# Patient Record
Sex: Female | Born: 1935 | Race: White | Hispanic: No | Smoking: Never smoker
Health system: Southern US, Community
[De-identification: ages and names within clinical notes are randomized; demographics above are authoritative.]

## PROBLEM LIST (undated history)

## (undated) DIAGNOSIS — I10 Essential (primary) hypertension: Secondary | ICD-10-CM

## (undated) DIAGNOSIS — R0602 Shortness of breath: Secondary | ICD-10-CM

## (undated) DIAGNOSIS — I639 Cerebral infarction, unspecified: Secondary | ICD-10-CM

## (undated) DIAGNOSIS — K219 Gastro-esophageal reflux disease without esophagitis: Secondary | ICD-10-CM

## (undated) DIAGNOSIS — I2510 Atherosclerotic heart disease of native coronary artery without angina pectoris: Secondary | ICD-10-CM

## (undated) DIAGNOSIS — R079 Chest pain, unspecified: Secondary | ICD-10-CM

## (undated) HISTORY — DX: Chest pain, unspecified: R07.9

## (undated) HISTORY — PX: OTHER SURGICAL HISTORY: SHX169

## (undated) HISTORY — PX: ABDOMINAL HYSTERECTOMY: SHX81

## (undated) HISTORY — PX: ESOPHAGOGASTRODUODENOSCOPY: SHX1529

---

## 2011-09-05 DIAGNOSIS — H3530 Unspecified macular degeneration: Secondary | ICD-10-CM | POA: Diagnosis not present

## 2011-09-05 DIAGNOSIS — H353190 Nonexudative age-related macular degeneration, unspecified eye, stage unspecified: Secondary | ICD-10-CM | POA: Diagnosis not present

## 2011-09-24 DIAGNOSIS — J069 Acute upper respiratory infection, unspecified: Secondary | ICD-10-CM | POA: Diagnosis not present

## 2011-09-24 DIAGNOSIS — R197 Diarrhea, unspecified: Secondary | ICD-10-CM | POA: Diagnosis not present

## 2011-09-25 DIAGNOSIS — R197 Diarrhea, unspecified: Secondary | ICD-10-CM | POA: Diagnosis not present

## 2011-10-02 DIAGNOSIS — R197 Diarrhea, unspecified: Secondary | ICD-10-CM | POA: Diagnosis not present

## 2011-10-09 ENCOUNTER — Other Ambulatory Visit: Payer: Self-pay

## 2011-10-09 ENCOUNTER — Inpatient Hospital Stay (HOSPITAL_COMMUNITY)
Admission: EM | Admit: 2011-10-09 | Discharge: 2011-10-13 | DRG: 378 | Disposition: A | Discharge status: Skilled Nursing Facility | Payer: Medicare Other | Attending: Internal Medicine | Admitting: Internal Medicine

## 2011-10-09 ENCOUNTER — Encounter (HOSPITAL_COMMUNITY): Payer: Self-pay

## 2011-10-09 DIAGNOSIS — K922 Gastrointestinal hemorrhage, unspecified: Principal | ICD-10-CM | POA: Diagnosis present

## 2011-10-09 DIAGNOSIS — D72829 Elevated white blood cell count, unspecified: Secondary | ICD-10-CM | POA: Diagnosis present

## 2011-10-09 DIAGNOSIS — R1110 Vomiting, unspecified: Secondary | ICD-10-CM | POA: Diagnosis present

## 2011-10-09 DIAGNOSIS — R112 Nausea with vomiting, unspecified: Secondary | ICD-10-CM | POA: Diagnosis not present

## 2011-10-09 DIAGNOSIS — K222 Esophageal obstruction: Secondary | ICD-10-CM | POA: Diagnosis present

## 2011-10-09 DIAGNOSIS — R5383 Other fatigue: Secondary | ICD-10-CM | POA: Diagnosis not present

## 2011-10-09 DIAGNOSIS — R262 Difficulty in walking, not elsewhere classified: Secondary | ICD-10-CM | POA: Diagnosis not present

## 2011-10-09 DIAGNOSIS — E871 Hypo-osmolality and hyponatremia: Secondary | ICD-10-CM | POA: Diagnosis present

## 2011-10-09 DIAGNOSIS — D649 Anemia, unspecified: Secondary | ICD-10-CM | POA: Diagnosis not present

## 2011-10-09 DIAGNOSIS — R0602 Shortness of breath: Secondary | ICD-10-CM | POA: Diagnosis not present

## 2011-10-09 DIAGNOSIS — Z860100 Personal history of colon polyps, unspecified: Secondary | ICD-10-CM

## 2011-10-09 DIAGNOSIS — D473 Essential (hemorrhagic) thrombocythemia: Secondary | ICD-10-CM | POA: Diagnosis present

## 2011-10-09 DIAGNOSIS — Z9181 History of falling: Secondary | ICD-10-CM | POA: Diagnosis not present

## 2011-10-09 DIAGNOSIS — R197 Diarrhea, unspecified: Secondary | ICD-10-CM | POA: Diagnosis present

## 2011-10-09 DIAGNOSIS — R059 Cough, unspecified: Secondary | ICD-10-CM | POA: Diagnosis not present

## 2011-10-09 DIAGNOSIS — I10 Essential (primary) hypertension: Secondary | ICD-10-CM | POA: Diagnosis present

## 2011-10-09 DIAGNOSIS — I2510 Atherosclerotic heart disease of native coronary artery without angina pectoris: Secondary | ICD-10-CM | POA: Diagnosis present

## 2011-10-09 DIAGNOSIS — Z66 Do not resuscitate: Secondary | ICD-10-CM | POA: Diagnosis present

## 2011-10-09 DIAGNOSIS — Z5189 Encounter for other specified aftercare: Secondary | ICD-10-CM | POA: Diagnosis not present

## 2011-10-09 DIAGNOSIS — R079 Chest pain, unspecified: Secondary | ICD-10-CM | POA: Diagnosis not present

## 2011-10-09 DIAGNOSIS — K921 Melena: Secondary | ICD-10-CM | POA: Diagnosis not present

## 2011-10-09 DIAGNOSIS — I498 Other specified cardiac arrhythmias: Secondary | ICD-10-CM | POA: Diagnosis present

## 2011-10-09 DIAGNOSIS — M6281 Muscle weakness (generalized): Secondary | ICD-10-CM | POA: Diagnosis not present

## 2011-10-09 DIAGNOSIS — R799 Abnormal finding of blood chemistry, unspecified: Secondary | ICD-10-CM

## 2011-10-09 DIAGNOSIS — D75839 Thrombocytosis, unspecified: Secondary | ICD-10-CM | POA: Diagnosis present

## 2011-10-09 DIAGNOSIS — R5381 Other malaise: Secondary | ICD-10-CM | POA: Diagnosis not present

## 2011-10-09 DIAGNOSIS — K219 Gastro-esophageal reflux disease without esophagitis: Secondary | ICD-10-CM | POA: Diagnosis present

## 2011-10-09 DIAGNOSIS — R531 Weakness: Secondary | ICD-10-CM | POA: Diagnosis present

## 2011-10-09 DIAGNOSIS — D62 Acute posthemorrhagic anemia: Secondary | ICD-10-CM | POA: Diagnosis present

## 2011-10-09 DIAGNOSIS — E785 Hyperlipidemia, unspecified: Secondary | ICD-10-CM | POA: Diagnosis present

## 2011-10-09 DIAGNOSIS — R195 Other fecal abnormalities: Secondary | ICD-10-CM | POA: Diagnosis not present

## 2011-10-09 DIAGNOSIS — I2541 Coronary artery aneurysm: Secondary | ICD-10-CM | POA: Diagnosis not present

## 2011-10-09 HISTORY — DX: Shortness of breath: R06.02

## 2011-10-09 HISTORY — DX: Atherosclerotic heart disease of native coronary artery without angina pectoris: I25.10

## 2011-10-09 HISTORY — DX: Essential (primary) hypertension: I10

## 2011-10-09 HISTORY — DX: Gastro-esophageal reflux disease without esophagitis: K21.9

## 2011-10-09 LAB — CBC
HCT: 34 % — ABNORMAL LOW (ref 36.0–46.0)
Hemoglobin: 11.1 g/dL — ABNORMAL LOW (ref 12.0–15.0)
Hemoglobin: 8.6 g/dL — ABNORMAL LOW (ref 12.0–15.0)
MCH: 30.2 pg (ref 26.0–34.0)
MCH: 29.9 pg (ref 26.0–34.0)
MCHC: 32.6 g/dL (ref 30.0–36.0)
MCHC: 32.7 g/dL (ref 30.0–36.0)
MCV: 92.4 fL (ref 78.0–100.0)
MCV: 91.3 fL (ref 78.0–100.0)
Platelets: 542 10*3/uL — ABNORMAL HIGH (ref 150–400)
Platelets: 474 10*3/uL — ABNORMAL HIGH (ref 150–400)
RBC: 3.68 MIL/uL — ABNORMAL LOW (ref 3.87–5.11)
RBC: 2.88 MIL/uL — ABNORMAL LOW (ref 3.87–5.11)
RDW: 14.5 % (ref 11.5–15.5)
WBC: 16.5 10*3/uL — ABNORMAL HIGH (ref 4.0–10.5)

## 2011-10-09 LAB — COMPREHENSIVE METABOLIC PANEL
ALT: 9 U/L (ref 0–35)
AST: 14 U/L (ref 0–37)
Albumin: 3.1 g/dL — ABNORMAL LOW (ref 3.5–5.2)
Alkaline Phosphatase: 57 U/L (ref 39–117)
BUN: 50 mg/dL — ABNORMAL HIGH (ref 6–23)
CO2: 22 mEq/L (ref 19–32)
Calcium: 9.1 mg/dL (ref 8.4–10.5)
Chloride: 97 mEq/L (ref 96–112)
Creatinine, Ser: 0.83 mg/dL (ref 0.50–1.10)
GFR calc Af Amer: 74 mL/min — ABNORMAL LOW (ref >90)
GFR calc non Af Amer: 63 mL/min — ABNORMAL LOW (ref >90)
Glucose, Bld: 111 mg/dL — ABNORMAL HIGH (ref 70–99)
Potassium: 4.5 mEq/L (ref 3.5–5.1)
Sodium: 131 mEq/L — ABNORMAL LOW (ref 135–145)
Total Bilirubin: 0.2 mg/dL — ABNORMAL LOW (ref 0.3–1.2)
Total Protein: 6.6 g/dL (ref 6.0–8.3)

## 2011-10-09 LAB — URINALYSIS, ROUTINE W REFLEX MICROSCOPIC
Bilirubin Urine: NEGATIVE
Glucose, UA: NEGATIVE mg/dL
Ketones, ur: NEGATIVE mg/dL
Nitrite: NEGATIVE
Protein, ur: NEGATIVE mg/dL
Specific Gravity, Urine: 1.012 (ref 1.005–1.030)
Urobilinogen, UA: 0.2 mg/dL (ref 0.0–1.0)
pH: 6 (ref 5.0–8.0)

## 2011-10-09 LAB — ABO/RH: ABO/RH(D): A POS

## 2011-10-09 LAB — URINE MICROSCOPIC-ADD ON

## 2011-10-09 LAB — CARDIAC PANEL(CRET KIN+CKTOT+MB+TROPI): Troponin I: <0.3 ng/mL (ref <0.30)

## 2011-10-09 MED ORDER — BIOTENE DRY MOUTH MT LIQD
15.0000 mL | Freq: Two times a day (BID) | OROMUCOSAL | Status: DC
  Administered 2011-10-09 – 2011-10-13 (×8): 15 mL via OROMUCOSAL

## 2011-10-09 MED ORDER — ALPRAZOLAM 0.25 MG PO TABS
0.2500 mg | ORAL_TABLET | Freq: Three times a day (TID) | ORAL | Status: DC | PRN
  Administered 2011-10-11 – 2011-10-12 (×2): 0.25 mg via ORAL
  Filled 2011-10-09 (×2): qty 1

## 2011-10-09 MED ORDER — ESCITALOPRAM OXALATE 20 MG PO TABS
20.0000 mg | ORAL_TABLET | Freq: Every morning | ORAL | Status: DC
  Administered 2011-10-11 – 2011-10-13 (×2): 20 mg via ORAL
  Filled 2011-10-09 (×4): qty 1

## 2011-10-09 MED ORDER — METOPROLOL TARTRATE 1 MG/ML IV SOLN
2.5000 mg | Freq: Once | INTRAVENOUS | Status: AC
  Administered 2011-10-09: 2.5 mg via INTRAVENOUS
  Filled 2011-10-09: qty 5

## 2011-10-09 MED ORDER — SODIUM CHLORIDE 0.9 % IV SOLN
INTRAVENOUS | Status: DC
  Administered 2011-10-10 – 2011-10-12 (×3): via INTRAVENOUS

## 2011-10-09 MED ORDER — NITROGLYCERIN 0.4 MG SL SUBL
0.4000 mg | SUBLINGUAL_TABLET | SUBLINGUAL | Status: DC | PRN
  Administered 2011-10-09: 0.4 mg via SUBLINGUAL

## 2011-10-09 MED ORDER — SIMVASTATIN 20 MG PO TABS
20.0000 mg | ORAL_TABLET | Freq: Every evening | ORAL | Status: DC
  Administered 2011-10-09: 20 mg via ORAL
  Filled 2011-10-09: qty 1

## 2011-10-09 MED ORDER — METOPROLOL TARTRATE 25 MG PO TABS
75.0000 mg | ORAL_TABLET | Freq: Every day | ORAL | Status: DC
Start: 2011-10-09 — End: 2011-10-09
  Administered 2011-10-09: 75 mg via ORAL
  Filled 2011-10-09: qty 1

## 2011-10-09 MED ORDER — NITROGLYCERIN 0.4 MG SL SUBL
SUBLINGUAL_TABLET | SUBLINGUAL | Status: AC
  Filled 2011-10-09: qty 25

## 2011-10-09 MED ORDER — ISOSORBIDE MONONITRATE ER 60 MG PO TB24
60.0000 mg | ORAL_TABLET | Freq: Every day | ORAL | Status: DC
  Administered 2011-10-09: 60 mg via ORAL
  Filled 2011-10-09 (×2): qty 1

## 2011-10-09 MED ORDER — DILTIAZEM HCL ER COATED BEADS 120 MG PO CP24
120.0000 mg | ORAL_CAPSULE | Freq: Every day | ORAL | Status: DC
  Administered 2011-10-09: 120 mg via ORAL
  Filled 2011-10-09 (×2): qty 1

## 2011-10-09 MED ORDER — SODIUM CHLORIDE 0.9 % IV SOLN
8.0000 mg/h | INTRAVENOUS | Status: DC
  Administered 2011-10-09 – 2011-10-11 (×4): 8 mg/h via INTRAVENOUS
  Filled 2011-10-09 (×9): qty 80

## 2011-10-09 MED ORDER — TIMOLOL MALEATE 0.5 % OP SOLG
1.0000 [drp] | Freq: Every day | OPHTHALMIC | Status: DC
  Administered 2011-10-09 – 2011-10-13 (×4): 1 [drp] via OPHTHALMIC
  Filled 2011-10-09: qty 5

## 2011-10-09 MED ORDER — ALUM & MAG HYDROXIDE-SIMETH 200-200-20 MG/5ML PO SUSP: 30.0000 mL | Freq: Four times a day (QID) | ORAL | Status: DC | PRN

## 2011-10-09 MED ORDER — CYCLOBENZAPRINE HCL 10 MG PO TABS
10.0000 mg | ORAL_TABLET | Freq: Three times a day (TID) | ORAL | Status: DC | PRN
  Administered 2011-10-11 – 2011-10-13 (×2): 10 mg via ORAL
  Filled 2011-10-09 (×2): qty 1

## 2011-10-09 MED ORDER — METOPROLOL TARTRATE 50 MG PO TABS
50.0000 mg | ORAL_TABLET | Freq: Two times a day (BID) | ORAL | Status: DC
  Filled 2011-10-09 (×2): qty 1

## 2011-10-09 MED ORDER — PANTOPRAZOLE SODIUM 40 MG IV SOLR
40.0000 mg | Freq: Once | INTRAVENOUS | Status: AC
  Administered 2011-10-09: 40 mg via INTRAVENOUS
  Filled 2011-10-09: qty 40

## 2011-10-09 MED ORDER — MORPHINE SULFATE 2 MG/ML IJ SOLN
2.0000 mg | INTRAMUSCULAR | Status: DC | PRN
  Administered 2011-10-11: 2 mg via INTRAVENOUS
  Filled 2011-10-09 (×3): qty 1

## 2011-10-09 MED ORDER — SODIUM CHLORIDE 0.9 % IV BOLUS (SEPSIS)
1000.0000 mL | Freq: Once | INTRAVENOUS | Status: AC
  Administered 2011-10-09: 1000 mL via INTRAVENOUS

## 2011-10-09 MED ORDER — MORPHINE SULFATE 2 MG/ML IJ SOLN
INTRAMUSCULAR | Status: AC
  Administered 2011-10-09: 2 mg via INTRAVENOUS
  Filled 2011-10-09: qty 1

## 2011-10-09 MED ORDER — ACETAMINOPHEN 650 MG RE SUPP: 650.0000 mg | Freq: Four times a day (QID) | RECTAL | Status: DC | PRN

## 2011-10-09 MED ORDER — ONDANSETRON HCL 4 MG/2ML IJ SOLN: 4.0000 mg | Freq: Four times a day (QID) | INTRAMUSCULAR | Status: DC | PRN

## 2011-10-09 MED ORDER — SODIUM CHLORIDE 0.9 % IJ SOLN
3.0000 mL | Freq: Two times a day (BID) | INTRAMUSCULAR | Status: DC
  Administered 2011-10-11 (×2): 3 mL via INTRAVENOUS

## 2011-10-09 MED ORDER — ATORVASTATIN CALCIUM 10 MG PO TABS
10.0000 mg | ORAL_TABLET | Freq: Every day | ORAL | Status: DC
  Administered 2011-10-10 – 2011-10-12 (×3): 10 mg via ORAL
  Filled 2011-10-09 (×4): qty 1

## 2011-10-09 MED ORDER — ONDANSETRON HCL 4 MG PO TABS
4.0000 mg | ORAL_TABLET | Freq: Four times a day (QID) | ORAL | Status: DC | PRN
  Administered 2011-10-10: 4 mg via ORAL
  Filled 2011-10-09: qty 1

## 2011-10-09 MED ORDER — TRAVOPROST 0.004 % OP SOLN: 1.0000 [drp] | Freq: Every day | OPHTHALMIC | Status: DC

## 2011-10-09 MED ORDER — ALLOPURINOL 100 MG PO TABS
100.0000 mg | ORAL_TABLET | Freq: Every day | ORAL | Status: DC
  Administered 2011-10-09 – 2011-10-12 (×4): 100 mg via ORAL
  Filled 2011-10-09 (×5): qty 1

## 2011-10-09 MED ORDER — ACETAMINOPHEN 325 MG PO TABS
650.0000 mg | ORAL_TABLET | Freq: Four times a day (QID) | ORAL | Status: DC | PRN
  Administered 2011-10-10 – 2011-10-13 (×4): 650 mg via ORAL
  Filled 2011-10-09 (×4): qty 2

## 2011-10-09 MED ORDER — TRAVOPROST (BAK FREE) 0.004 % OP SOLN
1.0000 [drp] | Freq: Every day | OPHTHALMIC | Status: DC
  Administered 2011-10-09 – 2011-10-12 (×4): 1 [drp] via OPHTHALMIC
  Filled 2011-10-09: qty 2.5

## 2011-10-09 NOTE — ED Notes (Signed)
REPORT CALLED TO JESSE 

## 2011-10-09 NOTE — ED Notes (Signed)
Pt reports dark tarry stools x2 months, black diarrhea x2 wks, black vomitus this am, and generalized weakness x2 months, pt reports seeing her pcp today and instructed to come here for dehydration. Pt denies any chest pain or sob

## 2011-10-09 NOTE — Progress Notes (Signed)
Observation review is complete. 

## 2011-10-09 NOTE — ED Notes (Signed)
Patient placed on monitor and is resting with NAD at this time.

## 2011-10-09 NOTE — Progress Notes (Signed)
PHARMACIST - PHYSICIAN COMMUNICATION DR:   Marlin Canary CONCERNING:  Zocor (Simvastatin) 20mg  daily and Diltiazem. (on PTA)  DESCRIPTION: Patients on diltiazem and simvastatin >10 mg/day have reported cases of rhabdomyolysis.    RECOMMENDATION: Per [facility] Formulary and Pharmacy and Therapeutics Committee approved substitution policy we are substituting Atorvastatin (Lipitor) 10 mg for Zocor 20mg .  Please consider this substituition at discharge as patient was taking Zocor 20mg  and Diltiazem prior to admission.   Noah Delaine, RPh Clinical Pharmacist 10/09/2011, 21:11

## 2011-10-09 NOTE — ED Provider Notes (Signed)
History    78yF with diarrhea and vomiting. Patient states that she's had diarrhea for at least several weeks which has been black and in the words of her son "tarry" appearing. Patient also with black emesis today as well. Patient is feeling a little bit dizzy and generally weak. Denies abdominal pain. No fevers or chills. No shortness of breath. Patient has been evaluated by her PCP for these complaints. Has been taking Imodium without much change. Patient was on omeprazole that was recently started this is felt to might be contributing to her diarrhea. No history GI bleed that the patient is aware of.  CSN: [id number]  Arrival date & time 10/09/11  1305   First MD Initiated Contact with Patient 10/09/11 1428      Chief Complaint  Patient presents with  . Dehydration    Chronic diarrhea ,black . Pt reports vomiting  black colored liquid to day    (Consider location/radiation/quality/duration/timing/severity/associated sxs/prior treatment) HPI  No past medical history on file.  No past surgical history on file.  No family history on file.  History  Substance Use Topics  . Smoking status: Not on file  . Smokeless tobacco: Not on file  . Alcohol Use: Not on file    OB History    No data available      Review of Systems   Review of symptoms negative unless otherwise noted in HPI.   Allergies  Review of patient's allergies indicates no known allergies.  Home Medications   Current Outpatient Rx  Name Route Sig Dispense Refill  . ALLOPURINOL 100 MG PO TABS Oral Take 100 mg by mouth at bedtime.    . ALPRAZOLAM 0.25 MG PO TABS Oral Take 0.25 mg by mouth 3 (three) times daily as needed. As needed for anxiety.    . ASPIRIN 325 MG PO TABS Oral Take 325 mg by mouth daily at 12 noon.    Marland Kitchen CALCIUM CARBONATE-VITAMIN D 600-200 MG-UNIT PO TABS Oral Take 1 tablet by mouth 2 (two) times daily.    . CYCLOBENZAPRINE HCL 10 MG PO TABS Oral Take 10 mg by mouth 3 (three) times daily as  needed. As needed for anxiety.    Marland Kitchen DILTIAZEM HCL ER COATED BEADS 120 MG PO CP24 Oral Take 120 mg by mouth daily at 12 noon.    Marland Kitchen ESCITALOPRAM OXALATE 20 MG PO TABS Oral Take 20 mg by mouth every morning.    . ISOSORBIDE MONONITRATE ER 60 MG PO TB24 Oral Take 60 mg by mouth daily at 12 noon.    . LUTEIN 20 MG PO TABS Oral Take 1 tablet by mouth every morning.    Marland Kitchen METOPROLOL TARTRATE 50 MG PO TABS Oral Take 75 mg by mouth daily at 12 noon.    . CENTRUM SILVER PO Oral Take 1 tablet by mouth daily at 12 noon.    Marland Kitchen NITROGLYCERIN 0.4 MG SL SUBL Sublingual Place 0.4 mg under the tongue every 5 (five) minutes as needed. As needed for chest pain.    Marland Kitchen PANTOPRAZOLE SODIUM 40 MG PO TBEC Oral Take 40 mg by mouth daily.    Marland Kitchen SIMVASTATIN 20 MG PO TABS Oral Take 20 mg by mouth every evening.    Marland Kitchen TIMOLOL MALEATE 0.5 % OP SOLG Both Eyes Place 1 drop into both eyes daily.    . TRAVOPROST 0.004 % OP SOLN Both Eyes Place 1 drop into both eyes at bedtime.    . TRAZODONE HCL 50 MG PO  TABS Oral Take 150 mg by mouth at bedtime.      BP 149/73  Pulse 118  Temp(Src) 97.2 F (36.2 C) (Oral)  Resp 16  SpO2 99%  Physical Exam  Nursing note and vitals reviewed. Constitutional: She appears well-developed and well-nourished. No distress.       In bed. No acute distress.  HENT:  Head: Normocephalic and atraumatic.  Eyes: Conjunctivae are normal. Pupils are equal, round, and reactive to light. Right eye exhibits no discharge. Left eye exhibits no discharge.  Neck: Neck supple.  Cardiovascular: Regular rhythm and normal heart sounds.  Exam reveals no gallop and no friction rub.   No murmur heard.      Tachycardic with a regular rhythm  Pulmonary/Chest: Effort normal and breath sounds normal. No respiratory distress.  Abdominal: Soft. She exhibits no distension. There is no tenderness.       No tenderness. No distention. No masses palpated  Musculoskeletal: She exhibits no edema and no tenderness.  Neurological:  She is alert.  Skin: Skin is warm and dry. She is not diaphoretic.  Psychiatric: She has a normal mood and affect. Her behavior is normal. Thought content normal.    ED Course  Procedures (including critical care time)  Labs Reviewed - No data to display No results found.   1. Melena   2. Elevated BUN   3. Diarrhea       MDM  78 year old female with diarrhea and emesis. Symptoms concerning for ongoing bleeding. Patient is mildly tachycardic. BUN is markedly elevated possibly secondary to GI bleed and/or complaint dehydration. IV fluids. Type and screen. Admission for further evaluation.        Raeford Razor, MD 10/11/11 [phone]

## 2011-10-09 NOTE — ED Notes (Signed)
Diarrhea now black and has vomited Black liquid. Pt took 6  Imodium  With out relief.

## 2011-10-09 NOTE — Progress Notes (Signed)
Called by RN to inform me that patient complaining of 4/10 chest pain and HR going up to 150 possible SVT vs A.flutter with block. Spoke to family her son stated he wants no life prolonging measures but concentrating on conservative approach. Patient is to be DNR/DNI. He did not want any aggressive procedures done. Will give morphine and attempt to improve heart rate with lopressor IV. For now would avoid adenosine as per family wishes.  After lopressor 2.5 IV HR  went down to 104, will continue to monitor. Changed scheduled lopressor to 50 BID from 75 qd.  CE pending but given patient disposition and family wishes if CE are positive will trend them but avoid aggressive interventions.  Chart including problem list, medications, labs and vitals were reviewed  Tura Roller 8:22 PM

## 2011-10-09 NOTE — ED Notes (Signed)
0454-09 Ready

## 2011-10-09 NOTE — H&P (Addendum)
Patient's PCP: Elby Showers, MD, MD  Chief Complaint: weakness/diarrhea (chronic)  History of Present Illness: Ellen Marshall is a 78 y.o. white female from [facility] who follows with Dr. Clent Ridges but has been seeing Dr. Denton Lank and Dr. Earl Gala recently.  She recently moved here from out of town and son is at the bedside to add history.  For months patient has had diarrhea (occasionaly black in nature).  She has been seen by her PCP who suspected microscopic colitis but no biopsy has been done.   Had colonoscopy done 6 years ago with nothing remarkable. She also has a long history of esophageal stricture requiring stretching.  Her PCP has been working it up her diarrhea as an outpatient, protonix was stopped about a week ago as it was suspected to contribute to her diarrhea.  She denies history of ulcer or other GI bleed.    Again, she presents with diarrhea (chronic) black in nature for months, and vomiting/regurgitation of food for 1 day.    Weakness seems to be the most important problem to family and this has been worsening over the last few months.  Patient is now ambulating with walker but not very active.      Meds: Scheduled Meds:    . pantoprazole (PROTONIX) IV  40 mg Intravenous Once  . sodium chloride  1,000 mL Intravenous Once   Continuous Infusions:    . pantoprozole (PROTONIX) infusion 8 mg/hr (10/09/11 1710)   PRN Meds:. Allergies: Review of patient's allergies indicates no known allergies. Past Medical History  Diagnosis Date  . Coronary artery disease    Past Surgical History  Procedure Date  . Colonscopy   . Esophagogastroduodenoscopy   . Stretching of egd    No family history on file. History   Social History  . Marital Status: Widowed    Spouse Name: N/A    Number of Children: N/A  . Years of Education: N/A   Occupational History  . Not on file.   Social History Main Topics  . Smoking status: Never Smoker   . Smokeless tobacco: Not on file    . Alcohol Use: No  . Drug Use: No  . Sexually Active:    Other Topics Concern  . Not on file   Social History Narrative  . No narrative on file   Review of Systems: All systems reviewed with the patient and positive as per history of present illness, otherwise all other systems are negative.   Physical Exam: Blood pressure 145/101, pulse 111, temperature 97.5 F (36.4 C), temperature source Oral, resp. rate 23, SpO2 99.00%. General: Awake, Oriented x3, No acute distress, elderly HEENT: EOMI, dry mucous membranes Neck: Supple CV: S1 and S2, tachy Lungs: Clear to ascultation bilaterally, no wheezing Abdomen: Soft, Nontender, Nondistended, +bowel sounds. Ext: Good pulses. Trace edema. No clubbing or cyanosis noted. Neuro: Cranial Nerves II-XII grossly intact. generalized weakness    Lab results:  Basename 10/09/11 1443  NA 131*  K 4.5  CL 97  CO2 22  GLUCOSE 111*  BUN 50*  CREATININE 0.83  CALCIUM 9.1  MG --  PHOS --    Basename 10/09/11 1443  AST 14  ALT 9  ALKPHOS 57  BILITOT 0.2*  PROT 6.6  ALBUMIN 3.1*   No results found for this basename: LIPASE:2,AMYLASE:2 in the last 72 hours  Basename 10/09/11 1443  WBC 16.5*  NEUTROABS --  HGB 11.1*  HCT 34.0*  MCV 92.4  PLT 542*   No results found  for this basename: CKTOTAL:3,CKMB:3,CKMBINDEX:3,TROPONINI:3 in the last 72 hours No components found with this basename: POCBNP:3 No results found for this basename: DDIMER in the last 72 hours No results found for this basename: HGBA1C:2 in the last 72 hours No results found for this basename: CHOL:2,HDL:2,LDLCALC:2,TRIG:2,CHOLHDL:2,LDLDIRECT:2 in the last 72 hours No results found for this basename: TSH,T4TOTAL,FREET3,T3FREE,THYROIDAB in the last 72 hours No results found for this basename: VITAMINB12:2,FOLATE:2,FERRITIN:2,TIBC:2,IRON:2,RETICCTPCT:2 in the last 72 hours  Imaging results:  No results found.  Other results: EKG: sinus tach  Assessment &  Plan by Problem:   *Weakness- await U/A to rule out UTI, consult PT  ?GI bleed- send stool for heme, CBC q 12 hours, consult GI, has has black diarrhea on and off for years   Hyponatremia- ? Dehydration   Diarrhea- chronic issue that has been worked up as an outpatient, will consult GI   Vomiting- zofran PRN, sounds more like regurgitation/gagging   Leukocytosis- ? Etiology, wait U/A   Thrombocythemia- watch   GERD (gastroesophageal reflux disease)- protonix   CAD (coronary artery disease)- stable ASA/? plavix (not listed on home meds)  Tachycardia- IVF   Code status: no intubation but CPR ok (no feeding tubes)  Time spent on admission, talking to the patient, and coordinating care was: 59 mins.  Doniqua Saxby, DO 10/09/2011, 5:24 PM

## 2011-10-09 NOTE — Progress Notes (Signed)
Patient's EKG showed SVT HR 152 that was obtained at 1950. Dr. Adela Glimpse notified. Will continue to monitor patient. Nelda Marseille, RN

## 2011-10-09 NOTE — ED Provider Notes (Signed)
3:53 PM  Date: 10/09/2011  Rate: 114  Rhythm: sinus tachycardia  QRS Axis: left  Intervals: normal QRS:  Poor R wave progression in precordial leads suggests possible old anterior myocardial infarction.  ST/T Wave abnormalities: normal  Conduction Disutrbances:none  Narrative Interpretation: Abnormal EKG.  Old EKG Reviewed: none available    Carleene Cooper III, MD 10/09/11 [phone]

## 2011-10-09 NOTE — Progress Notes (Signed)
Patient's HR in the 150's sustained at 1950. Rapid response RN, Wes at bedside.  Wes, RN notified Dr. Adela Glimpse of situation. Patient stated that she reached down to take pants off.  Patient having chest pain.Gave one nitro subliginal. No chest pain after nitro.   Dr. Adela Glimpse stated that she would come look at patient.  Patient having chest pain again at 4 and problems breathing when Dr. Adela Glimpse arrived. Put patient on 2L of O2.  Dr. Adela Glimpse gave order for Metoprol 2.5mg  IV and Morphine 2mg  IV.  Patient having no pain now and HR is 105 on tele. Will continue to monitor patient. Nelda Marseille, RN

## 2011-10-10 ENCOUNTER — Encounter (HOSPITAL_COMMUNITY): Payer: Self-pay | Admitting: *Deleted

## 2011-10-10 ENCOUNTER — Encounter (HOSPITAL_COMMUNITY)
Admission: EM | Disposition: A | Discharge status: Skilled Nursing Facility | Payer: Self-pay | Source: Home / Self Care | Attending: Internal Medicine

## 2011-10-10 DIAGNOSIS — R197 Diarrhea, unspecified: Secondary | ICD-10-CM | POA: Diagnosis not present

## 2011-10-10 DIAGNOSIS — I2510 Atherosclerotic heart disease of native coronary artery without angina pectoris: Secondary | ICD-10-CM | POA: Diagnosis not present

## 2011-10-10 DIAGNOSIS — E871 Hypo-osmolality and hyponatremia: Secondary | ICD-10-CM | POA: Diagnosis not present

## 2011-10-10 DIAGNOSIS — K921 Melena: Secondary | ICD-10-CM | POA: Diagnosis not present

## 2011-10-10 DIAGNOSIS — D62 Acute posthemorrhagic anemia: Secondary | ICD-10-CM | POA: Diagnosis present

## 2011-10-10 DIAGNOSIS — R112 Nausea with vomiting, unspecified: Secondary | ICD-10-CM | POA: Diagnosis not present

## 2011-10-10 DIAGNOSIS — K922 Gastrointestinal hemorrhage, unspecified: Secondary | ICD-10-CM | POA: Diagnosis present

## 2011-10-10 HISTORY — PX: ESOPHAGOGASTRODUODENOSCOPY: SHX5428

## 2011-10-10 LAB — CBC
HCT: 18.7 % — ABNORMAL LOW (ref 36.0–46.0)
HCT: 28.2 % — ABNORMAL LOW (ref 36.0–46.0)
Hemoglobin: 6.2 g/dL — CL (ref 12.0–15.0)
Hemoglobin: 7.6 g/dL — ABNORMAL LOW (ref 12.0–15.0)
Hemoglobin: 9.7 g/dL — ABNORMAL LOW (ref 12.0–15.0)
MCH: 30.2 pg (ref 26.0–34.0)
MCHC: 34.4 g/dL (ref 30.0–36.0)
MCV: 88.5 fL (ref 78.0–100.0)
RBC: 2.03 MIL/uL — ABNORMAL LOW (ref 3.87–5.11)
RBC: 2.52 MIL/uL — ABNORMAL LOW (ref 3.87–5.11)
RDW: 15 % (ref 11.5–15.5)
WBC: 15.9 10*3/uL — ABNORMAL HIGH (ref 4.0–10.5)
WBC: 14.6 10*3/uL — ABNORMAL HIGH (ref 4.0–10.5)
WBC: 17.4 10*3/uL — ABNORMAL HIGH (ref 4.0–10.5)

## 2011-10-10 LAB — BASIC METABOLIC PANEL
Chloride: 109 mEq/L (ref 96–112)
GFR calc Af Amer: 75 mL/min — ABNORMAL LOW (ref >90)
GFR calc non Af Amer: 64 mL/min — ABNORMAL LOW (ref >90)
Potassium: 4.2 mEq/L (ref 3.5–5.1)
Sodium: 137 mEq/L (ref 135–145)

## 2011-10-10 LAB — PREPARE RBC (CROSSMATCH)

## 2011-10-10 LAB — OCCULT BLOOD X 1 CARD TO LAB, STOOL: Fecal Occult Bld: POSITIVE

## 2011-10-10 LAB — CARDIAC PANEL(CRET KIN+CKTOT+MB+TROPI)
Relative Index: INVALID (ref 0.0–2.5)
Relative Index: 4.1 — ABNORMAL HIGH (ref 0.0–2.5)
Troponin I: <0.3 ng/mL (ref <0.30)
Troponin I: <0.3 ng/mL (ref <0.30)

## 2011-10-10 LAB — VITAMIN B12: Vitamin B-12: 449 pg/mL (ref 211–911)

## 2011-10-10 SURGERY — EGD (ESOPHAGOGASTRODUODENOSCOPY)
Anesthesia: Moderate Sedation

## 2011-10-10 MED ORDER — METOPROLOL TARTRATE 25 MG PO TABS
25.0000 mg | ORAL_TABLET | Freq: Two times a day (BID) | ORAL | Status: DC
  Administered 2011-10-10 – 2011-10-13 (×6): 25 mg via ORAL
  Filled 2011-10-10 (×7): qty 1

## 2011-10-10 MED ORDER — BUTAMBEN-TETRACAINE-BENZOCAINE 2-2-14 % EX AERO
INHALATION_SPRAY | CUTANEOUS | Status: DC | PRN
  Administered 2011-10-10: 2 via TOPICAL

## 2011-10-10 MED ORDER — SODIUM CHLORIDE 0.9 % IV SOLN
500.0000 mL | Freq: Once | INTRAVENOUS | Status: AC
  Administered 2011-10-10: 500 mL via INTRAVENOUS

## 2011-10-10 MED ORDER — DIPHENHYDRAMINE HCL 25 MG PO CAPS
25.0000 mg | ORAL_CAPSULE | Freq: Once | ORAL | Status: AC
  Administered 2011-10-10: 25 mg via ORAL
  Filled 2011-10-10: qty 1

## 2011-10-10 MED ORDER — MIDAZOLAM HCL 10 MG/2ML IJ SOLN
INTRAMUSCULAR | Status: AC
  Filled 2011-10-10: qty 2

## 2011-10-10 MED ORDER — ACETAMINOPHEN 325 MG PO TABS
650.0000 mg | ORAL_TABLET | Freq: Once | ORAL | Status: AC
  Administered 2011-10-10: 650 mg via ORAL
  Filled 2011-10-10: qty 2

## 2011-10-10 MED ORDER — MIDAZOLAM HCL 10 MG/2ML IJ SOLN
INTRAMUSCULAR | Status: DC | PRN
  Administered 2011-10-10: 1 mg via INTRAVENOUS
  Administered 2011-10-10 (×2): 2 mg via INTRAVENOUS

## 2011-10-10 MED ORDER — FENTANYL CITRATE 0.05 MG/ML IJ SOLN
INTRAMUSCULAR | Status: AC
  Filled 2011-10-10: qty 2

## 2011-10-10 MED ORDER — FENTANYL NICU IV SYRINGE 50 MCG/ML
INJECTION | INTRAMUSCULAR | Status: DC | PRN
  Administered 2011-10-10 (×2): 25 ug via INTRAVENOUS

## 2011-10-10 NOTE — Interval H&P Note (Signed)
History and Physical Interval Note:  10/10/2011 10:29 AM  Ellen Marshall  has presented today for surgery, with the diagnosis of gi bleeding  The various methods of treatment have been discussed with the patient and family. After consideration of risks, benefits and other options for treatment, the patient has consented to  Procedure(s) (LRB): ESOPHAGOGASTRODUODENOSCOPY (EGD) (N/A) as a surgical intervention .  The patients' history has been reviewed, patient examined, no change in status, stable for surgery.  I have reviewed the patients' chart and labs.  Questions were answered to the patient's satisfaction.     Jailani Hogans JR,Macenzie Burford L

## 2011-10-10 NOTE — Evaluation (Signed)
Physical Therapy Evaluation Patient Details Name: Ellen Marshall MRN: [id number] DOB: March 07, 1926 Today's Date: 10/10/2011  Problem List:  Patient Active Problem List  Diagnoses  . Hyponatremia  . Diarrhea  . Vomiting  . Weakness  . Leukocytosis  . Thrombocythemia  . GERD (gastroesophageal reflux disease)  . CAD (coronary artery disease)    Past Medical History:  Past Medical History  Diagnosis Date  . Coronary artery disease   . Hypertension   . GERD (gastroesophageal reflux disease)   . Shortness of breath     laying down   Past Surgical History:  Past Surgical History  Procedure Date  . Colonscopy   . Esophagogastroduodenoscopy   . Stretching of egd   . Abdominal hysterectomy     PT Assessment/Plan/Recommendation PT Assessment Clinical Impression Statement: Pt adm with weakness and possible GI bleed.  Pt with expected light headedness due to low hemoglobin.  Pt agile and should do well once medical issues begin resolving.  Expect she will be able to return to [facility]. PT Recommendation/Assessment: Patient will need skilled PT in the acute care venue PT Problem List: Decreased strength;Decreased activity tolerance;Decreased balance;Decreased mobility PT Therapy Diagnosis : Generalized weakness;Difficulty walking PT Plan PT Frequency: Min 3X/week PT Treatment/Interventions: DME instruction;Gait training;Functional mobility training;Therapeutic activities;Therapeutic exercise;Balance training;Patient/family education PT Recommendation Follow Up Recommendations: Home health PT Equipment Recommended: None recommended by PT PT Goals  Acute Rehab PT Goals PT Goal Formulation: With patient Time For Goal Achievement: 7 days Pt will go Sit to Stand: with modified independence PT Goal: Sit to Stand - Progress: Goal set today Pt will go Stand to Sit: with modified independence PT Goal: Stand to Sit - Progress: Goal set today Pt will Ambulate: >150 feet;with modified  independence;with least restrictive assistive device PT Goal: Ambulate - Progress: Goal set today  PT Evaluation Precautions/Restrictions  Precautions Precautions: Fall Restrictions Weight Bearing Restrictions: No Prior Functioning  Home Living Lives With: Alone Type of Home: Independent living facility [facility]) Home Layout: One level Home Access: Level entry Bathroom Shower/Tub: Health visitor: Handicapped height Bathroom Accessibility: Yes How Accessible: Accessible via walker Home Adaptive Equipment: Built-in shower seat;Walker - rolling Prior Function Level of Independence: Independent with basic ADLs;Independent with transfers;Independent with homemaking with ambulation;Independent with gait;Requires assistive device for independence Vocation: Retired Financial risk analyst Arousal/Alertness: Awake/alert Overall Cognitive Status: Appears within functional limits for tasks assessed Orientation Level: Oriented X4 Sensation/Coordination   Extremity Assessment RLE Strength RLE Overall Strength Comments: grossly 4/5 LLE Strength LLE Overall Strength Comments: grossly 4/5 Mobility (including Balance) Bed Mobility Bed Mobility: Yes Supine to Sit: 7: Independent;HOB flat Sitting - Scoot to Edge of Bed: 7: Independent Sit to Supine: 7: Independent;HOB flat Transfers Sit to Stand: 4: Min assist;With upper extremity assist;From bed Sit to Stand Details (indicate cue type and reason): Assist for balance Stand to Sit: 4: Min assist;With upper extremity assist (to stretcher) Ambulation/Gait Ambulation/Gait: Yes Ambulation/Gait Assistance: 4: Min assist Ambulation/Gait Assistance Details (indicate cue type and reason): Assist for balance Ambulation Distance (Feet):  (pt slightly dizzy due to low hemoglobin) Assistive device: 1 person hand held assist Gait Pattern: Decreased step length - right;Decreased step length - left (narrow base of support)  Static  Standing Balance Static Standing - Balance Support: Left upper extremity supported (with hand-held) Static Standing - Level of Assistance: 4: Min assist Exercise    End of Session PT - End of Session Activity Tolerance: Other (comment) (limited by light headedness due to low hemoglobin)  Patient left:  (on stretcher to endo) Nurse Communication: Mobility status for ambulation;Mobility status for transfers General Behavior During Session: [facility] for tasks performed Cognition: [facility] for tasks performed  [facility] 10/10/2011, 9:17 AM  [facility] PT [phone]

## 2011-10-10 NOTE — Op Note (Signed)
[facility] [address] [place], [place]  [address]  ENDOSCOPY PROCEDURE REPORT  PATIENT:  Ellen Marshall, Ellen Marshall  MR#:  [id number] BIRTHDATE:  1925/09/16, 85 yrs. old  GENDER:  female  ENDOSCOPIST:  Carman Ching Referred by:  Elby Showers, M.D.  PROCEDURE DATE:  10/10/2011 PROCEDURE:  EGD, diagnostic 43235 ASA CLASS:  Class III INDICATIONS:  melenic bleeding  MEDICATIONS:   Fentanyl 50 mcg IV, Versed 5 mg IV TOPICAL ANESTHETIC:  Cetacaine Spray  DESCRIPTION OF PROCEDURE:   After the risks and benefits of the procedure were explained, informed consent was obtained.  The Pentax Gastroscope [id number] endoscope was introduced through the mouth and advanced to the second portion of the duodenum.  The instrument was slowly withdrawn as the mucosa was fully examined. <<PROCEDUREIMAGES>>  Blood was found in the body and the antrum of the stomach. there was old blood and clots along the greater curve that could not be adequately washed. There was no active bleeding in the duodenum or in the stomach. No clear bleeding site was determined.  A stricture was found in the distal esophagus. very mild stricture. The scope easily passed. No varices, esophagitis or esophageal bleeding source identified.    Retroflexed views revealed no abnormalities.    The scope was then withdrawn from the patient and the procedure completed.  COMPLICATIONS:  A complication of none occurred on 10/10/2011 at.  ENDOSCOPIC IMPRESSION: 1) Blood in the body and the antrum of the stomach 2) Stricture in the distal esophagus the patient could be bleeding from a gastric AVM had has been obscured by the old blood in the stomach. RECOMMENDATIONS: 1) Sips clears today otherwise NPO; will readdress tomorrow. follow clinically and if bleeding continues may need to repeat EGD after the stomach has been adequately clear.  ______________________________ Carman Ching  CC:  Elby Showers,  MD  n. Rosalie DoctorCarman Ching at 10/10/2011 11:00 AM  Markus Daft, [phone]

## 2011-10-10 NOTE — Progress Notes (Signed)
Called by RN to inform me that patients Hg dropped to 6.2 she has history of upper Gi bleed and here with tarry stools. Hemoccult positive.  Patient currently on protonix gtt. Will write to transfuse two units. Would recommend having GI consult in am.   Chart including problem list, medications, labs and vitals were reviewed  Ellen Marshall 4:48 AM

## 2011-10-10 NOTE — Progress Notes (Signed)
  Echocardiogram 2D Echocardiogram has been performed.  Ellen Marshall A 10/10/2011, 2:54 PM

## 2011-10-10 NOTE — Progress Notes (Signed)
   CARE MANAGEMENT NOTE 10/10/2011  Patient:  Ellen Marshall, Ellen Marshall   Account Number:  [id number]  Date Initiated:  10/10/2011  Documentation initiated by:  Donn Pierini  Subjective/Objective Assessment:   Pt admitted with weakness- GIB - GI consulted - pt for EGD     Action/Plan:   PTA pt lived at [facility] [place]-  independent with ADLs- PT eval ordered   Anticipated DC Date:  10/11/2011   Anticipated DC Plan:  HOME W HOME HEALTH SERVICES      DC Planning Services  CM consult      Choice offered to / List presented to:             Status of service:  In process, will continue to follow Medicare Important Message given?   (If response is "NO", the following Medicare IM given date fields will be blank) Date Medicare IM given:   Date Additional Medicare IM given:    Discharge Disposition:    Per UR Regulation:    If discussed at Long Length of Stay Meetings, dates discussed:    Comments:  PCP- C. Walsh  10/10/11- 1225- Donn Pierini RN, BSN [phone] Pt from [place] at [facility]- per PT note recommend HH-PT- pt down having EGD- will f/u when pt returns- expect pt to return to [facility] CM to follow for d/c needs/planning

## 2011-10-10 NOTE — Progress Notes (Signed)
Subjective: Had several melanotic stools last night    Physical Exam: Blood pressure 115/61, pulse 74, temperature 98.1 F (36.7 C), temperature source Oral, resp. rate 18, height 5\' 3"  (1.6 m), weight 60.782 kg (134 lb), SpO2 100.00%. Alert and oriented x3 Pale CVS: RRR RS: CTAB Abdomen : soft, NT   Investigations:  Recent Results (from the past 240 hour(s))  CLOSTRIDIUM DIFFICILE BY PCR     Status: Normal   Collection Time   10/10/11  2:20 AM      Component Value Range Status Comment   C difficile by pcr NEGATIVE  NEGATIVE  Final      Basic Metabolic Panel:  Basename 10/10/11 0344 10/09/11 1443  NA 137 131*  K 4.2 4.5  CL 109 97  CO2 20 22  GLUCOSE 103* 111*  BUN 57* 50*  CREATININE 0.82 0.83  CALCIUM 7.5* 9.1  MG -- --  PHOS -- --   Liver Function Tests:  Basename 10/09/11 1443  AST 14  ALT 9  ALKPHOS 57  BILITOT 0.2*  PROT 6.6  ALBUMIN 3.1*     CBC:  Basename 10/10/11 1252 10/10/11 0344  WBC 14.6* 15.9*  NEUTROABS -- --  HGB 7.6* 6.2*  HCT 22.3* 18.7*  MCV 88.5 92.1  PLT 316 360    No results found.    Medications:  Scheduled:    . sodium chloride  500 mL Intravenous Once  . acetaminophen  650 mg Oral Once  . allopurinol  100 mg Oral QHS  . antiseptic oral rinse  15 mL Mouth Rinse BID  . atorvastatin  10 mg Oral q1800  . diphenhydrAMINE  25 mg Oral Once  . escitalopram  20 mg Oral q morning - 10a  . metoprolol  2.5 mg Intravenous Once  . metoprolol  50 mg Oral BID  . morphine      . nitroGLYCERIN      . pantoprazole (PROTONIX) IV  40 mg Intravenous Once  . sodium chloride  1,000 mL Intravenous Once  . sodium chloride  3 mL Intravenous Q12H  . timolol  1 drop Both Eyes Daily  . Travoprost (BAK Free)  1 drop Both Eyes QHS  . DISCONTD: diltiazem  120 mg Oral Q1200  . DISCONTD: isosorbide mononitrate  60 mg Oral Q1200  . DISCONTD: metoprolol  75 mg Oral Q1200  . DISCONTD: simvastatin  20 mg Oral QPM  . DISCONTD: travoprost  (benzalkonium)  1 drop Both Eyes QHS   Continuous:    . sodium chloride 100 mL/hr at 10/10/11 0626  . pantoprozole (PROTONIX) infusion 8 mg/hr (10/10/11 0626)   GNF:AOZHYQMVHQION, acetaminophen, ALPRAZolam, alum & mag hydroxide-simeth, cyclobenzaprine, morphine injection, nitroGLYCERIN, ondansetron (ZOFRAN) IV, ondansetron, DISCONTD: butamben-tetracaine-benzocaine, DISCONTD: fentaNYL, DISCONTD: midazolam   EGD 10/10/11 1. Blood in the body and the antrum of the stomach  2) Stricture in the distal esophagus  the patient could be bleeding from a gastric AVM had has been  obscured by the old blood in the stomach.  RECOMMENDATIONS:  1) Sips clears today otherwise NPO; will readdress tomorrow.  follow clinically and if bleeding continues may need to repeat  EGD after the stomach has been adequately clear.  ______________________________  Carman Ching    Impression:  Principal Problem:  *GI bleed Active Problems:  Hyponatremia  Diarrhea  Vomiting  Weakness  Leukocytosis  Thrombocythemia  GERD (gastroesophageal reflux disease)  CAD (coronary artery disease)  Anemia due to blood loss, acute     Plan: Transfuse  1 more unit prbcs Continue to monitor closely on telemetry  Continue statin and BB for CAD Clear diet per GI     LOS: 1 day   Chevelle Coulson, MD Pager: [phone] 10/10/2011, 2:09 PM

## 2011-10-10 NOTE — Progress Notes (Signed)
CRITICAL VALUE ALERT  Critical value received: Hgb 6.2  Date of notification: 10/10/11  Time of notification:  0420  Critical value read back:yes  Nurse who received alert:  Nelda Marseille, RN  MD notified (1st page):  Dr. Adela Glimpse  Time of first page:  0425  MD notified (2nd page):Dr. Adela Glimpse  Time of second WUJW:[id number]  Responding MD:  Dr. Adela Glimpse  Time MD responded:  [phone] Dr. Adela Glimpse stated that she was going to call patient's son Michele Mcalpine concerning blood transfusion. No new orders given. Will continue to monitor. Nelda Marseille, RN

## 2011-10-10 NOTE — Consult Note (Signed)
[facility] CONSULT Reason for consult GI bleeding Referring Physician: [facility]. PCP Elby Showers M.D.  Ellen Marshall is an 78 y.o. female.  HPI: Patient is a resident of [facility]. She has recently moved to this area from out of town. She has had diarrhea that has been worked up in the past by Dr. Laural Benes. Records from [facility] indicate that she had colonoscopy 2011 with small adenomatous polyp removed in 3 year repeat colonoscopy recommended. This the patient had had a history of previous colon polyps. She has been seen recently for evaluation of chronic diarrhea. Dr. Laural Benes felt that this could be a result of of Protonix and this was stopped. The cause of the diarrhea has not been adequately determined. She presented to the emergency room with melenic stools vomiting and regurgitation of food. She has had drop in hemoglobin from 8.6-6.2 with passage of melenic stool. EGD was discussed with her son who is her power of attorney and consent for EGD obtain.  Past Medical History  Diagnosis Date  . Coronary artery disease   . Hypertension   . GERD (gastroesophageal reflux disease)   . Shortness of breath     laying down    Past Surgical History  Procedure Date  . Colonscopy   . Esophagogastroduodenoscopy   . Stretching of egd   . Abdominal hysterectomy     History reviewed. No pertinent family history.  Social History:  reports that she has never smoked. She does not have any smokeless tobacco history on file. She reports that she does not drink alcohol or use illicit drugs.  Allergies: No Known Allergies  Medications;    . sodium chloride  500 mL Intravenous Once  . acetaminophen  650 mg Oral Once  . allopurinol  100 mg Oral QHS  . antiseptic oral rinse  15 mL Mouth Rinse BID  . atorvastatin  10 mg Oral q1800  . diphenhydrAMINE  25 mg Oral Once  . escitalopram  20 mg Oral q morning - 10a  . metoprolol  2.5 mg Intravenous Once  . metoprolol  50 mg  Oral BID  . morphine      . nitroGLYCERIN      . pantoprazole (PROTONIX) IV  40 mg Intravenous Once  . sodium chloride  1,000 mL Intravenous Once  . sodium chloride  3 mL Intravenous Q12H  . timolol  1 drop Both Eyes Daily  . Travoprost (BAK Free)  1 drop Both Eyes QHS  . DISCONTD: diltiazem  120 mg Oral Q1200  . DISCONTD: isosorbide mononitrate  60 mg Oral Q1200  . DISCONTD: metoprolol  75 mg Oral Q1200  . DISCONTD: simvastatin  20 mg Oral QPM  . DISCONTD: travoprost (benzalkonium)  1 drop Both Eyes QHS   PRN Meds acetaminophen, acetaminophen, ALPRAZolam, alum & mag hydroxide-simeth, cyclobenzaprine, morphine injection, nitroGLYCERIN, ondansetron (ZOFRAN) IV, ondansetron, DISCONTD: butamben-tetracaine-benzocaine, DISCONTD: fentaNYL, DISCONTD: midazolam Results for orders placed during the hospital encounter of 10/09/11 (from the past 48 hour(s))  CBC     Status: Abnormal   Collection Time   10/09/11  2:43 PM      Component Value Range Comment   WBC 16.5 (*) 4.0 - 10.5 (K/uL)    RBC 3.68 (*) 3.87 - 5.11 (MIL/uL)    Hemoglobin 11.1 (*) 12.0 - 15.0 (g/dL)    HCT 16.1 (*) 09.6 - 46.0 (%)    MCV 92.4  78.0 - 100.0 (fL)    MCH 30.2  26.0 - 34.0 (pg)  MCHC 32.6  30.0 - 36.0 (g/dL)    RDW 40.9  81.1 - 91.4 (%)    Platelets 542 (*) 150 - 400 (K/uL)   COMPREHENSIVE METABOLIC PANEL     Status: Abnormal   Collection Time   10/09/11  2:43 PM      Component Value Range Comment   Sodium 131 (*) 135 - 145 (mEq/L)    Potassium 4.5  3.5 - 5.1 (mEq/L)    Chloride 97  96 - 112 (mEq/L)    CO2 22  19 - 32 (mEq/L)    Glucose, Bld 111 (*) 70 - 99 (mg/dL)    BUN 50 (*) 6 - 23 (mg/dL)    Creatinine, Ser 7.82  0.50 - 1.10 (mg/dL)    Calcium 9.1  8.4 - 10.5 (mg/dL)    Total Protein 6.6  6.0 - 8.3 (g/dL)    Albumin 3.1 (*) 3.5 - 5.2 (g/dL)    AST 14  0 - 37 (U/L)    ALT 9  0 - 35 (U/L)    Alkaline Phosphatase 57  39 - 117 (U/L)    Total Bilirubin 0.2 (*) 0.3 - 1.2 (mg/dL)    GFR calc non Af Amer  63 (*) >90 (mL/min)    GFR calc Af Amer 74 (*) >90 (mL/min)   TYPE AND SCREEN     Status: Normal (Preliminary result)   Collection Time   10/09/11  2:49 PM      Component Value Range Comment   ABO/RH(D) A POS      Antibody Screen NEG      Sample Expiration 10/12/2011      Unit Number [id number]      Blood Component Type RED CELLS,LR      Unit division 00      Status of Unit ALLOCATED      Transfusion Status OK TO TRANSFUSE      Crossmatch Result Compatible      Unit Number [id number]      Blood Component Type RED CELLS,LR      Unit division 00      Status of Unit ALLOCATED      Transfusion Status OK TO TRANSFUSE      Crossmatch Result Compatible      Unit Number [id number]      Blood Component Type RED CELLS,LR      Unit division 00      Status of Unit ISSUED      Transfusion Status OK TO TRANSFUSE      Crossmatch Result Compatible     ABO/RH     Status: Normal   Collection Time   10/09/11  2:49 PM      Component Value Range Comment   ABO/RH(D) A POS     URINALYSIS, ROUTINE W REFLEX MICROSCOPIC     Status: Abnormal   Collection Time   10/09/11  5:06 PM      Component Value Range Comment   Color, Urine YELLOW  YELLOW     APPearance CLEAR  CLEAR     Specific Gravity, Urine 1.012  1.005 - 1.030     pH 6.0  5.0 - 8.0     Glucose, UA NEGATIVE  NEGATIVE (mg/dL)    Hgb urine dipstick MODERATE (*) NEGATIVE     Bilirubin Urine NEGATIVE  NEGATIVE     Ketones, ur NEGATIVE  NEGATIVE (mg/dL)    Protein, ur NEGATIVE  NEGATIVE (mg/dL)    Urobilinogen, UA 0.2  0.0 - 1.0 (  mg/dL)    Nitrite NEGATIVE  NEGATIVE     Leukocytes, UA SMALL (*) NEGATIVE    URINE MICROSCOPIC-ADD ON     Status: Abnormal   Collection Time   10/09/11  5:06 PM      Component Value Range Comment   Squamous Epithelial / LPF FEW (*) RARE     WBC, UA 3-6  <3 (WBC/hpf)    RBC / HPF 0-2  <3 (RBC/hpf)    Bacteria, UA RARE  RARE    CBC     Status: Abnormal   Collection Time   10/09/11  8:05 PM      Component Value Range  Comment   WBC 16.5 (*) 4.0 - 10.5 (K/uL)    RBC 2.88 (*) 3.87 - 5.11 (MIL/uL)    Hemoglobin 8.6 (*) 12.0 - 15.0 (g/dL)    HCT 16.1 (*) 09.6 - 46.0 (%)    MCV 91.3  78.0 - 100.0 (fL)    MCH 29.9  26.0 - 34.0 (pg)    MCHC 32.7  30.0 - 36.0 (g/dL)    RDW 04.5  40.9 - 81.1 (%)    Platelets 474 (*) 150 - 400 (K/uL)   TSH     Status: Normal   Collection Time   10/09/11  8:05 PM      Component Value Range Comment   TSH 0.663  0.350 - 4.500 (uIU/mL)   CARDIAC PANEL(CRET KIN+CKTOT+MB+TROPI)     Status: Normal   Collection Time   10/09/11  8:05 PM      Component Value Range Comment   Total CK 14  7 - 177 (U/L)    CK, MB 1.5  0.3 - 4.0 (ng/mL)    Troponin I <0.30  <0.30 (ng/mL)    Relative Index RELATIVE INDEX IS INVALID  0.0 - 2.5    CLOSTRIDIUM DIFFICILE BY PCR     Status: Normal   Collection Time   10/10/11  2:20 AM      Component Value Range Comment   C difficile by pcr NEGATIVE  NEGATIVE    OCCULT BLOOD X 1 CARD TO LAB, STOOL     Status: Normal   Collection Time   10/10/11  2:20 AM      Component Value Range Comment   Fecal Occult Bld POSITIVE     BASIC METABOLIC PANEL     Status: Abnormal   Collection Time   10/10/11  3:44 AM      Component Value Range Comment   Sodium 137  135 - 145 (mEq/L)    Potassium 4.2  3.5 - 5.1 (mEq/L)    Chloride 109  96 - 112 (mEq/L)    CO2 20  19 - 32 (mEq/L)    Glucose, Bld 103 (*) 70 - 99 (mg/dL)    BUN 57 (*) 6 - 23 (mg/dL)    Creatinine, Ser 9.14  0.50 - 1.10 (mg/dL)    Calcium 7.5 (*) 8.4 - 10.5 (mg/dL)    GFR calc non Af Amer 64 (*) >90 (mL/min)    GFR calc Af Amer 75 (*) >90 (mL/min)   CBC     Status: Abnormal   Collection Time   10/10/11  3:44 AM      Component Value Range Comment   WBC 15.9 (*) 4.0 - 10.5 (K/uL)    RBC 2.03 (*) 3.87 - 5.11 (MIL/uL)    Hemoglobin 6.2 (*) 12.0 - 15.0 (g/dL)    HCT 78.2 (*) 95.6 - 46.0 (%)  MCV 92.1  78.0 - 100.0 (fL)    MCH 30.5  26.0 - 34.0 (pg)    MCHC 33.2  30.0 - 36.0 (g/dL)    RDW 32.4  40.1 -  02.7 (%)    Platelets 360  150 - 400 (K/uL) DELTA CHECK NOTED  CARDIAC PANEL(CRET KIN+CKTOT+MB+TROPI)     Status: Normal   Collection Time   10/10/11  3:50 AM      Component Value Range Comment   Total CK 17  7 - 177 (U/L)    CK, MB 1.6  0.3 - 4.0 (ng/mL)    Troponin I <0.30  <0.30 (ng/mL)    Relative Index RELATIVE INDEX IS INVALID  0.0 - 2.5    PREPARE RBC (CROSSMATCH)     Status: Normal   Collection Time   10/10/11  4:51 AM      Component Value Range Comment   Order Confirmation ORDER PROCESSED BY BLOOD BANK       No results found. ROS: GI: History of previous esophageal stricture dilated with no recent symptoms of dysphagia. History of colon polyps and chronic diarrhea as noted above General: Generalized weakness HEENT: Negative Pulmonary: No history of chronic lung disease Cardiovascular: No history of significant cardiac disease. Patient denies prior heart attack. Other:           Blood pressure 152/76, pulse 80, temperature 98.1 F (36.7 C), temperature source Oral, resp. rate 16, height 5\' 3"  (1.6 m), weight 60.782 kg (134 lb), SpO2 100.00%.  Physical exam:  General-this pleasant alert white female who is oriented and answers questions appropriately Eyes-sclera nonicteric Lungs-clear Heart-regular rate and rhythm without murmurs or gallops Abdomen-nontender, nondistended, good bowel sounds.  Assessment: 1. Melena with drop in hemoglobin. This could be ulcer disease Mallory-Weiss tear etc. She clearly needs an upper endoscopy. Have discussed this in detail with her son. 2. Chronic Diarrhea. C. difficile toxin negative here in the hospital. This could be microscopic colitis. She will likely need sigmoidoscopy and biopsy at some point in the future. 3. History of Colon Polyps colonoscopy up-to-date  Plan: 1. Will proceed with EGD and control of bleeding at this time have discussed this in detail with the patient's son.   Kamauri Kathol JR,Arvie Villarruel L 10/10/2011, 11:05 AM

## 2011-10-11 ENCOUNTER — Inpatient Hospital Stay (HOSPITAL_COMMUNITY): Payer: Medicare Other

## 2011-10-11 ENCOUNTER — Other Ambulatory Visit: Payer: Self-pay

## 2011-10-11 ENCOUNTER — Encounter (HOSPITAL_COMMUNITY): Payer: Self-pay | Admitting: Gastroenterology

## 2011-10-11 DIAGNOSIS — R197 Diarrhea, unspecified: Secondary | ICD-10-CM | POA: Diagnosis not present

## 2011-10-11 DIAGNOSIS — K921 Melena: Secondary | ICD-10-CM | POA: Diagnosis not present

## 2011-10-11 DIAGNOSIS — R112 Nausea with vomiting, unspecified: Secondary | ICD-10-CM | POA: Diagnosis not present

## 2011-10-11 DIAGNOSIS — E871 Hypo-osmolality and hyponatremia: Secondary | ICD-10-CM | POA: Diagnosis not present

## 2011-10-11 LAB — CBC
HCT: 25.5 % — ABNORMAL LOW (ref 36.0–46.0)
Hemoglobin: 8.8 g/dL — ABNORMAL LOW (ref 12.0–15.0)
MCV: 90.2 fL (ref 78.0–100.0)
Platelets: 294 10*3/uL (ref 150–400)
RBC: 2.95 MIL/uL — ABNORMAL LOW (ref 3.87–5.11)
RBC: 2.86 MIL/uL — ABNORMAL LOW (ref 3.87–5.11)
WBC: 12.6 10*3/uL — ABNORMAL HIGH (ref 4.0–10.5)
WBC: 15.2 10*3/uL — ABNORMAL HIGH (ref 4.0–10.5)

## 2011-10-11 LAB — CARDIAC PANEL(CRET KIN+CKTOT+MB+TROPI)
CK, MB: 2.8 ng/mL (ref 0.3–4.0)
Relative Index: INVALID (ref 0.0–2.5)
Troponin I: <0.3 ng/mL (ref <0.30)

## 2011-10-11 LAB — BASIC METABOLIC PANEL
CO2: 20 mEq/L (ref 19–32)
Calcium: 8 mg/dL — ABNORMAL LOW (ref 8.4–10.5)
GFR calc Af Amer: 89 mL/min — ABNORMAL LOW (ref >90)
GFR calc non Af Amer: 77 mL/min — ABNORMAL LOW (ref >90)
Sodium: 142 mEq/L (ref 135–145)

## 2011-10-11 MED ORDER — WHITE PETROLATUM GEL
Status: AC
  Filled 2011-10-11: qty 5

## 2011-10-11 MED ORDER — MUPIROCIN 2 % EX OINT
TOPICAL_OINTMENT | Freq: Two times a day (BID) | CUTANEOUS | Status: DC
  Filled 2011-10-11: qty 22

## 2011-10-11 MED ORDER — PANTOPRAZOLE SODIUM 40 MG PO TBEC
40.0000 mg | DELAYED_RELEASE_TABLET | Freq: Two times a day (BID) | ORAL | Status: DC
  Administered 2011-10-12 – 2011-10-13 (×2): 40 mg via ORAL
  Filled 2011-10-11: qty 1
  Filled 2011-10-11: qty 2

## 2011-10-11 MED ORDER — POTASSIUM CHLORIDE CRYS ER 20 MEQ PO TBCR
40.0000 meq | EXTENDED_RELEASE_TABLET | Freq: Once | ORAL | Status: AC
  Administered 2011-10-11: 40 meq via ORAL
  Filled 2011-10-11: qty 2

## 2011-10-11 NOTE — Progress Notes (Signed)
Physical Therapy Treatment Patient Details Name: Ellen Marshall MRN: [id number] DOB: 07-29-1925 Today's Date: 10/11/2011  PT Assessment/Plan  PT - Assessment/Plan Comments on Treatment Session: Pt weaker today than on eval yesterday.  Will monitor progress closely because pt will need to be independent to return to [facility]. PT Plan: Other (comment) (Will monitor for possible need for alternative plan to [facility]) Follow Up Recommendations: Other (comment) (Need to follow next few days to see progress) PT Goals  Acute Rehab PT Goals PT Goal: Sit to Stand - Progress: Not progressing PT Goal: Stand to Sit - Progress: Not progressing PT Goal: Ambulate - Progress: Not progressing  PT Treatment Precautions/Restrictions  Precautions Precautions: Fall Restrictions Weight Bearing Restrictions: No Mobility (including Balance) Bed Mobility Supine to Sit: 4: Min assist;HOB elevated (Comment degrees) (HOB 30 degrees) Supine to Sit Details (indicate cue type and reason): Assist to bring trunk up Sitting - Scoot to Edge of Bed: 4: Min assist Transfers Sit to Stand: 4: Min assist;With upper extremity assist;From bed Sit to Stand Details (indicate cue type and reason): Assist to bring hips up Stand to Sit: 4: Min assist;With upper extremity assist;With armrests;To chair/3-in-1 Stand to Sit Details: cues for hand placement.  Assist to control descent Stand Pivot Transfers: 4: Min Insurance claims handler Standing - Balance Support: Left upper extremity supported Static Standing - Level of Assistance: 4: Min assist Exercise    End of Session PT - End of Session Equipment Utilized During Treatment: Gait belt Activity Tolerance: Patient limited by fatigue Patient left: in chair;with call bell in reach Nurse Communication: Mobility status for transfers General Behavior During Session: [facility] for tasks performed Cognition: [facility] for tasks performed  [facility] 10/11/2011,  2:17 PM  [employer] PT [phone]

## 2011-10-11 NOTE — Progress Notes (Signed)
[facility] PROGRESS NOTE Subjective Patient remains on clear liquids and had dark bowel movement overnight but none today. She is urinating well. Hemoglobin 9.0 this morning was 9.7 yesterday. She remains on Protonix drip.  Objective: Vital signs in last 24 hours: Temp:  [97.5 F (36.4 C)-98.6 F (37 C)] 97.5 F (36.4 C) (03/27 1356) Pulse Rate:  [72-94] 72  (03/27 1557) Resp:  [19-20] 20  (03/27 1356) BP: (134-155)/(68-82) 149/71 mmHg (03/27 1557) SpO2:  [99 %-100 %] 99 % (03/27 1356) Last BM Date: 10/11/11  Intake/Output from previous day: 03/26 0701 - 03/27 0700 In: 2097.2 [I.V.:1611.8; Blood:485.4] Out: 900 [Urine:900] Intake/Output this shift: Total I/O In: 120 [P.O.:120] Out: 550 [Urine:550]  PE: Gen.-alert and oriented no distress Abdomen-soft nontender nondistended with good bowel sounds   Lab Results:  Basename 10/11/11 0600 10/10/11 1955 10/10/11 1252 10/10/11 0344 10/09/11 2005  WBC 12.6* 17.4* 14.6* 15.9* 16.5*  HGB 9.0* 9.7* 7.6* 6.2* 8.6*  HCT 26.6* 28.2* 22.3* 18.7* 26.3*  PLT 294 294 316 360 474*   BMET  Basename 10/11/11 0524 10/10/11 0344 10/09/11 1443  NA 142 137 131*  K 3.4* 4.2 4.5  CL 115* 109 97  CO2 20 20 22   CREATININE 0.74 0.82 0.83   LFT  Basename 10/09/11 1443  PROT 6.6  AST 14  ALT 9  ALKPHOS 57  BILITOT 0.2*  BILIDIR --  IBILI --   PT/INR No results found for this basename: LABPROT:3,INR:3 in the last 72 hours PANCREAS No results found for this basename: LIPASE:3 in the last 72 hours       Studies/Results: No results found.  Medications: I have reviewed the patient's current medications.  Assessment/Plan: 1. Upper GI bleed. Patient had a coffee ground material in her stomach without a bleeding site found. There was no ulceration, gastritis, or esophagitis. I suspect that she has AVM that was covered up with clots and coffee ground material. I think we can go ahead and switch her over to oral Protonix.  If she drops her hemoglobin further, we should repeat EGD while her stomach is clean.   Zamariya Neal JR,Julias Mould L 10/11/2011, 5:41 PM

## 2011-10-11 NOTE — Progress Notes (Signed)
RN called NP 2/2 pt c/o chest pain with HA.  MSO4 given with quick relief. NP to floor. EKG without acute changes compared with previous. CP was fleeting. No change in her VS. No desaturations. Last 3 CEs were neg, but will run one more set. Since, not still c/o CP and BP is stable, will hold off on NTG. Will follow. If trop up, will reassess. CXR pending. See orders for other plans. Believe this to be atypical CP. Son informed at bedside. Maren Reamer, NP [facility]

## 2011-10-11 NOTE — Progress Notes (Signed)
PATIENT DETAILS Name: Viviane Semidey Age: 78 y.o. Sex: female Date of Birth: Oct 15, 1925 Admit Date: 10/09/2011 WJX:BJYNW,GNFAOZHYQ, MD, MD  Subjective: 2 dark colored BM's overnight-doesn't have major complaints  Objective: Vital signs in last 24 hours: Filed Vitals:   10/10/11 2140 10/11/11 0527 10/11/11 1013 10/11/11 1227  BP: 155/82 144/74 134/72 146/71  Pulse: 94 81 85 75  Temp: 98.6 F (37 C) 98.4 F (36.9 C)    TempSrc: Oral Oral    Resp: 19 20    Height:      Weight:      SpO2: 100% 100%  100%    Weight change: -0.018 kg (-0.6 oz)  Body mass index is 23.74 kg/(m^2).  Intake/Output from previous day:  Intake/Output Summary (Last 24 hours) at 10/11/11 1352 Last data filed at 10/11/11 1027  Gross per 24 hour  Intake   1968 ml  Output   1300 ml  Net    668 ml    PHYSICAL EXAM: Gen Exam: Awake and alert with clear speech.   Neck: Supple, No JVD.   Chest: B/L Clear.   CVS: S1 S2 Regular, no murmurs.  Abdomen: soft, BS +, non tender, non distended.  Extremities: no edema, lower extremities warm to touch. Neurologic: Non Focal.   Skin: No Rash.   Wounds: N/A.    CONSULTS:  GI  LAB RESULTS: CBC  Lab 10/11/11 0600 10/10/11 1955 10/10/11 1252 10/10/11 0344 10/09/11 2005  WBC 12.6* 17.4* 14.6* 15.9* 16.5*  HGB 9.0* 9.7* 7.6* 6.2* 8.6*  HCT 26.6* 28.2* 22.3* 18.7* 26.3*  PLT 294 294 316 360 474*  MCV 90.2 88.7 88.5 92.1 91.3  MCH 30.5 30.5 30.2 30.5 29.9  MCHC 33.8 34.4 34.1 33.2 32.7  RDW 15.4 15.0 15.3 14.6 14.6  LYMPHSABS -- -- -- -- --  MONOABS -- -- -- -- --  EOSABS -- -- -- -- --  BASOSABS -- -- -- -- --  BANDABS -- -- -- -- --    Chemistries   Lab 10/11/11 0524 10/10/11 0344 10/09/11 1443  NA 142 137 131*  K 3.4* 4.2 4.5  CL 115* 109 97  CO2 20 20 22   GLUCOSE 94 103* 111*  BUN 28* 57* 50*  CREATININE 0.74 0.82 0.83  CALCIUM 8.0* 7.5* 9.1  MG -- -- --    GFR Estimated Creatinine Clearance: 44.1 ml/min (by C-G formula based on Cr of  0.74).  Coagulation profile No results found for this basename: INR:5,PROTIME:5 in the last 168 hours  Cardiac Enzymes  Lab 10/10/11 1253 10/10/11 0350 10/09/11 2005  CKMB 6.1* 1.6 1.5  TROPONINI <0.30 <0.30 <0.30  MYOGLOBIN -- -- --    No components found with this basename: POCBNP:3 No results found for this basename: DDIMER:2 in the last 72 hours No results found for this basename: HGBA1C:2 in the last 72 hours No results found for this basename: CHOL:2,HDL:2,LDLCALC:2,TRIG:2,CHOLHDL:2,LDLDIRECT:2 in the last 72 hours  Basename 10/09/11 2005  TSH 0.663  T4TOTAL --  T3FREE --  THYROIDAB --    Basename 10/10/11 0344  VITAMINB12 449  FOLATE --  FERRITIN --  TIBC --  IRON --  RETICCTPCT --   No results found for this basename: LIPASE:2,AMYLASE:2 in the last 72 hours  Urine Studies No results found for this basename: UACOL:2,UAPR:2,USPG:2,UPH:2,UTP:2,UGL:2,UKET:2,UBIL:2,UHGB:2,UNIT:2,UROB:2,ULEU:2,UEPI:2,UWBC:2,URBC:2,UBAC:2,CAST:2,CRYS:2,UCOM:2,BILUA:2 in the last 72 hours  MICROBIOLOGY: Recent Results (from the past 240 hour(s))  CLOSTRIDIUM DIFFICILE BY PCR     Status: Normal   Collection Time   10/10/11  2:20  AM      Component Value Range Status Comment   C difficile by pcr NEGATIVE  NEGATIVE  Final     RADIOLOGY STUDIES/RESULTS: No results found.  MEDICATIONS: Scheduled Meds:   . allopurinol  100 mg Oral QHS  . antiseptic oral rinse  15 mL Mouth Rinse BID  . atorvastatin  10 mg Oral q1800  . escitalopram  20 mg Oral q morning - 10a  . metoprolol  25 mg Oral BID  . sodium chloride  3 mL Intravenous Q12H  . timolol  1 drop Both Eyes Daily  . Travoprost (BAK Free)  1 drop Both Eyes QHS  . DISCONTD: metoprolol  50 mg Oral BID  . DISCONTD: mupirocin ointment   Nasal BID   Continuous Infusions:   . sodium chloride 20 mL/hr at 10/11/11 0539  . pantoprozole (PROTONIX) infusion 8 mg/hr (10/11/11 0539)   PRN Meds:.acetaminophen, acetaminophen, ALPRAZolam,  alum & mag hydroxide-simeth, cyclobenzaprine, morphine injection, nitroGLYCERIN, ondansetron (ZOFRAN) IV, ondansetron  Antibiotics: Anti-infectives    None      Assessment/Plan: Patient Active Hospital Problem List:  GI bleed  -upper GI Bleed -EGD done yesterday-non diagnostic-old blood present in stomach prevented better visualization -H/H remains Stable -continue with PPI -will defer to GI-regarding timing of repeat EGD  Anemia due to blood loss, acute  -due to above -s/p 2 units of PRBC transfusion -monitor H/H closely and will transfuse as needed  Hyponatremia  -likely secondary to GI loss -resolved with hydration  Chronic Diarrhea -Going on over several months -stool C Diff PCR negative -will defer further work up to the outpatient setting  HTN -relatively good control -continue with lopressor-will add other agents as tolerated and as clinical improvement continues  Dyslipidemia -continue with Statins  Leukocytosis -resolving -no foci of infective apparent, continue to watch of antibiotics  Thrombocythemia -? Secondary to iron deficiency -monitor  CAD (coronary artery disease) -avoid antiplatelet agents given ongoing GI bleed -per son-has known CAD-advised medical management by primary cardiologist  Disposition: -remain inpatient  DVT Prophylaxis: SCD's  Code Status: DNR-confirmed with son at bedside  Maretta Bees MD. 10/11/2011, 1:52 PM

## 2011-10-12 ENCOUNTER — Encounter (HOSPITAL_COMMUNITY): Payer: Self-pay | Admitting: *Deleted

## 2011-10-12 ENCOUNTER — Encounter (HOSPITAL_COMMUNITY)
Admission: EM | Disposition: A | Discharge status: Skilled Nursing Facility | Payer: Self-pay | Source: Home / Self Care | Attending: Internal Medicine

## 2011-10-12 DIAGNOSIS — K922 Gastrointestinal hemorrhage, unspecified: Secondary | ICD-10-CM | POA: Diagnosis not present

## 2011-10-12 DIAGNOSIS — R112 Nausea with vomiting, unspecified: Secondary | ICD-10-CM | POA: Diagnosis not present

## 2011-10-12 DIAGNOSIS — R197 Diarrhea, unspecified: Secondary | ICD-10-CM | POA: Diagnosis not present

## 2011-10-12 DIAGNOSIS — E871 Hypo-osmolality and hyponatremia: Secondary | ICD-10-CM | POA: Diagnosis not present

## 2011-10-12 DIAGNOSIS — K921 Melena: Secondary | ICD-10-CM | POA: Diagnosis not present

## 2011-10-12 HISTORY — PX: FLEXIBLE SIGMOIDOSCOPY: SHX5431

## 2011-10-12 HISTORY — PX: ESOPHAGOGASTRODUODENOSCOPY: SHX5428

## 2011-10-12 LAB — PRO B NATRIURETIC PEPTIDE: Pro B Natriuretic peptide (BNP): 1974 pg/mL — ABNORMAL HIGH (ref 0–450)

## 2011-10-12 LAB — BASIC METABOLIC PANEL
CO2: 24 mEq/L (ref 19–32)
Chloride: 110 mEq/L (ref 96–112)
Glucose, Bld: 97 mg/dL (ref 70–99)
Potassium: 4.2 mEq/L (ref 3.5–5.1)
Sodium: 140 mEq/L (ref 135–145)

## 2011-10-12 LAB — CBC
Hemoglobin: 8.9 g/dL — ABNORMAL LOW (ref 12.0–15.0)
RBC: 2.9 MIL/uL — ABNORMAL LOW (ref 3.87–5.11)
WBC: 11 10*3/uL — ABNORMAL HIGH (ref 4.0–10.5)

## 2011-10-12 SURGERY — EGD (ESOPHAGOGASTRODUODENOSCOPY)
Anesthesia: Moderate Sedation

## 2011-10-12 MED ORDER — DIPHENHYDRAMINE HCL 50 MG/ML IJ SOLN
INTRAMUSCULAR | Status: DC | PRN
  Administered 2011-10-12 (×2): 12.5 mg via INTRAVENOUS

## 2011-10-12 MED ORDER — MENTHOL 3 MG MT LOZG
1.0000 | LOZENGE | OROMUCOSAL | Status: DC | PRN
  Filled 2011-10-12: qty 9

## 2011-10-12 MED ORDER — BUTAMBEN-TETRACAINE-BENZOCAINE 2-2-14 % EX AERO
INHALATION_SPRAY | CUTANEOUS | Status: DC | PRN
  Administered 2011-10-12: 2 via TOPICAL

## 2011-10-12 MED ORDER — FENTANYL CITRATE 0.05 MG/ML IJ SOLN
INTRAMUSCULAR | Status: AC
  Filled 2011-10-12: qty 2

## 2011-10-12 MED ORDER — DIPHENHYDRAMINE HCL 50 MG/ML IJ SOLN
INTRAMUSCULAR | Status: AC
  Filled 2011-10-12: qty 1

## 2011-10-12 MED ORDER — FUROSEMIDE 10 MG/ML IJ SOLN
20.0000 mg | Freq: Once | INTRAMUSCULAR | Status: AC
  Administered 2011-10-12: 20 mg via INTRAVENOUS
  Filled 2011-10-12: qty 2

## 2011-10-12 MED ORDER — FENTANYL CITRATE 0.05 MG/ML IJ SOLN
INTRAMUSCULAR | Status: DC | PRN
  Administered 2011-10-12 (×3): 25 ug via INTRAVENOUS
  Administered 2011-10-12: 12.5 ug via INTRAVENOUS

## 2011-10-12 NOTE — H&P (View-Only) (Signed)
[facility] PROGRESS NOTE Subjective Patient remains on clear liquids and had dark bowel movement overnight but none today. She is urinating well. Hemoglobin 9.0 this morning was 9.7 yesterday. She remains on Protonix drip.  Objective: Vital signs in last 24 hours: Temp:  [97.5 F (36.4 C)-98.6 F (37 C)] 97.5 F (36.4 C) (03/27 1356) Pulse Rate:  [72-94] 72  (03/27 1557) Resp:  [19-20] 20  (03/27 1356) BP: (134-155)/(68-82) 149/71 mmHg (03/27 1557) SpO2:  [99 %-100 %] 99 % (03/27 1356) Last BM Date: 10/11/11  Intake/Output from previous day: 03/26 0701 - 03/27 0700 In: 2097.2 [I.V.:1611.8; Blood:485.4] Out: 900 [Urine:900] Intake/Output this shift: Total I/O In: 120 [P.O.:120] Out: 550 [Urine:550]  PE: Gen.-alert and oriented no distress Abdomen-soft nontender nondistended with good bowel sounds   Lab Results:  Basename 10/11/11 0600 10/10/11 1955 10/10/11 1252 10/10/11 0344 10/09/11 2005  WBC 12.6* 17.4* 14.6* 15.9* 16.5*  HGB 9.0* 9.7* 7.6* 6.2* 8.6*  HCT 26.6* 28.2* 22.3* 18.7* 26.3*  PLT 294 294 316 360 474*   BMET  Basename 10/11/11 0524 10/10/11 0344 10/09/11 1443  NA 142 137 131*  K 3.4* 4.2 4.5  CL 115* 109 97  CO2 20 20 22  CREATININE 0.74 0.82 0.83   LFT  Basename 10/09/11 1443  PROT 6.6  AST 14  ALT 9  ALKPHOS 57  BILITOT 0.2*  BILIDIR --  IBILI --   PT/INR No results found for this basename: LABPROT:3,INR:3 in the last 72 hours PANCREAS No results found for this basename: LIPASE:3 in the last 72 hours       Studies/Results: No results found.  Medications: I have reviewed the patient's current medications.  Assessment/Plan: 1. Upper GI bleed. Patient had a coffee ground material in her stomach without a bleeding site found. There was no ulceration, gastritis, or esophagitis. I suspect that she has AVM that was covered up with clots and coffee ground material. I think we can go ahead and switch her over to oral Protonix.  If she drops her hemoglobin further, we should repeat EGD while her stomach is clean.   Marisol Glazer JR,Barbra Miner L 10/11/2011, 5:41 PM   

## 2011-10-12 NOTE — Interval H&P Note (Signed)
History and Physical Interval Note:  10/12/2011 1:14 PM  Ellen Marshall  has presented today for surgery, with the diagnosis of GI bleed, diarrhea  The various methods of treatment have been discussed with the patient and family. After consideration of risks, benefits and other options for treatment, the patient has consented to  Procedure(s) (LRB): ESOPHAGOGASTRODUODENOSCOPY (EGD) (N/A) FLEXIBLE SIGMOIDOSCOPY (N/A) as a surgical intervention .  The patients' history has been reviewed, patient examined, no change in status, stable for surgery.  I have reviewed the patients' chart and labs.  Questions were answered to the patient's satisfaction.     [name],Kenley Rettinger L

## 2011-10-12 NOTE — Op Note (Signed)
Ellen Marshall [facility] [address] [place], [place]  [address]  FLEXIBLE SIGMOIDOSCOPY PROCEDURE REPORT  PATIENT:  Ellen Marshall, Ellen Marshall  MR#:  [id number] BIRTHDATE:  Sep 29, 1925, 85 yrs. old  GENDER:  female  ENDOSCOPIST:  Carman Ching Referred by:  Elby Showers, M.D.  PROCEDURE DATE:  10/12/2011 PROCEDURE:  Flexible Sigmoidoscopy with biopsy ASA CLASS:  Class II INDICATIONS:  unexplained diarrhea  MEDICATIONS:   Benadryl 25 mg IV, Fentanyl 87.5 mcg IV  DESCRIPTION OF PROCEDURE:   After the risks benefits and alternatives of the procedure were thoroughly explained, informed consent was obtained.  Digital rectal exam was performed and revealed no abnormalities.   The EG-[id number] ([id number]) endoscope was introduced through the anus and advanced to the sigmoid colon, without limitations.  The quality of the prep was fair.  The instrument was then slowly withdrawn as the mucosa was fully examined. <<PROCEDUREIMAGES>>  endoscopically normal mucosa without edema, ulceration in the sigmoid colon. scope advanced 45 cm. No polyps diverticula or other lesions seen. Mucosa was completely normal and was biopsied for microscopic colitis. With standard forceps, biopsy was obtained and sent to pathology.   Retroflexion was not performed. The scope was then withdrawn from the patient and the procedure terminated.  COMPLICATIONS:  A complication of none occurred on 10/12/2011 at.  ENDOSCOPIC IMPRESSION: 1) Normal mucosa in the sigmoid colon RECOMMENDATIONS: 1) continue current meds 2) await biopsy results  REPEAT EXAM:  No  ______________________________ Carman Ching  CC:  Elby Showers, MD  n. Rosalie DoctorCarman Ching at 10/12/2011 02:09 PM  Markus Daft, [phone]

## 2011-10-12 NOTE — Op Note (Signed)
[facility] [address] [place], [place]  [address]  ENDOSCOPY PROCEDURE REPORT  PATIENT:  Ellen Marshall, Ellen Marshall  MR#:  [id number] BIRTHDATE:  March 28, 1926, 85 yrs. old  GENDER:  female  ENDOSCOPIST:  Carman Ching Referred by:  Elby Showers, M.D.  PROCEDURE DATE:  10/12/2011 PROCEDURE:  EGD with biopsy, [address] ASA CLASS:  Class II INDICATIONS:  upper G.I. bleeding patient had EGD 2 days ago without any active bleeding and a small amount of coffee-ground material in the stomach. This is done to reevaluate for a source of bleeding. The procedure have been explained to her son who is the POA and consent obtained  MEDICATIONS:   Benadryl 25, Fentanyl 87.5 mcg IV TOPICAL ANESTHETIC:  Cetacaine Spray  DESCRIPTION OF PROCEDURE:   After the risks and benefits of the procedure were explained, informed consent was obtained.  The Pentax Gastroscope [id number] endoscope was introduced through the mouth and advanced to the second portion of the duodenum.  The instrument was slowly withdrawn as the mucosa was fully examined. <<PROCEDUREIMAGES>>  the duodenal bulb and second duodenum were without abnormalities. With standard forceps, a biopsy was obtained and sent to pathology. biopsies for celiac disease were obtained  nl esophagus.  nl stomach. there was absolutely no signs of bleeding careful examination did not reveal any AV or Dulafoy's lesions Retroflexed views revealed no abnormalities.    The scope was then withdrawn from the patient and the procedure completed.  COMPLICATIONS:  A complication of none occurred on 10/12/2011 at.  ENDOSCOPIC IMPRESSION: 1) Nl duodenum 2) Nl esophagus 3) Nl stomach no signs of active bleeding. Biopsies obtained for celiac disease. RECOMMENDATIONS: 1) sigmoidoscopy  ______________________________ Carman Ching  CC:  Elby Showers, MD  n. Rosalie DoctorCarman Ching at 10/12/2011 02:04 PM  Markus Daft, [phone]

## 2011-10-12 NOTE — Progress Notes (Signed)
Clinical Social Work Department BRIEF PSYCHOSOCIAL ASSESSMENT 10/12/2011  Patient:  Ellen Marshall, Ellen Marshall     Account Number:  [id number]     Admit date:  10/09/2011  Clinical Social Worker:  Jacelyn Grip  Date/Time:  10/12/2011 05:00 PM  Referred by:  Physician  Date Referred:  10/12/2011  Other Referral:   Interview type:  Patient Other interview type:   Patient son and patient daughter-in-law    PSYCHOSOCIAL DATA Living Status:  ALONE Admitted from facility:  [facility] Level of care:  Independent Living Primary support name:  Phil Sevin/[phone]/son Primary support relationship to patient:  CHILD, ADULT Degree of support available:   strong, pt daughter-in-law at bedside and pt son reached by telephone    CURRENT CONCERNS Current Concerns  Post-Acute Placement   Other Concerns:    SOCIAL WORK ASSESSMENT / PLAN CSW received notification from MD that pt appears to be becoming weaker and will need short term rehab at SNF before returning to [facility]. CSW MSW intern, Arnette Norris met with pt at bedside and pt initially hesitant about SNF search and wanted to discuss with pt son and per MSW intern report pt appeared tired as she had been in a procedure the majority of the day. CSW notifed MD that pt not yet agreeable to initiation of SNF search. Per MD, MD spoke with pt and pt daughter-in-law at bedside and pt son by phone and pt agreeable to SNF search in [facility]. CSW spoke with pt son by telephone and discussed SNF process. CSW completed FL2 and initiated SNF search in [place]. CSW notified pt son that pt likely ready for d/c tomorrow and encouraged pt son to review list of SNF left at bedside. Pt son expressed understaind. CSW to follow up with pt and pt family in regard to bed offers and get decision for SNF. CSW to facilitate pt discharge needs when bed available.   Assessment/plan status:  Other - See comment Other assessment/  plan:   discharge planning   Information/referral to community resources:   [facility] list    PATIENT'S/FAMILY'S RESPONSE TO PLAN OF CARE: Pt alert and oriented, but quiet at bedside and appeared tired from procedures. Pt son and daughter-in-law supportive and actively involved in pt care. Pt initially hesitant about short term rehab at SNF, but pt and pt family agreeable and plan is for pt to discharge to short term rehab.     Jacklynn Lewis, MSW, LCSWA  Clinical Social Work [phone]

## 2011-10-12 NOTE — Progress Notes (Signed)
PATIENT DETAILS Name: Ellen Marshall Age: 78 y.o. Sex: female Date of Birth: 1925-10-15 Admit Date: 10/09/2011 ZOX:[id number], MD, MD  Subjective: Transient chest pain last night Developed mild hoarseness of voice  Objective: Vital signs in last 24 hours: Filed Vitals:   10/12/11 1340 10/12/11 1345 10/12/11 1350 10/12/11 1401  BP: 179/74 185/73 175/65 165/80  Pulse:      Temp:      TempSrc:      Resp: 20 27 36 20  Height:      Weight:      SpO2: 100% 100% 100% 100%    Weight change:   Body mass index is 23.74 kg/(m^2).  Intake/Output from previous day:  Intake/Output Summary (Last 24 hours) at 10/12/11 1415 Last data filed at 10/12/11 0325  Gross per 24 hour  Intake 1008.75 ml  Output    876 ml  Net 132.75 ml    PHYSICAL EXAM: Gen Exam: Awake and alert with clear speech.   Neck: Supple, No JVD.   Chest: B/L Clear.   CVS: S1 S2 Regular, no murmurs.  Abdomen: soft, BS +, non tender, non distended.  Extremities: no edema, lower extremities warm to touch. Neurologic: Non Focal.   Skin: No Rash.   Wounds: N/A.    CONSULTS:  GI  LAB RESULTS: CBC  Lab 10/12/11 0907 10/11/11 2050 10/11/11 0600 10/10/11 1955 10/10/11 1252  WBC 11.0* 15.2* 12.6* 17.4* 14.6*  HGB 8.9* 8.8* 9.0* 9.7* 7.6*  HCT 26.7* 25.5* 26.6* 28.2* 22.3*  PLT 306 298 294 294 316  MCV 92.1 89.2 90.2 88.7 88.5  MCH 30.7 30.8 30.5 30.5 30.2  MCHC 33.3 34.5 33.8 34.4 34.1  RDW 15.2 15.0 15.4 15.0 15.3  LYMPHSABS -- -- -- -- --  MONOABS -- -- -- -- --  EOSABS -- -- -- -- --  BASOSABS -- -- -- -- --  BANDABS -- -- -- -- --    Chemistries   Lab 10/12/11 0907 10/11/11 0524 10/10/11 0344 10/09/11 1443  NA 140 142 137 131*  K 4.2 3.4* 4.2 4.5  CL 110 115* 109 97  CO2 24 20 20 22   GLUCOSE 97 94 103* 111*  BUN 13 28* 57* 50*  CREATININE 0.76 0.74 0.82 0.83  CALCIUM 8.6 8.0* 7.5* 9.1  MG -- -- -- --    GFR Estimated Creatinine Clearance: 44.1 ml/min (by C-G formula based on Cr of  0.76).  Coagulation profile No results found for this basename: INR:5,PROTIME:5 in the last 168 hours  Cardiac Enzymes  Lab 10/11/11 2126 10/10/11 1253 10/10/11 0350  CKMB 2.8 6.1* 1.6  TROPONINI <0.30 <0.30 <0.30  MYOGLOBIN -- -- --    No components found with this basename: POCBNP:3 No results found for this basename: DDIMER:2 in the last 72 hours No results found for this basename: HGBA1C:2 in the last 72 hours No results found for this basename: CHOL:2,HDL:2,LDLCALC:2,TRIG:2,CHOLHDL:2,LDLDIRECT:2 in the last 72 hours  Basename 10/09/11 2005  TSH 0.663  T4TOTAL --  T3FREE --  THYROIDAB --    Basename 10/10/11 0344  VITAMINB12 449  FOLATE --  FERRITIN --  TIBC --  IRON --  RETICCTPCT --   No results found for this basename: LIPASE:2,AMYLASE:2 in the last 72 hours  Urine Studies No results found for this basename: UACOL:2,UAPR:2,USPG:2,UPH:2,UTP:2,UGL:2,UKET:2,UBIL:2,UHGB:2,UNIT:2,UROB:2,ULEU:2,UEPI:2,UWBC:2,URBC:2,UBAC:2,CAST:2,CRYS:2,UCOM:2,BILUA:2 in the last 72 hours  MICROBIOLOGY: Recent Results (from the past 240 hour(s))  CLOSTRIDIUM DIFFICILE BY PCR     Status: Normal   Collection Time   10/10/11  2:20  AM      Component Value Range Status Comment   C difficile by pcr NEGATIVE  NEGATIVE  Final     RADIOLOGY STUDIES/RESULTS: No results found.  MEDICATIONS: Scheduled Meds:    . allopurinol  100 mg Oral QHS  . antiseptic oral rinse  15 mL Mouth Rinse BID  . atorvastatin  10 mg Oral q1800  . escitalopram  20 mg Oral q morning - 10a  . metoprolol  25 mg Oral BID  . pantoprazole  40 mg Oral BID AC  . potassium chloride  40 mEq Oral Once  . sodium chloride  3 mL Intravenous Q12H  . timolol  1 drop Both Eyes Daily  . Travoprost (BAK Free)  1 drop Both Eyes QHS  . white petrolatum       Continuous Infusions:    . sodium chloride 20 mL/hr at 10/12/11 1251  . DISCONTD: pantoprozole (PROTONIX) infusion 8 mg/hr (10/11/11 0539)   PRN  Meds:.acetaminophen, acetaminophen, ALPRAZolam, alum & mag hydroxide-simeth, cyclobenzaprine, menthol-cetylpyridinium, morphine injection, nitroGLYCERIN, ondansetron (ZOFRAN) IV, ondansetron, DISCONTD: butamben-tetracaine-benzocaine, DISCONTD: diphenhydrAMINE, DISCONTD: fentaNYL  Antibiotics: Anti-infectives    None      Assessment/Plan: Patient Active Hospital Problem List:  GI bleed  -upper GI Bleed-seems to have resolved -EGD done yesterday-non diagnostic-old blood present in stomach prevented better visualization -H/H remains Stable -continue with PPI-now changed to oral -repeat EGD today-no evidence of any bleeding source  Anemia due to blood loss, acute  -due to above -s/p 2 units of PRBC transfusion -monitor H/H closely and will transfuse as needed  Hyponatremia  -likely secondary to GI loss -resolved with hydration  Chronic Diarrhea -Going on over several months -stool C Diff PCR negative -will defer further work up to the outpatient setting  HTN -relatively good control -continue with lopressor-will add other agents as tolerated and as clinical improvement continues  Dyslipidemia -continue with Statins  Leukocytosis -resolving -no foci of infective apparent, continue to watch of antibiotics  Thrombocythemia -? Secondary to iron deficiency -monitor  CAD (coronary artery disease) -avoid antiplatelet agents given ongoing GI bleed -per son-has known CAD-advised medical management by primary cardiologist  Disposition: -remain inpatient-possible D/C back to SNF in 24 hours  DVT Prophylaxis: SCD's  Code Status: DNR-confirmed with son at bedside  Maretta Bees MD. 10/12/2011, 2:15 PM

## 2011-10-12 NOTE — Progress Notes (Signed)
Clinical Social Work Department CLINICAL SOCIAL WORK PLACEMENT NOTE 10/12/2011  Patient:  Ellen Marshall, Ellen Marshall  Account Number:  [id number] Admit date:  10/09/2011  Clinical Social Worker:  Jacelyn Grip  Date/time:  10/12/2011 05:35 PM  Clinical Social Work is seeking post-discharge placement for this patient at the following level of care:      (*CSW will update this form in Epic as items are completed)   10/12/2011  Patient/family provided with [facility] Department of Clinical Social Work's list of facilities offering this level of care within the geographic area requested by the patient (or if unable, by the patient's family).  10/12/2011  Patient/family informed of their freedom to choose among providers that offer the needed level of care, that participate in Medicare, Medicaid or managed care program needed by the patient, have an available bed and are willing to accept the patient.  10/12/2011  Patient/family informed of [facility]' ownership interest in [facility], as well as of the fact that they are under no obligation to receive care at this facility.  PASARR submitted to EDS on 10/12/2011 PASARR number received from EDS on   FL2 transmitted to all facilities in geographic area requested by pt/family on  10/12/2011 FL2 transmitted to all facilities within larger geographic area on   Patient informed that his/her managed care company has contracts with or will negotiate with  certain facilities, including the following:     Patient/family informed of bed offers received:   Patient chooses bed at  Physician recommends and patient chooses bed at    Patient to be transferred to  on   Patient to be transferred to facility by   The following physician request were entered in Epic:   Additional Comments:    Jacklynn Lewis, MSW, LCSWA  Clinical Social Work [phone]

## 2011-10-13 DIAGNOSIS — R262 Difficulty in walking, not elsewhere classified: Secondary | ICD-10-CM | POA: Diagnosis not present

## 2011-10-13 DIAGNOSIS — R112 Nausea with vomiting, unspecified: Secondary | ICD-10-CM | POA: Diagnosis not present

## 2011-10-13 DIAGNOSIS — D649 Anemia, unspecified: Secondary | ICD-10-CM | POA: Diagnosis not present

## 2011-10-13 DIAGNOSIS — E871 Hypo-osmolality and hyponatremia: Secondary | ICD-10-CM | POA: Diagnosis not present

## 2011-10-13 DIAGNOSIS — I2510 Atherosclerotic heart disease of native coronary artery without angina pectoris: Secondary | ICD-10-CM | POA: Diagnosis not present

## 2011-10-13 DIAGNOSIS — M6281 Muscle weakness (generalized): Secondary | ICD-10-CM | POA: Diagnosis not present

## 2011-10-13 DIAGNOSIS — Z9181 History of falling: Secondary | ICD-10-CM | POA: Diagnosis not present

## 2011-10-13 DIAGNOSIS — Z5189 Encounter for other specified aftercare: Secondary | ICD-10-CM | POA: Diagnosis not present

## 2011-10-13 DIAGNOSIS — D62 Acute posthemorrhagic anemia: Secondary | ICD-10-CM | POA: Diagnosis not present

## 2011-10-13 DIAGNOSIS — K921 Melena: Secondary | ICD-10-CM | POA: Diagnosis not present

## 2011-10-13 DIAGNOSIS — M109 Gout, unspecified: Secondary | ICD-10-CM | POA: Diagnosis not present

## 2011-10-13 DIAGNOSIS — I10 Essential (primary) hypertension: Secondary | ICD-10-CM | POA: Diagnosis not present

## 2011-10-13 DIAGNOSIS — R197 Diarrhea, unspecified: Secondary | ICD-10-CM | POA: Diagnosis not present

## 2011-10-13 DIAGNOSIS — D473 Essential (hemorrhagic) thrombocythemia: Secondary | ICD-10-CM | POA: Diagnosis not present

## 2011-10-13 DIAGNOSIS — E785 Hyperlipidemia, unspecified: Secondary | ICD-10-CM | POA: Diagnosis not present

## 2011-10-13 DIAGNOSIS — K922 Gastrointestinal hemorrhage, unspecified: Secondary | ICD-10-CM | POA: Diagnosis not present

## 2011-10-13 LAB — TYPE AND SCREEN
ABO/RH(D): A POS
Antibody Screen: NEGATIVE
Unit division: 0
Unit division: 0
Unit division: 0

## 2011-10-13 LAB — HEMOGLOBIN AND HEMATOCRIT, BLOOD
HCT: 26.1 % — ABNORMAL LOW (ref 36.0–46.0)
Hemoglobin: 8.9 g/dL — ABNORMAL LOW (ref 12.0–15.0)

## 2011-10-13 MED ORDER — MENTHOL 3 MG MT LOZG: 1.0000 | LOZENGE | OROMUCOSAL | Status: AC | PRN

## 2011-10-13 MED ORDER — PANTOPRAZOLE SODIUM 40 MG PO TBEC: 40.0000 mg | DELAYED_RELEASE_TABLET | Freq: Two times a day (BID) | ORAL | Status: DC

## 2011-10-13 MED ORDER — METOPROLOL TARTRATE 50 MG PO TABS: 50.0000 mg | ORAL_TABLET | Freq: Every day | ORAL | Status: DC

## 2011-10-13 NOTE — Progress Notes (Signed)
Physical Therapy Treatment Patient Details Name: Ellen Marshall MRN: [id number] DOB: 1925/12/30 Today's Date: 10/13/2011  PT Assessment/Plan  PT - Assessment/Plan Comments on Treatment Session: Pt progressing well with mobility. Pt is very motivated to ambulate and get back to prior level of function PT Plan: Discharge plan remains appropriate PT Frequency: Min 3X/week Follow Up Recommendations: Other (comment) Equipment Recommended: None recommended by PT PT Goals  Acute Rehab PT Goals PT Goal: Sit to Stand - Progress: Met PT Goal: Stand to Sit - Progress: Progressing toward goal PT Goal: Ambulate - Progress: Progressing toward goal  PT Treatment Precautions/Restrictions  Precautions Precautions: Fall Restrictions Weight Bearing Restrictions: No Mobility (including Balance) Bed Mobility Supine to Sit: 6: Modified independent (Device/Increase time) Sitting - Scoot to Edge of Bed: 6: Modified independent (Device/Increase time) Transfers Sit to Stand: 4: Min assist;From bed;From chair/3-in-1;With upper extremity assist (MinGuard A) Sit to Stand Details (indicate cue type and reason): Cues for safe hand placement. Practiced 3 times.  Stand to Sit: 5: Supervision;With upper extremity assist;With armrests;To chair/3-in-1 Ambulation/Gait Ambulation/Gait Assistance: 4: Min assist (MinGuard A) Ambulation/Gait Assistance Details (indicate cue type and reason): Pt required 3 sitting breaks with ambulation. Cues for upright posture Ambulation Distance (Feet): 160 Feet Assistive device: Rolling walker Gait Pattern: Shuffle;Trunk flexed Gait velocity: decreased    Exercise    End of Session PT - End of Session Equipment Utilized During Treatment: Gait belt Activity Tolerance: Patient tolerated treatment well Patient left: in chair;with call bell in reach;with family/visitor present Nurse Communication: Mobility status for transfers;Mobility status for ambulation General Behavior  During Session: [facility] for tasks performed Cognition: [facility] for tasks performed  Fredrich Birks 10/13/2011, 11:48 AM 10/13/2011 Fredrich Birks PTA [phone] pager [phone] office

## 2011-10-13 NOTE — Progress Notes (Signed)
CSW faxed dc summary to SNF. SNF agreeable to admission. CSW informed patient, son and RN of dc and all were agreeable. CSW prepared dc packet. CSW coordinated transportation via [facility] and informed them patient will be ready at 1345. CSW is signing off. [place], [place] [phone] Coverage for Jacklynn Lewis

## 2011-10-13 NOTE — Discharge Summary (Signed)
PATIENT DETAILS Name: Ellen Marshall Age: 78 y.o. Sex: female Date of Birth: [birth year] MRN: [id number]. Admit Date: 10/09/2011 Admitting Physician: Joseph Art, DO [id number],[id number], MD, MD  PRIMARY DISCHARGE DIAGNOSIS:  Principal Problem:  *GI bleed Active Problems:  Hyponatremia  Diarrhea  Vomiting  Weakness  Leukocytosis  Thrombocythemia  GERD (gastroesophageal reflux disease)  CAD (coronary artery disease)  Anemia due to blood loss, acute      PAST MEDICAL HISTORY: Past Medical History  Diagnosis Date  . Coronary artery disease   . Hypertension   . GERD (gastroesophageal reflux disease)   . Shortness of breath     laying down    DISCHARGE MEDICATIONS: Medication List  As of 10/13/2011 10:21 AM   STOP taking these medications         aspirin 325 MG tablet      DILT-CD 120 MG 24 hr capsule      isosorbide mononitrate 60 MG 24 hr tablet         TAKE these medications         allopurinol 100 MG tablet   Commonly known as: ZYLOPRIM   Take 100 mg by mouth at bedtime.      ALPRAZolam 0.25 MG tablet   Commonly known as: XANAX   Take 0.25 mg by mouth 3 (three) times daily as needed. As needed for anxiety.      Calcium + D 600-200 MG-UNIT Tabs   Generic drug: Calcium Carbonate-Vitamin D   Take 1 tablet by mouth 2 (two) times daily.      CENTRUM SILVER PO   Take 1 tablet by mouth daily at 12 noon.      cyclobenzaprine 10 MG tablet   Commonly known as: FLEXERIL   Take 10 mg by mouth 3 (three) times daily as needed. As needed for anxiety.      escitalopram 20 MG tablet   Commonly known as: LEXAPRO   Take 20 mg by mouth every morning.      Lutein 20 MG Tabs   Take 1 tablet by mouth every morning.      menthol-cetylpyridinium 3 MG lozenge   Commonly known as: CEPACOL   Take 1 lozenge (3 mg total) by mouth as needed.      metoprolol 50 MG tablet   Commonly known as: LOPRESSOR   Take 1 tablet (50 mg total) by mouth daily at 12 noon.     nitroGLYCERIN 0.4 MG SL tablet   Commonly known as: NITROSTAT   Place 0.4 mg under the tongue every 5 (five) minutes as needed. As needed for chest pain.      pantoprazole 40 MG tablet   Commonly known as: PROTONIX   Take 1 tablet (40 mg total) by mouth 2 (two) times daily before a meal.      simvastatin 20 MG tablet   Commonly known as: ZOCOR   Take 20 mg by mouth every evening.      timolol 0.5 % ophthalmic gel-forming   Commonly known as: TIMOPTIC-XR   Place 1 drop into both eyes daily.      travoprost (benzalkonium) 0.004 % ophthalmic solution   Commonly known as: TRAVATAN   Place 1 drop into both eyes at bedtime.      traZODone 50 MG tablet   Commonly known as: DESYREL   Take 150 mg by mouth at bedtime.             BRIEF HPI:  See H&P, Labs, Consult and Test  reports for all details in brief, patient was admitted for melanotic stools. Apparently has a history of diarrhea for the past several months as well. For further details please see the history and physical that was done on admission.  CONSULTATIONS:   GI  PERTINENT RADIOLOGIC STUDIES: Dg Chest Port 1 View  10/11/2011  *RADIOLOGY REPORT*  Clinical Data: Cough, shortness of breath and chest pain.  PORTABLE CHEST - 1 VIEW  Comparison: None.  Findings: The lungs are well-aerated and clear.  There is no evidence of focal opacification, pleural effusion or pneumothorax.  The cardiomediastinal silhouette is normal in size; calcification is noted within the aortic arch.  No acute osseous abnormalities are seen.  Osteophytes are noted at both humeral heads.  IMPRESSION: No acute cardiopulmonary process seen.  Original Report Authenticated By: Tonia Ghent, M.D.     PERTINENT LAB RESULTS: CBC:  Basename 10/12/11 2333 10/12/11 0907 10/11/11 2050  WBC -- 11.0* 15.2*  HGB 8.9* 8.9* --  HCT 26.1* 26.7* --  PLT -- 306 298   CMET CMP     Component Value Date/Time   NA 140 10/12/2011 0907   K 4.2 10/12/2011 0907   CL  110 10/12/2011 0907   CO2 24 10/12/2011 0907   GLUCOSE 97 10/12/2011 0907   BUN 13 10/12/2011 0907   CREATININE 0.76 10/12/2011 0907   CALCIUM 8.6 10/12/2011 0907   PROT 6.6 10/09/2011 1443   ALBUMIN 3.1* 10/09/2011 1443   AST 14 10/09/2011 1443   ALT 9 10/09/2011 1443   ALKPHOS 57 10/09/2011 1443   BILITOT 0.2* 10/09/2011 1443   GFRNONAA 76* 10/12/2011 0907   GFRAA 88* 10/12/2011 0907    GFR Estimated Creatinine Clearance: 44.1 ml/min (by C-G formula based on Cr of 0.76). No results found for this basename: LIPASE:2,AMYLASE:2 in the last 72 hours  Basename 10/11/11 2126 10/10/11 1253  CKTOTAL 71 148  CKMB 2.8 6.1*  CKMBINDEX -- --  TROPONINI <0.30 <0.30   No components found with this basename: POCBNP:3 No results found for this basename: DDIMER:2 in the last 72 hours No results found for this basename: HGBA1C:2 in the last 72 hours No results found for this basename: CHOL:2,HDL:2,LDLCALC:2,TRIG:2,CHOLHDL:2,LDLDIRECT:2 in the last 72 hours No results found for this basename: TSH,T4TOTAL,FREET3,T3FREE,THYROIDAB in the last 72 hours No results found for this basename: VITAMINB12:2,FOLATE:2,FERRITIN:2,TIBC:2,IRON:2,RETICCTPCT:2 in the last 72 hours Coags: No results found for this basename: PT:2,INR:2 in the last 72 hours Microbiology: Recent Results (from the past 240 hour(s))  CLOSTRIDIUM DIFFICILE BY PCR     Status: Normal   Collection Time   10/10/11  2:20 AM      Component Value Range Status Comment   C difficile by pcr NEGATIVE  NEGATIVE  Final      BRIEF HOSPITAL COURSE:   Principal Problem:  GI bleed  -upper GI Bleed-seems to have resolved, EGD done on 10/10/2011 showed blood in the body and antrum of the stomach which prevented better visualization, the patient was placed on a Protonix infusion, with clinical improvement bleeding slowly resolved. A repeat second look endoscopy was done on 10/12/2011 showed no signs of active bleeding. Biopsies were obtained from the small bowel  for celiac disease currently pending. -She is now being transitioned to oral Protonix, she is tolerating a regular diet and is thought stable to be discharged to skilled nursing facility for rehabilitation. -She should stay off all antiplatelet agents for at least 2-3 weeks, and should be resumed after discussing with GI.  Anemia  due to blood loss, acute  -due to above  -s/p 2 units of PRBC transfusion -Hemoglobin and hematocrit have been stable for the past 2-3 days.  Chronic Diarrhea  -Going on over several months  -stool C Diff PCR negative  -will defer further work up to the outpatient setting-flexible sigmoidoscopy was done on 10/12/2011, biopsies were obtained and these are currently pending.  HTN  -relatively good control  -continue with lopressor-will need to add other agents as tolerated and as clinical improvement continues  HTN  -relatively good control  -continue with lopressor-will add other agents as tolerated and as clinical improvement continues   Dyslipidemia  -continue with Statins  Leukocytosis  -resolved -no foci of infection was apparent, no antibiotics were started  CAD (coronary artery disease)  -avoid antiplatelet agents given ongoing GI bleed-would need ASA to be stopped for atleast 2-3 weeks   TODAY-DAY OF DISCHARGE:  Subjective:   Ellen Marshall today has no headache,no chest abdominal pain,no new weakness tingling or numbness, feels much better wants to go home today. , Stools have resolved and she is tolerating a regular diet.  Objective:   Blood pressure 136/67, pulse 71, temperature 97.9 F (36.6 C), temperature source Oral, resp. rate 22, height 5\' 3"  (1.6 m), weight 60.782 kg (134 lb), SpO2 100.00%.  Intake/Output Summary (Last 24 hours) at 10/13/11 1021 Last data filed at 10/13/11 0900  Gross per 24 hour  Intake 1083.33 ml  Output      0 ml  Net 1083.33 ml    Exam Awake Alert, Oriented *3, No new F.N deficits, Normal  affect [place].AT,PERRAL Supple Neck,No JVD, No cervical lymphadenopathy appriciated.  Symmetrical Chest wall movement, Good air movement bilaterally, CTAB RRR,No Gallops,Rubs or new Murmurs, No Parasternal Heave +ve B.Sounds, Abd Soft, Non tender, No organomegaly appriciated, No rebound -guarding or rigidity. No Cyanosis, Clubbing or edema, No new Rash or bruise  DISPOSITION: SNF for rehab   DISCHARGE INSTRUCTIONS:    Follow-up Information    Schedule an appointment as soon as possible for a visit with WALSH,CATHERINE, MD. (1-2 weeks from discharge from SNF)       Follow up with EDWARDS JR,JAMES L, MD. Schedule an appointment as soon as possible for a visit in 1 week. (Needs appointment to follow up EGD/Sigmoidoscopy biopsy results)    Contact information:   [address], [address] [employer], [place]. [place] [address] [phone]         Total Time spent on discharge equals 45 minutes.  SignedJeoffrey Massed 10/13/2011 10:21 AM

## 2011-10-13 NOTE — Progress Notes (Signed)
[facility] PROGRESS NOTE Subjective Patient doing well. In the room with her son. Patient comes to the records. Stools are loose but not frank diarrhea. Path results are still pending from yesterday.  Objective: Vital signs in last 24 hours: Temp:  [97.3 F (36.3 C)-98.1 F (36.7 C)] 97.9 F (36.6 C) (03/29 0710) Pulse Rate:  [68-79] 71  (03/29 0710) Resp:  [14-99] 22  (03/29 0710) BP: (134-185)/(55-87) 136/67 mmHg (03/29 0710) SpO2:  [99 %-100 %] 100 % (03/29 0710) Last BM Date: 10/11/11  Intake/Output from previous day: 03/28 0701 - 03/29 0700 In: 723.3 [P.O.:240; I.V.:483.3] Out: -  Intake/Output this shift: Total I/O In: 360 [P.O.:360] Out: -     Lab Results:  Basename 10/12/11 2333 10/12/11 0907 10/11/11 2050 10/11/11 0600 10/10/11 1955 10/10/11 1252  WBC -- 11.0* 15.2* 12.6* 17.4* 14.6*  HGB 8.9* 8.9* 8.8* 9.0* 9.7* --  HCT 26.1* 26.7* 25.5* 26.6* 28.2* --  PLT -- 306 298 294 294 316   BMET  Basename 10/12/11 0907 10/11/11 0524  NA 140 142  K 4.2 3.4*  CL 110 115*  CO2 24 20  CREATININE 0.76 0.74   LFT No results found for this basename: PROT:3ALBUMIN:3,AST:3,ALT:3,ALKPHOS:3,BILITOT:3,BILIDIR:3,IBILI:3 in the last 72 hours PT/INR No results found for this basename: LABPROT:3,INR:3 in the last 72 hours PANCREAS No results found for this basename: LIPASE:3 in the last 72 hours       Studies/Results: Dg Chest Port 1 View  10/11/2011  *RADIOLOGY REPORT*  Clinical Data: Cough, shortness of breath and chest pain.  PORTABLE CHEST - 1 VIEW  Comparison: None.  Findings: The lungs are well-aerated and clear.  There is no evidence of focal opacification, pleural effusion or pneumothorax.  The cardiomediastinal silhouette is normal in size; calcification is noted within the aortic arch.  No acute osseous abnormalities are seen.  Osteophytes are noted at both humeral heads.  IMPRESSION: No acute cardiopulmonary process seen.  Original Report  Authenticated By: Tonia Ghent, M.D.    Medications: I have reviewed the patient's current medications.  Assessment/Plan: 1, GI Bleed. No gross bleeding hemoglobin has been stable the past several days. EGD yesterday negative. Suspect she probably has gastric AVM. Unfortunately, we could not see this at the time of EGD. 2. Chronic Diarrhea. Biopsies of the small bowel and colon are pending.  Plan: Patient appears to be stable and should be able to be transferred to nursing home when bed available. We will follow up in her path results. Please call us back for any problems.   Jamelle Noy JR,Daxton Nydam L 10/13/2011, 10:24 AM

## 2011-10-16 ENCOUNTER — Encounter (HOSPITAL_COMMUNITY): Payer: Self-pay | Admitting: Gastroenterology

## 2011-10-16 DIAGNOSIS — K922 Gastrointestinal hemorrhage, unspecified: Secondary | ICD-10-CM | POA: Diagnosis not present

## 2011-10-16 DIAGNOSIS — I10 Essential (primary) hypertension: Secondary | ICD-10-CM | POA: Diagnosis not present

## 2011-10-16 DIAGNOSIS — R197 Diarrhea, unspecified: Secondary | ICD-10-CM | POA: Diagnosis not present

## 2011-10-16 DIAGNOSIS — D62 Acute posthemorrhagic anemia: Secondary | ICD-10-CM | POA: Diagnosis not present

## 2011-10-16 DIAGNOSIS — M6281 Muscle weakness (generalized): Secondary | ICD-10-CM | POA: Diagnosis not present

## 2011-10-16 DIAGNOSIS — I2510 Atherosclerotic heart disease of native coronary artery without angina pectoris: Secondary | ICD-10-CM | POA: Diagnosis not present

## 2011-11-03 DIAGNOSIS — E785 Hyperlipidemia, unspecified: Secondary | ICD-10-CM | POA: Diagnosis not present

## 2011-11-03 DIAGNOSIS — K922 Gastrointestinal hemorrhage, unspecified: Secondary | ICD-10-CM | POA: Diagnosis not present

## 2011-11-03 DIAGNOSIS — I10 Essential (primary) hypertension: Secondary | ICD-10-CM | POA: Diagnosis not present

## 2011-11-03 DIAGNOSIS — M109 Gout, unspecified: Secondary | ICD-10-CM | POA: Diagnosis not present

## 2011-11-08 DIAGNOSIS — I10 Essential (primary) hypertension: Secondary | ICD-10-CM | POA: Diagnosis not present

## 2011-11-08 DIAGNOSIS — R197 Diarrhea, unspecified: Secondary | ICD-10-CM | POA: Diagnosis not present

## 2011-11-08 DIAGNOSIS — K922 Gastrointestinal hemorrhage, unspecified: Secondary | ICD-10-CM | POA: Diagnosis not present

## 2011-11-08 DIAGNOSIS — I2510 Atherosclerotic heart disease of native coronary artery without angina pectoris: Secondary | ICD-10-CM | POA: Diagnosis not present

## 2011-11-09 DIAGNOSIS — Z5189 Encounter for other specified aftercare: Secondary | ICD-10-CM | POA: Diagnosis not present

## 2011-11-09 DIAGNOSIS — D62 Acute posthemorrhagic anemia: Secondary | ICD-10-CM | POA: Diagnosis not present

## 2011-11-09 DIAGNOSIS — M6281 Muscle weakness (generalized): Secondary | ICD-10-CM | POA: Diagnosis not present

## 2011-11-09 DIAGNOSIS — R262 Difficulty in walking, not elsewhere classified: Secondary | ICD-10-CM | POA: Diagnosis not present

## 2011-11-09 DIAGNOSIS — I2510 Atherosclerotic heart disease of native coronary artery without angina pectoris: Secondary | ICD-10-CM | POA: Diagnosis not present

## 2011-11-13 DIAGNOSIS — Z5189 Encounter for other specified aftercare: Secondary | ICD-10-CM | POA: Diagnosis not present

## 2011-11-13 DIAGNOSIS — I2510 Atherosclerotic heart disease of native coronary artery without angina pectoris: Secondary | ICD-10-CM | POA: Diagnosis not present

## 2011-11-13 DIAGNOSIS — D62 Acute posthemorrhagic anemia: Secondary | ICD-10-CM | POA: Diagnosis not present

## 2011-11-13 DIAGNOSIS — M6281 Muscle weakness (generalized): Secondary | ICD-10-CM | POA: Diagnosis not present

## 2011-11-13 DIAGNOSIS — R262 Difficulty in walking, not elsewhere classified: Secondary | ICD-10-CM | POA: Diagnosis not present

## 2011-11-15 DIAGNOSIS — D62 Acute posthemorrhagic anemia: Secondary | ICD-10-CM | POA: Diagnosis not present

## 2011-11-15 DIAGNOSIS — M6281 Muscle weakness (generalized): Secondary | ICD-10-CM | POA: Diagnosis not present

## 2011-11-15 DIAGNOSIS — I2510 Atherosclerotic heart disease of native coronary artery without angina pectoris: Secondary | ICD-10-CM | POA: Diagnosis not present

## 2011-11-15 DIAGNOSIS — R262 Difficulty in walking, not elsewhere classified: Secondary | ICD-10-CM | POA: Diagnosis not present

## 2011-11-15 DIAGNOSIS — Z5189 Encounter for other specified aftercare: Secondary | ICD-10-CM | POA: Diagnosis not present

## 2011-11-22 DIAGNOSIS — I10 Essential (primary) hypertension: Secondary | ICD-10-CM | POA: Diagnosis not present

## 2011-11-22 DIAGNOSIS — R195 Other fecal abnormalities: Secondary | ICD-10-CM | POA: Diagnosis not present

## 2011-11-22 DIAGNOSIS — Z5189 Encounter for other specified aftercare: Secondary | ICD-10-CM | POA: Diagnosis not present

## 2011-11-22 DIAGNOSIS — K922 Gastrointestinal hemorrhage, unspecified: Secondary | ICD-10-CM | POA: Diagnosis not present

## 2011-11-22 DIAGNOSIS — D62 Acute posthemorrhagic anemia: Secondary | ICD-10-CM | POA: Diagnosis not present

## 2011-11-22 DIAGNOSIS — M6281 Muscle weakness (generalized): Secondary | ICD-10-CM | POA: Diagnosis not present

## 2011-11-22 DIAGNOSIS — R197 Diarrhea, unspecified: Secondary | ICD-10-CM | POA: Diagnosis not present

## 2011-11-22 DIAGNOSIS — R262 Difficulty in walking, not elsewhere classified: Secondary | ICD-10-CM | POA: Diagnosis not present

## 2011-11-22 DIAGNOSIS — I2510 Atherosclerotic heart disease of native coronary artery without angina pectoris: Secondary | ICD-10-CM | POA: Diagnosis not present

## 2011-11-23 DIAGNOSIS — R262 Difficulty in walking, not elsewhere classified: Secondary | ICD-10-CM | POA: Diagnosis not present

## 2011-11-23 DIAGNOSIS — D62 Acute posthemorrhagic anemia: Secondary | ICD-10-CM | POA: Diagnosis not present

## 2011-11-23 DIAGNOSIS — I2510 Atherosclerotic heart disease of native coronary artery without angina pectoris: Secondary | ICD-10-CM | POA: Diagnosis not present

## 2011-11-23 DIAGNOSIS — Z5189 Encounter for other specified aftercare: Secondary | ICD-10-CM | POA: Diagnosis not present

## 2011-11-23 DIAGNOSIS — M6281 Muscle weakness (generalized): Secondary | ICD-10-CM | POA: Diagnosis not present

## 2011-11-28 DIAGNOSIS — D62 Acute posthemorrhagic anemia: Secondary | ICD-10-CM | POA: Diagnosis not present

## 2011-11-28 DIAGNOSIS — M6281 Muscle weakness (generalized): Secondary | ICD-10-CM | POA: Diagnosis not present

## 2011-11-28 DIAGNOSIS — R262 Difficulty in walking, not elsewhere classified: Secondary | ICD-10-CM | POA: Diagnosis not present

## 2011-11-28 DIAGNOSIS — Z5189 Encounter for other specified aftercare: Secondary | ICD-10-CM | POA: Diagnosis not present

## 2011-11-28 DIAGNOSIS — I2510 Atherosclerotic heart disease of native coronary artery without angina pectoris: Secondary | ICD-10-CM | POA: Diagnosis not present

## 2011-11-30 DIAGNOSIS — M6281 Muscle weakness (generalized): Secondary | ICD-10-CM | POA: Diagnosis not present

## 2011-11-30 DIAGNOSIS — Z5189 Encounter for other specified aftercare: Secondary | ICD-10-CM | POA: Diagnosis not present

## 2011-11-30 DIAGNOSIS — I2510 Atherosclerotic heart disease of native coronary artery without angina pectoris: Secondary | ICD-10-CM | POA: Diagnosis not present

## 2011-11-30 DIAGNOSIS — R262 Difficulty in walking, not elsewhere classified: Secondary | ICD-10-CM | POA: Diagnosis not present

## 2011-11-30 DIAGNOSIS — D62 Acute posthemorrhagic anemia: Secondary | ICD-10-CM | POA: Diagnosis not present

## 2011-12-05 DIAGNOSIS — D62 Acute posthemorrhagic anemia: Secondary | ICD-10-CM | POA: Diagnosis not present

## 2011-12-05 DIAGNOSIS — M6281 Muscle weakness (generalized): Secondary | ICD-10-CM | POA: Diagnosis not present

## 2011-12-05 DIAGNOSIS — I2510 Atherosclerotic heart disease of native coronary artery without angina pectoris: Secondary | ICD-10-CM | POA: Diagnosis not present

## 2011-12-05 DIAGNOSIS — R262 Difficulty in walking, not elsewhere classified: Secondary | ICD-10-CM | POA: Diagnosis not present

## 2011-12-05 DIAGNOSIS — Z5189 Encounter for other specified aftercare: Secondary | ICD-10-CM | POA: Diagnosis not present

## 2011-12-06 DIAGNOSIS — R197 Diarrhea, unspecified: Secondary | ICD-10-CM | POA: Diagnosis not present

## 2011-12-06 DIAGNOSIS — D500 Iron deficiency anemia secondary to blood loss (chronic): Secondary | ICD-10-CM | POA: Diagnosis not present

## 2011-12-06 DIAGNOSIS — I10 Essential (primary) hypertension: Secondary | ICD-10-CM | POA: Diagnosis not present

## 2011-12-08 DIAGNOSIS — Z5189 Encounter for other specified aftercare: Secondary | ICD-10-CM | POA: Diagnosis not present

## 2011-12-08 DIAGNOSIS — I2510 Atherosclerotic heart disease of native coronary artery without angina pectoris: Secondary | ICD-10-CM | POA: Diagnosis not present

## 2011-12-08 DIAGNOSIS — M6281 Muscle weakness (generalized): Secondary | ICD-10-CM | POA: Diagnosis not present

## 2011-12-08 DIAGNOSIS — R262 Difficulty in walking, not elsewhere classified: Secondary | ICD-10-CM | POA: Diagnosis not present

## 2011-12-08 DIAGNOSIS — D62 Acute posthemorrhagic anemia: Secondary | ICD-10-CM | POA: Diagnosis not present

## 2011-12-13 DIAGNOSIS — M6281 Muscle weakness (generalized): Secondary | ICD-10-CM | POA: Diagnosis not present

## 2011-12-13 DIAGNOSIS — Z5189 Encounter for other specified aftercare: Secondary | ICD-10-CM | POA: Diagnosis not present

## 2011-12-13 DIAGNOSIS — D62 Acute posthemorrhagic anemia: Secondary | ICD-10-CM | POA: Diagnosis not present

## 2011-12-13 DIAGNOSIS — R262 Difficulty in walking, not elsewhere classified: Secondary | ICD-10-CM | POA: Diagnosis not present

## 2011-12-13 DIAGNOSIS — I2510 Atherosclerotic heart disease of native coronary artery without angina pectoris: Secondary | ICD-10-CM | POA: Diagnosis not present

## 2011-12-15 DIAGNOSIS — R262 Difficulty in walking, not elsewhere classified: Secondary | ICD-10-CM | POA: Diagnosis not present

## 2011-12-15 DIAGNOSIS — D62 Acute posthemorrhagic anemia: Secondary | ICD-10-CM | POA: Diagnosis not present

## 2011-12-15 DIAGNOSIS — Z5189 Encounter for other specified aftercare: Secondary | ICD-10-CM | POA: Diagnosis not present

## 2011-12-15 DIAGNOSIS — M6281 Muscle weakness (generalized): Secondary | ICD-10-CM | POA: Diagnosis not present

## 2011-12-15 DIAGNOSIS — I2510 Atherosclerotic heart disease of native coronary artery without angina pectoris: Secondary | ICD-10-CM | POA: Diagnosis not present

## 2011-12-19 DIAGNOSIS — M6281 Muscle weakness (generalized): Secondary | ICD-10-CM | POA: Diagnosis not present

## 2011-12-19 DIAGNOSIS — D62 Acute posthemorrhagic anemia: Secondary | ICD-10-CM | POA: Diagnosis not present

## 2011-12-19 DIAGNOSIS — Z5189 Encounter for other specified aftercare: Secondary | ICD-10-CM | POA: Diagnosis not present

## 2011-12-19 DIAGNOSIS — I2510 Atherosclerotic heart disease of native coronary artery without angina pectoris: Secondary | ICD-10-CM | POA: Diagnosis not present

## 2011-12-19 DIAGNOSIS — R262 Difficulty in walking, not elsewhere classified: Secondary | ICD-10-CM | POA: Diagnosis not present

## 2011-12-27 DIAGNOSIS — D62 Acute posthemorrhagic anemia: Secondary | ICD-10-CM | POA: Diagnosis not present

## 2011-12-27 DIAGNOSIS — I2510 Atherosclerotic heart disease of native coronary artery without angina pectoris: Secondary | ICD-10-CM | POA: Diagnosis not present

## 2011-12-27 DIAGNOSIS — R262 Difficulty in walking, not elsewhere classified: Secondary | ICD-10-CM | POA: Diagnosis not present

## 2011-12-27 DIAGNOSIS — M6281 Muscle weakness (generalized): Secondary | ICD-10-CM | POA: Diagnosis not present

## 2011-12-27 DIAGNOSIS — Z5189 Encounter for other specified aftercare: Secondary | ICD-10-CM | POA: Diagnosis not present

## 2011-12-29 DIAGNOSIS — R197 Diarrhea, unspecified: Secondary | ICD-10-CM | POA: Diagnosis not present

## 2012-01-04 DIAGNOSIS — R079 Chest pain, unspecified: Secondary | ICD-10-CM | POA: Diagnosis not present

## 2012-01-04 DIAGNOSIS — R0602 Shortness of breath: Secondary | ICD-10-CM | POA: Diagnosis not present

## 2012-01-11 DIAGNOSIS — R079 Chest pain, unspecified: Secondary | ICD-10-CM | POA: Diagnosis not present

## 2012-02-14 DIAGNOSIS — R197 Diarrhea, unspecified: Secondary | ICD-10-CM | POA: Diagnosis not present

## 2012-02-14 DIAGNOSIS — J329 Chronic sinusitis, unspecified: Secondary | ICD-10-CM | POA: Diagnosis not present

## 2012-02-14 DIAGNOSIS — D649 Anemia, unspecified: Secondary | ICD-10-CM | POA: Diagnosis not present

## 2012-03-05 DIAGNOSIS — H353190 Nonexudative age-related macular degeneration, unspecified eye, stage unspecified: Secondary | ICD-10-CM | POA: Diagnosis not present

## 2012-05-03 DIAGNOSIS — R0602 Shortness of breath: Secondary | ICD-10-CM | POA: Diagnosis not present

## 2012-05-06 DIAGNOSIS — R0609 Other forms of dyspnea: Secondary | ICD-10-CM | POA: Diagnosis not present

## 2012-05-06 DIAGNOSIS — R079 Chest pain, unspecified: Secondary | ICD-10-CM | POA: Diagnosis not present

## 2012-05-06 DIAGNOSIS — R0989 Other specified symptoms and signs involving the circulatory and respiratory systems: Secondary | ICD-10-CM | POA: Diagnosis not present

## 2012-05-06 DIAGNOSIS — I10 Essential (primary) hypertension: Secondary | ICD-10-CM | POA: Diagnosis not present

## 2012-05-06 DIAGNOSIS — I679 Cerebrovascular disease, unspecified: Secondary | ICD-10-CM | POA: Diagnosis not present

## 2012-05-07 ENCOUNTER — Ambulatory Visit (HOSPITAL_COMMUNITY)
Admission: RE | Admit: 2012-05-07 | Discharge: 2012-05-07 | Disposition: A | Discharge status: Home / Self Care | Payer: Medicare Other | Source: Ambulatory Visit | Attending: Interventional Cardiology | Admitting: Interventional Cardiology

## 2012-05-07 DIAGNOSIS — I079 Rheumatic tricuspid valve disease, unspecified: Secondary | ICD-10-CM | POA: Insufficient documentation

## 2012-05-07 DIAGNOSIS — I379 Nonrheumatic pulmonary valve disorder, unspecified: Secondary | ICD-10-CM | POA: Diagnosis not present

## 2012-05-07 DIAGNOSIS — R1110 Vomiting, unspecified: Secondary | ICD-10-CM | POA: Diagnosis not present

## 2012-05-07 DIAGNOSIS — R197 Diarrhea, unspecified: Secondary | ICD-10-CM | POA: Diagnosis not present

## 2012-05-07 DIAGNOSIS — D649 Anemia, unspecified: Secondary | ICD-10-CM | POA: Diagnosis not present

## 2012-05-07 DIAGNOSIS — I059 Rheumatic mitral valve disease, unspecified: Secondary | ICD-10-CM | POA: Insufficient documentation

## 2012-05-07 DIAGNOSIS — R0602 Shortness of breath: Secondary | ICD-10-CM | POA: Diagnosis not present

## 2012-05-07 NOTE — Progress Notes (Signed)
*  PRELIMINARY RESULTS* Echocardiogram 2D Echocardiogram has been performed.  Jeryl Columbia 05/07/2012, 9:28 AM

## 2012-05-15 DIAGNOSIS — R0609 Other forms of dyspnea: Secondary | ICD-10-CM | POA: Diagnosis not present

## 2012-05-15 DIAGNOSIS — Z79899 Other long term (current) drug therapy: Secondary | ICD-10-CM | POA: Diagnosis not present

## 2012-05-15 DIAGNOSIS — R0989 Other specified symptoms and signs involving the circulatory and respiratory systems: Secondary | ICD-10-CM | POA: Diagnosis not present

## 2012-05-15 DIAGNOSIS — Z23 Encounter for immunization: Secondary | ICD-10-CM | POA: Diagnosis not present

## 2012-05-15 DIAGNOSIS — I10 Essential (primary) hypertension: Secondary | ICD-10-CM | POA: Diagnosis not present

## 2012-05-15 DIAGNOSIS — M949 Disorder of cartilage, unspecified: Secondary | ICD-10-CM | POA: Diagnosis not present

## 2012-05-15 DIAGNOSIS — M899 Disorder of bone, unspecified: Secondary | ICD-10-CM | POA: Diagnosis not present

## 2012-05-15 DIAGNOSIS — R197 Diarrhea, unspecified: Secondary | ICD-10-CM | POA: Diagnosis not present

## 2012-05-21 DIAGNOSIS — R29890 Loss of height: Secondary | ICD-10-CM | POA: Diagnosis not present

## 2012-05-21 DIAGNOSIS — M899 Disorder of bone, unspecified: Secondary | ICD-10-CM | POA: Diagnosis not present

## 2012-06-04 DIAGNOSIS — Z961 Presence of intraocular lens: Secondary | ICD-10-CM | POA: Diagnosis not present

## 2012-06-04 DIAGNOSIS — H5210 Myopia, unspecified eye: Secondary | ICD-10-CM | POA: Diagnosis not present

## 2012-06-04 DIAGNOSIS — H52209 Unspecified astigmatism, unspecified eye: Secondary | ICD-10-CM | POA: Diagnosis not present

## 2012-06-04 DIAGNOSIS — H524 Presbyopia: Secondary | ICD-10-CM | POA: Diagnosis not present

## 2012-06-04 DIAGNOSIS — H3530 Unspecified macular degeneration: Secondary | ICD-10-CM | POA: Diagnosis not present

## 2012-06-19 DIAGNOSIS — H3530 Unspecified macular degeneration: Secondary | ICD-10-CM | POA: Diagnosis not present

## 2012-06-19 DIAGNOSIS — IMO0001 Reserved for inherently not codable concepts without codable children: Secondary | ICD-10-CM | POA: Diagnosis not present

## 2012-06-19 DIAGNOSIS — H543 Unqualified visual loss, both eyes: Secondary | ICD-10-CM | POA: Diagnosis not present

## 2012-06-20 DIAGNOSIS — IMO0001 Reserved for inherently not codable concepts without codable children: Secondary | ICD-10-CM | POA: Diagnosis not present

## 2012-06-20 DIAGNOSIS — H3530 Unspecified macular degeneration: Secondary | ICD-10-CM | POA: Diagnosis not present

## 2012-06-20 DIAGNOSIS — H543 Unqualified visual loss, both eyes: Secondary | ICD-10-CM | POA: Diagnosis not present

## 2012-06-25 DIAGNOSIS — R0989 Other specified symptoms and signs involving the circulatory and respiratory systems: Secondary | ICD-10-CM | POA: Diagnosis not present

## 2012-06-25 DIAGNOSIS — E785 Hyperlipidemia, unspecified: Secondary | ICD-10-CM | POA: Diagnosis not present

## 2012-06-25 DIAGNOSIS — I10 Essential (primary) hypertension: Secondary | ICD-10-CM | POA: Diagnosis not present

## 2012-06-25 DIAGNOSIS — R0609 Other forms of dyspnea: Secondary | ICD-10-CM | POA: Diagnosis not present

## 2012-06-25 DIAGNOSIS — I679 Cerebrovascular disease, unspecified: Secondary | ICD-10-CM | POA: Diagnosis not present

## 2012-06-25 DIAGNOSIS — R079 Chest pain, unspecified: Secondary | ICD-10-CM | POA: Diagnosis not present

## 2012-07-15 DIAGNOSIS — R109 Unspecified abdominal pain: Secondary | ICD-10-CM | POA: Diagnosis not present

## 2012-07-15 DIAGNOSIS — I10 Essential (primary) hypertension: Secondary | ICD-10-CM | POA: Diagnosis not present

## 2012-07-15 DIAGNOSIS — IMO0001 Reserved for inherently not codable concepts without codable children: Secondary | ICD-10-CM | POA: Diagnosis not present

## 2012-07-15 DIAGNOSIS — R5381 Other malaise: Secondary | ICD-10-CM | POA: Diagnosis not present

## 2012-07-25 DIAGNOSIS — R0609 Other forms of dyspnea: Secondary | ICD-10-CM | POA: Diagnosis not present

## 2012-07-25 DIAGNOSIS — I10 Essential (primary) hypertension: Secondary | ICD-10-CM | POA: Diagnosis not present

## 2012-07-25 DIAGNOSIS — R0989 Other specified symptoms and signs involving the circulatory and respiratory systems: Secondary | ICD-10-CM | POA: Diagnosis not present

## 2012-07-25 DIAGNOSIS — I5032 Chronic diastolic (congestive) heart failure: Secondary | ICD-10-CM | POA: Diagnosis not present

## 2012-07-29 DIAGNOSIS — J45991 Cough variant asthma: Secondary | ICD-10-CM | POA: Diagnosis not present

## 2012-07-29 DIAGNOSIS — J209 Acute bronchitis, unspecified: Secondary | ICD-10-CM | POA: Diagnosis not present

## 2012-08-21 DIAGNOSIS — I10 Essential (primary) hypertension: Secondary | ICD-10-CM | POA: Diagnosis not present

## 2012-09-17 DIAGNOSIS — J45991 Cough variant asthma: Secondary | ICD-10-CM | POA: Diagnosis not present

## 2012-10-22 DIAGNOSIS — Z79899 Other long term (current) drug therapy: Secondary | ICD-10-CM | POA: Diagnosis not present

## 2012-10-22 DIAGNOSIS — R079 Chest pain, unspecified: Secondary | ICD-10-CM | POA: Diagnosis not present

## 2012-10-22 DIAGNOSIS — H52209 Unspecified astigmatism, unspecified eye: Secondary | ICD-10-CM | POA: Diagnosis not present

## 2012-10-22 DIAGNOSIS — H3530 Unspecified macular degeneration: Secondary | ICD-10-CM | POA: Diagnosis not present

## 2012-10-22 DIAGNOSIS — I10 Essential (primary) hypertension: Secondary | ICD-10-CM | POA: Diagnosis not present

## 2012-10-22 DIAGNOSIS — H524 Presbyopia: Secondary | ICD-10-CM | POA: Diagnosis not present

## 2013-01-16 DIAGNOSIS — I10 Essential (primary) hypertension: Secondary | ICD-10-CM | POA: Diagnosis not present

## 2013-01-16 DIAGNOSIS — J45991 Cough variant asthma: Secondary | ICD-10-CM | POA: Diagnosis not present

## 2013-01-16 DIAGNOSIS — Z1331 Encounter for screening for depression: Secondary | ICD-10-CM | POA: Diagnosis not present

## 2013-01-16 DIAGNOSIS — R5382 Chronic fatigue, unspecified: Secondary | ICD-10-CM | POA: Diagnosis not present

## 2013-01-16 DIAGNOSIS — R079 Chest pain, unspecified: Secondary | ICD-10-CM | POA: Diagnosis not present

## 2013-01-16 DIAGNOSIS — Z0000 Encounter for general adult medical examination without abnormal findings: Secondary | ICD-10-CM | POA: Diagnosis not present

## 2013-01-16 DIAGNOSIS — R1031 Right lower quadrant pain: Secondary | ICD-10-CM | POA: Diagnosis not present

## 2013-01-16 DIAGNOSIS — Z23 Encounter for immunization: Secondary | ICD-10-CM | POA: Diagnosis not present

## 2013-01-22 DIAGNOSIS — R0989 Other specified symptoms and signs involving the circulatory and respiratory systems: Secondary | ICD-10-CM | POA: Diagnosis not present

## 2013-01-22 DIAGNOSIS — I10 Essential (primary) hypertension: Secondary | ICD-10-CM | POA: Diagnosis not present

## 2013-01-22 DIAGNOSIS — I5032 Chronic diastolic (congestive) heart failure: Secondary | ICD-10-CM | POA: Diagnosis not present

## 2013-01-22 DIAGNOSIS — R0609 Other forms of dyspnea: Secondary | ICD-10-CM | POA: Diagnosis not present

## 2013-01-22 DIAGNOSIS — R079 Chest pain, unspecified: Secondary | ICD-10-CM | POA: Diagnosis not present

## 2013-01-29 DIAGNOSIS — R1031 Right lower quadrant pain: Secondary | ICD-10-CM | POA: Diagnosis not present

## 2013-01-29 DIAGNOSIS — R197 Diarrhea, unspecified: Secondary | ICD-10-CM | POA: Diagnosis not present

## 2013-04-13 DIAGNOSIS — Z23 Encounter for immunization: Secondary | ICD-10-CM | POA: Diagnosis not present

## 2013-05-05 DIAGNOSIS — H353190 Nonexudative age-related macular degeneration, unspecified eye, stage unspecified: Secondary | ICD-10-CM | POA: Diagnosis not present

## 2013-05-05 DIAGNOSIS — H409 Unspecified glaucoma: Secondary | ICD-10-CM | POA: Diagnosis not present

## 2013-05-05 DIAGNOSIS — H4011X0 Primary open-angle glaucoma, stage unspecified: Secondary | ICD-10-CM | POA: Diagnosis not present

## 2013-05-27 DIAGNOSIS — R197 Diarrhea, unspecified: Secondary | ICD-10-CM | POA: Diagnosis not present

## 2013-06-10 DIAGNOSIS — R197 Diarrhea, unspecified: Secondary | ICD-10-CM | POA: Diagnosis not present

## 2013-08-06 ENCOUNTER — Other Ambulatory Visit: Payer: Self-pay | Admitting: Internal Medicine

## 2013-08-06 ENCOUNTER — Ambulatory Visit
Admission: RE | Admit: 2013-08-06 | Discharge: 2013-08-06 | Disposition: A | Discharge status: Home / Self Care | Payer: Medicare Other | Source: Ambulatory Visit | Attending: Internal Medicine | Admitting: Internal Medicine

## 2013-08-06 DIAGNOSIS — R0600 Dyspnea, unspecified: Secondary | ICD-10-CM

## 2013-08-06 DIAGNOSIS — J984 Other disorders of lung: Secondary | ICD-10-CM | POA: Diagnosis not present

## 2013-08-06 DIAGNOSIS — R0609 Other forms of dyspnea: Secondary | ICD-10-CM | POA: Diagnosis not present

## 2013-08-06 DIAGNOSIS — J189 Pneumonia, unspecified organism: Secondary | ICD-10-CM | POA: Diagnosis not present

## 2013-08-06 DIAGNOSIS — R079 Chest pain, unspecified: Secondary | ICD-10-CM | POA: Diagnosis not present

## 2013-08-12 ENCOUNTER — Emergency Department (HOSPITAL_COMMUNITY): Payer: Medicare Other

## 2013-08-12 ENCOUNTER — Encounter (HOSPITAL_COMMUNITY): Payer: Self-pay | Admitting: Emergency Medicine

## 2013-08-12 ENCOUNTER — Observation Stay (HOSPITAL_COMMUNITY)
Admission: EM | Admit: 2013-08-12 | Discharge: 2013-08-14 | Disposition: A | Discharge status: Home / Self Care | Payer: Medicare Other | Attending: Internal Medicine | Admitting: Internal Medicine

## 2013-08-12 DIAGNOSIS — E869 Volume depletion, unspecified: Secondary | ICD-10-CM | POA: Diagnosis not present

## 2013-08-12 DIAGNOSIS — I10 Essential (primary) hypertension: Secondary | ICD-10-CM | POA: Diagnosis not present

## 2013-08-12 DIAGNOSIS — R55 Syncope and collapse: Secondary | ICD-10-CM | POA: Diagnosis not present

## 2013-08-12 DIAGNOSIS — R42 Dizziness and giddiness: Secondary | ICD-10-CM | POA: Diagnosis not present

## 2013-08-12 DIAGNOSIS — S0003XA Contusion of scalp, initial encounter: Secondary | ICD-10-CM

## 2013-08-12 DIAGNOSIS — E785 Hyperlipidemia, unspecified: Secondary | ICD-10-CM | POA: Diagnosis not present

## 2013-08-12 DIAGNOSIS — R197 Diarrhea, unspecified: Secondary | ICD-10-CM | POA: Insufficient documentation

## 2013-08-12 DIAGNOSIS — S298XXA Other specified injuries of thorax, initial encounter: Secondary | ICD-10-CM | POA: Diagnosis not present

## 2013-08-12 DIAGNOSIS — R51 Headache: Secondary | ICD-10-CM | POA: Diagnosis not present

## 2013-08-12 DIAGNOSIS — K219 Gastro-esophageal reflux disease without esophagitis: Secondary | ICD-10-CM | POA: Insufficient documentation

## 2013-08-12 DIAGNOSIS — Y9210 Unspecified residential institution as the place of occurrence of the external cause: Secondary | ICD-10-CM | POA: Insufficient documentation

## 2013-08-12 DIAGNOSIS — I498 Other specified cardiac arrhythmias: Secondary | ICD-10-CM | POA: Diagnosis not present

## 2013-08-12 DIAGNOSIS — Z8673 Personal history of transient ischemic attack (TIA), and cerebral infarction without residual deficits: Secondary | ICD-10-CM | POA: Insufficient documentation

## 2013-08-12 DIAGNOSIS — S1093XA Contusion of unspecified part of neck, initial encounter: Secondary | ICD-10-CM

## 2013-08-12 DIAGNOSIS — S0990XA Unspecified injury of head, initial encounter: Secondary | ICD-10-CM | POA: Diagnosis not present

## 2013-08-12 DIAGNOSIS — M109 Gout, unspecified: Secondary | ICD-10-CM | POA: Diagnosis not present

## 2013-08-12 DIAGNOSIS — N179 Acute kidney failure, unspecified: Secondary | ICD-10-CM | POA: Diagnosis not present

## 2013-08-12 DIAGNOSIS — S0083XA Contusion of other part of head, initial encounter: Secondary | ICD-10-CM

## 2013-08-12 DIAGNOSIS — Z79899 Other long term (current) drug therapy: Secondary | ICD-10-CM | POA: Diagnosis not present

## 2013-08-12 DIAGNOSIS — T1490XA Injury, unspecified, initial encounter: Secondary | ICD-10-CM | POA: Diagnosis not present

## 2013-08-12 DIAGNOSIS — J189 Pneumonia, unspecified organism: Secondary | ICD-10-CM | POA: Diagnosis not present

## 2013-08-12 DIAGNOSIS — I2510 Atherosclerotic heart disease of native coronary artery without angina pectoris: Secondary | ICD-10-CM | POA: Insufficient documentation

## 2013-08-12 DIAGNOSIS — M542 Cervicalgia: Secondary | ICD-10-CM | POA: Diagnosis not present

## 2013-08-12 DIAGNOSIS — S0993XA Unspecified injury of face, initial encounter: Secondary | ICD-10-CM | POA: Diagnosis not present

## 2013-08-12 DIAGNOSIS — R001 Bradycardia, unspecified: Secondary | ICD-10-CM

## 2013-08-12 DIAGNOSIS — W19XXXA Unspecified fall, initial encounter: Secondary | ICD-10-CM | POA: Insufficient documentation

## 2013-08-12 HISTORY — DX: Cerebral infarction, unspecified: I63.9

## 2013-08-12 LAB — POCT I-STAT TROPONIN I: Troponin i, poc: 0 ng/mL (ref 0.00–0.08)

## 2013-08-12 LAB — URINALYSIS, ROUTINE W REFLEX MICROSCOPIC
Bilirubin Urine: NEGATIVE
Glucose, UA: NEGATIVE mg/dL
Hgb urine dipstick: NEGATIVE
KETONES UR: NEGATIVE mg/dL
NITRITE: NEGATIVE
PROTEIN: NEGATIVE mg/dL
Specific Gravity, Urine: 1.01 (ref 1.005–1.030)
Urobilinogen, UA: 0.2 mg/dL (ref 0.0–1.0)
pH: 5.5 (ref 5.0–8.0)

## 2013-08-12 LAB — BASIC METABOLIC PANEL
BUN: 21 mg/dL (ref 6–23)
CALCIUM: 8.4 mg/dL (ref 8.4–10.5)
CO2: 22 mEq/L (ref 19–32)
CREATININE: 1.15 mg/dL — AB (ref 0.50–1.10)
Chloride: 101 mEq/L (ref 96–112)
GFR, EST AFRICAN AMERICAN: 49 mL/min — AB (ref >90)
GFR, EST NON AFRICAN AMERICAN: 42 mL/min — AB (ref >90)
Glucose, Bld: 116 mg/dL — ABNORMAL HIGH (ref 70–99)
Potassium: 4.5 mEq/L (ref 3.7–5.3)
Sodium: 135 mEq/L — ABNORMAL LOW (ref 137–147)

## 2013-08-12 LAB — CBC
HCT: 38 % (ref 36.0–46.0)
Hemoglobin: 12.6 g/dL (ref 12.0–15.0)
MCH: 32.2 pg (ref 26.0–34.0)
MCHC: 33.2 g/dL (ref 30.0–36.0)
MCV: 97.2 fL (ref 78.0–100.0)
PLATELETS: 387 10*3/uL (ref 150–400)
RBC: 3.91 MIL/uL (ref 3.87–5.11)
RDW: 15 % (ref 11.5–15.5)
WBC: 12.5 10*3/uL — ABNORMAL HIGH (ref 4.0–10.5)

## 2013-08-12 LAB — URINE MICROSCOPIC-ADD ON

## 2013-08-12 MED ORDER — PANTOPRAZOLE SODIUM 40 MG PO TBEC
40.0000 mg | DELAYED_RELEASE_TABLET | Freq: Two times a day (BID) | ORAL | Status: DC
  Administered 2013-08-13 – 2013-08-14 (×3): 40 mg via ORAL
  Filled 2013-08-12 (×6): qty 1

## 2013-08-12 MED ORDER — AZITHROMYCIN 500 MG IV SOLR
500.0000 mg | Freq: Every day | INTRAVENOUS | Status: DC
  Administered 2013-08-12: 500 mg via INTRAVENOUS
  Filled 2013-08-12: qty 500

## 2013-08-12 MED ORDER — ACETAMINOPHEN 325 MG PO TABS
650.0000 mg | ORAL_TABLET | Freq: Four times a day (QID) | ORAL | Status: DC | PRN
  Administered 2013-08-14: 650 mg via ORAL
  Filled 2013-08-12: qty 2

## 2013-08-12 MED ORDER — SODIUM CHLORIDE 0.9 % IJ SOLN
3.0000 mL | Freq: Two times a day (BID) | INTRAMUSCULAR | Status: DC
  Administered 2013-08-13 – 2013-08-14 (×2): 3 mL via INTRAVENOUS

## 2013-08-12 MED ORDER — TIMOLOL MALEATE 0.5 % OP SOLG
1.0000 [drp] | Freq: Every day | OPHTHALMIC | Status: DC
  Administered 2013-08-13 – 2013-08-14 (×2): 1 [drp] via OPHTHALMIC
  Filled 2013-08-12: qty 5

## 2013-08-12 MED ORDER — ALLOPURINOL 100 MG PO TABS
100.0000 mg | ORAL_TABLET | Freq: Every day | ORAL | Status: DC
  Administered 2013-08-12 – 2013-08-13 (×2): 100 mg via ORAL
  Filled 2013-08-12 (×3): qty 1

## 2013-08-12 MED ORDER — TRAZODONE HCL 150 MG PO TABS
150.0000 mg | ORAL_TABLET | Freq: Every day | ORAL | Status: DC
  Administered 2013-08-12 – 2013-08-13 (×2): 150 mg via ORAL
  Filled 2013-08-12 (×3): qty 1

## 2013-08-12 MED ORDER — TRAVOPROST 0.004 % OP SOLN: 1.0000 [drp] | Freq: Every day | OPHTHALMIC | Status: DC

## 2013-08-12 MED ORDER — LORATADINE 10 MG PO TABS
10.0000 mg | ORAL_TABLET | Freq: Every day | ORAL | Status: DC
  Administered 2013-08-13 – 2013-08-14 (×2): 10 mg via ORAL
  Filled 2013-08-12 (×2): qty 1

## 2013-08-12 MED ORDER — ONDANSETRON HCL 4 MG/2ML IJ SOLN: 4.0000 mg | Freq: Four times a day (QID) | INTRAMUSCULAR | Status: DC | PRN

## 2013-08-12 MED ORDER — FERROUS SULFATE 325 (65 FE) MG PO TABS
325.0000 mg | ORAL_TABLET | Freq: Two times a day (BID) | ORAL | Status: DC
  Administered 2013-08-13 – 2013-08-14 (×3): 325 mg via ORAL
  Filled 2013-08-12 (×5): qty 1

## 2013-08-12 MED ORDER — SODIUM CHLORIDE 0.9 % IV BOLUS (SEPSIS)
500.0000 mL | Freq: Once | INTRAVENOUS | Status: AC
  Administered 2013-08-12: 500 mL via INTRAVENOUS

## 2013-08-12 MED ORDER — ISOSORBIDE MONONITRATE ER 60 MG PO TB24
60.0000 mg | ORAL_TABLET | Freq: Every day | ORAL | Status: DC
  Administered 2013-08-13 – 2013-08-14 (×2): 60 mg via ORAL
  Filled 2013-08-12 (×2): qty 1

## 2013-08-12 MED ORDER — DIPHENOXYLATE-ATROPINE 2.5-0.025 MG PO TABS: 2.0000 | ORAL_TABLET | Freq: Four times a day (QID) | ORAL | Status: DC | PRN

## 2013-08-12 MED ORDER — ALPRAZOLAM 0.25 MG PO TABS
0.2500 mg | ORAL_TABLET | Freq: Two times a day (BID) | ORAL | Status: DC
  Administered 2013-08-12 – 2013-08-14 (×4): 0.25 mg via ORAL
  Filled 2013-08-12 (×4): qty 1

## 2013-08-12 MED ORDER — ONDANSETRON HCL 4 MG PO TABS: 4.0000 mg | ORAL_TABLET | Freq: Four times a day (QID) | ORAL | Status: DC | PRN

## 2013-08-12 MED ORDER — ACETAMINOPHEN 650 MG RE SUPP: 650.0000 mg | Freq: Four times a day (QID) | RECTAL | Status: DC | PRN

## 2013-08-12 MED ORDER — SODIUM CHLORIDE 0.9 % IV SOLN
INTRAVENOUS | Status: AC
  Administered 2013-08-12: 23:00:00 via INTRAVENOUS

## 2013-08-12 MED ORDER — TRAVOPROST 0.004 % OP SOLN
1.0000 [drp] | Freq: Every day | OPHTHALMIC | Status: DC
  Filled 2013-08-12: qty 2.5

## 2013-08-12 MED ORDER — CLOPIDOGREL BISULFATE 75 MG PO TABS
75.0000 mg | ORAL_TABLET | Freq: Every day | ORAL | Status: DC
  Administered 2013-08-13 – 2013-08-14 (×2): 75 mg via ORAL
  Filled 2013-08-12 (×3): qty 1

## 2013-08-12 MED ORDER — SACCHAROMYCES BOULARDII 250 MG PO CAPS
250.0000 mg | ORAL_CAPSULE | Freq: Two times a day (BID) | ORAL | Status: DC
  Administered 2013-08-12 – 2013-08-14 (×4): 250 mg via ORAL
  Filled 2013-08-12 (×5): qty 1

## 2013-08-12 MED ORDER — ESCITALOPRAM OXALATE 20 MG PO TABS
20.0000 mg | ORAL_TABLET | Freq: Every morning | ORAL | Status: DC
  Administered 2013-08-13 – 2013-08-14 (×2): 20 mg via ORAL
  Filled 2013-08-12 (×2): qty 1

## 2013-08-12 MED ORDER — DEXTROSE 5 % IV SOLN
1.0000 g | Freq: Every day | INTRAVENOUS | Status: DC
  Administered 2013-08-13: 1 g via INTRAVENOUS
  Filled 2013-08-12: qty 10

## 2013-08-12 MED ORDER — LUTEIN 20 MG PO TABS: 1.0000 | ORAL_TABLET | Freq: Every morning | ORAL | Status: DC

## 2013-08-12 MED ORDER — NITROGLYCERIN 0.4 MG SL SUBL: 0.4000 mg | SUBLINGUAL_TABLET | SUBLINGUAL | Status: DC | PRN

## 2013-08-12 MED ORDER — CALCIUM CARBONATE-VITAMIN D 500-200 MG-UNIT PO TABS
1.0000 | ORAL_TABLET | Freq: Two times a day (BID) | ORAL | Status: DC
  Administered 2013-08-12 – 2013-08-14 (×4): 1 via ORAL
  Filled 2013-08-12 (×5): qty 1

## 2013-08-12 MED ORDER — SODIUM CHLORIDE 0.9 % IV SOLN: INTRAVENOUS | Status: DC

## 2013-08-12 MED ORDER — PANTOPRAZOLE SODIUM 40 MG PO TBEC: 40.0000 mg | DELAYED_RELEASE_TABLET | Freq: Every day | ORAL | Status: DC

## 2013-08-12 NOTE — ED Notes (Signed)
Bed: WA06 Expected date:  Expected time:  Means of arrival:  Comments: 

## 2013-08-12 NOTE — ED Provider Notes (Signed)
CSN: [id number]     Arrival date & time 08/12/13  1728 History   First MD Initiated Contact with Patient 08/12/13 1833     Chief Complaint  Patient presents with  . Fall  . Head Injury   (Consider location/radiation/quality/duration/timing/severity/associated sxs/prior Treatment) HPI Comments: 80 year old female presents after a syncope episode. She does at a facility, but recently was diagnosed with pneumonia and has been on antibiotics. Over the past few days she's been living with her son due to her weakness taken off the caregiver. Patient had diffuse weakness no localizing weakness. The patient is not any headaches. Today she went back to her facility washes in the bathroom standing and brushing her hair she acutely felt lightheaded and passed out. She hit her head on the bathtub. She has some swelling to her right lateral head but no lacerations. Patient denies any weakness or numbness since this. She always has neck pain feels it may be low but worse since injury. Patient has had some chest pain but is unsure if it's related to the cough. She's not any shortness but this time it has been having cough. She's not any urinary symptoms. She's not any vomiting. She has chronic diarrhea but has been worse over the past few days. EMS reported that when they picked the patient up she was orthostatic.   Past Medical History  Diagnosis Date  . Coronary artery disease   . Hypertension   . GERD (gastroesophageal reflux disease)   . Shortness of breath     laying down   Past Surgical History  Procedure Laterality Date  . Colonscopy    . Esophagogastroduodenoscopy    . Stretching of egd    . Abdominal hysterectomy    . Esophagogastroduodenoscopy  10/10/2011    Procedure: ESOPHAGOGASTRODUODENOSCOPY (EGD);  Surgeon: Winfield Cunas., MD;  Location[facility] END[facility]COPY;  Service: Endoscopy;  Laterality: N/A;  with control of bleeding  . Esophagogastroduodenoscopy  10/12/2011    Procedure:  ESOPHAGOGASTRODUODENOSCOPY (EGD);  Surgeon: Winfield Cunas., MD;  Location: [facility] ENDOSCOPY;  Service: Endoscopy;  Laterality: N/A;  . Flexible sigmoidoscopy  10/12/2011    Procedure: FLEXIBLE SIGMOIDOSCOPY;  Surgeon: Winfield Cunas., MD;  Location: [facility] ENDOSCOPY;  Service: Endoscopy;  Laterality: N/A;   History reviewed. No pertinent family history. History  Substance Use Topics  . Smoking status: Never Smoker   . Smokeless tobacco: Not on file  . Alcohol Use: No   OB History   Grav Para Term Preterm Abortions TAB SAB Ect Mult Living                 Review of Systems  Constitutional: Negative for fever and chills.  Respiratory: Positive for cough and shortness of breath.   Cardiovascular: Positive for chest pain. Negative for leg swelling.  Gastrointestinal: Positive for diarrhea. Negative for vomiting and abdominal pain.  Genitourinary: Negative for dysuria.  Neurological: Positive for weakness and headaches.  All other systems reviewed and are negative.    Allergies  Sulfa antibiotics  Home Medications   Current Outpatient Rx  Name  Route  Sig  Dispense  Refill  . Acetaminophen 650 MG TABS   Oral   Take 1,300 mg by mouth 3 (three) times daily.         Marland Kitchen allopurinol (ZYLOPRIM) 100 MG tablet   Oral   Take 100 mg by mouth at bedtime.         . ALPRAZolam (XANAX) 0.25 MG tablet  Oral   Take 0.25 mg by mouth 2 (two) times daily. As needed for anxiety.         . Calcium Carbonate-Vitamin D (CALCIUM + D) 600-200 MG-UNIT TABS   Oral   Take 1 tablet by mouth 2 (two) times daily.         . clopidogrel (PLAVIX) 75 MG tablet   Oral   Take 75 mg by mouth daily with breakfast.         . diltiazem (DILACOR XR) 120 MG 24 hr capsule   Oral   Take 120 mg by mouth daily.         . diphenoxylate-atropine (LOMOTIL) 2.5-0.025 MG per tablet   Oral   Take 2 tablets by mouth 4 (four) times daily as needed for diarrhea or loose stools.          Marland Kitchen escitalopram  (LEXAPRO) 20 MG tablet   Oral   Take 20 mg by mouth every morning.         . ferrous sulfate 325 (65 FE) MG tablet   Oral   Take 325 mg by mouth 2 (two) times daily with a meal.         . fexofenadine (ALLEGRA) 180 MG tablet   Oral   Take 180 mg by mouth daily.         . isosorbide mononitrate (IMDUR) 60 MG 24 hr tablet   Oral   Take 60 mg by mouth daily.         Marland Kitchen levofloxacin (LEVAQUIN) 500 MG tablet   Oral   Take 500 mg by mouth daily. For 10 days.         . Lutein 20 MG TABS   Oral   Take 1 tablet by mouth every morning.         . metoprolol tartrate (LOPRESSOR) 25 MG tablet   Oral   Take 12.5 mg by mouth 2 (two) times daily.         . Multiple Vitamins-Minerals (CENTRUM SILVER PO)   Oral   Take 1 tablet by mouth daily at 12 noon.         . nitroGLYCERIN (NITROSTAT) 0.4 MG SL tablet   Sublingual   Place 0.4 mg under the tongue every 5 (five) minutes as needed. As needed for chest pain.         . pantoprazole (PROTONIX) 40 MG tablet   Oral   Take 40 mg by mouth daily.         Marland Kitchen saccharomyces boulardii (FLORASTOR) 250 MG capsule   Oral   Take 250 mg by mouth 2 (two) times daily.         Marland Kitchen spironolactone (ALDACTONE) 25 MG tablet   Oral   Take 25 mg by mouth. On Monday, Wednesday and Friday.         . timolol (TIMOPTIC-XR) 0.5 % ophthalmic gel-forming   Both Eyes   Place 1 drop into both eyes daily.         . travoprost, benzalkonium, (TRAVATAN) 0.004 % ophthalmic solution   Both Eyes   Place 1 drop into both eyes at bedtime.         . traZODone (DESYREL) 50 MG tablet   Oral   Take 150 mg by mouth at bedtime.         Marland Kitchen EXPIRED: pantoprazole (PROTONIX) 40 MG tablet   Oral   Take 1 tablet (40 mg total) by mouth 2 (two) times daily before  a meal.          BP 118/46  Pulse 51  Temp(Src) 97.5 F (36.4 C) (Oral)  Resp 18  SpO2 96% Physical Exam  Nursing note and vitals reviewed. Constitutional: She is oriented to  person, place, and time. She appears well-developed and well-nourished.  HENT:  Head: Normocephalic.  Right Ear: External ear normal.  Left Ear: External ear normal.  Nose: Nose normal.  Tenderness and hematoma over right parietal scalp  Eyes: EOM are normal. Pupils are equal, round, and reactive to light. Right eye exhibits no discharge. Left eye exhibits no discharge.  Cardiovascular: Regular rhythm and normal heart sounds.  Bradycardia present.   Pulmonary/Chest: Effort normal and breath sounds normal.  Abdominal: Soft. There is no tenderness.  Neurological: She is alert and oriented to person, place, and time. GCS eye subscore is 4. GCS verbal subscore is 5. GCS motor subscore is 6.  5/5 strength in all 4 extremities. CN 2-12 grossly intact  Skin: Skin is warm and dry.    ED Course  Procedures (including critical care time) Labs Review Labs Reviewed  CBC - Abnormal; Notable for the following:    WBC 12.5 (*)    All other components within normal limits  BASIC METABOLIC PANEL - Abnormal; Notable for the following:    Sodium 135 (*)    Glucose, Bld 116 (*)    Creatinine, Ser 1.15 (*)    GFR calc non Af Amer 42 (*)    GFR calc Af Amer 49 (*)    All other components within normal limits  URINALYSIS, ROUTINE W REFLEX MICROSCOPIC - Abnormal; Notable for the following:    APPearance CLOUDY (*)    Leukocytes, UA TRACE (*)    All other components within normal limits  URINE MICROSCOPIC-ADD ON  POCT I-STAT TROPONIN I   Imaging Review Dg Chest 2 View  08/12/2013   CLINICAL DATA:  Fall.  Syncope.  Hypertension.  EXAM: CHEST  2 VIEW  COMPARISON:  DG CHEST 2 VIEW dated 08/06/2013; CT HEAD W/O CM dated 08/12/2013; DG CHEST 2V dated 07/29/2012  FINDINGS: Cardiopericardial silhouette is within normal limits for projection. Aortic arch atherosclerosis. Lung volumes are low with bilateral basilar atelectasis. A abdominal aortic atherosclerosis is incidentally noted. Monitoring leads project over  the chest. No displaced rib fractures or pneumothorax.  IMPRESSION: Low volume chest with basilar atelectasis.  No acute abnormality.   Electronically Signed   By: Dereck Ligas M.D.   On: 08/12/2013 19:50   Ct Head Wo Contrast  08/12/2013   CLINICAL DATA:  History of fall with injury to the right side of the head.  EXAM: CT HEAD WITHOUT CONTRAST  TECHNIQUE: Contiguous axial images were obtained from the base of the skull through the vertex without intravenous contrast.  COMPARISON:  No priors.  FINDINGS: Well-defined focus of low attenuation in the right thalamus compatible with an old lacunar infarction. No acute displaced skull fractures are identified. No acute intracranial abnormality. Specifically, no evidence of acute post-traumatic intracranial hemorrhage, no definite regions of acute/subacute cerebral ischemia, no focal mass, mass effect, hydrocephalus or abnormal intra or extra-axial fluid collections. The visualized paranasal sinuses and mastoids are well pneumatized.  IMPRESSION: 1. No acute displaced skull fractures or acute intracranial abnormalities. 2. Old lacunar infarct in the right thalamus incidentally noted.   Electronically Signed   By: Vinnie Langton M.D.   On: 08/12/2013 18:36   Ct Cervical Spine Wo Contrast  08/12/2013   CLINICAL  DATA:  Fall.  Posterior neck pain.  EXAM: CT CERVICAL SPINE WITHOUT CONTRAST  TECHNIQUE: Multidetector CT imaging of the cervical spine was performed without intravenous contrast. Multiplanar CT image reconstructions were also generated.  COMPARISON:  None.  FINDINGS: Straightening of the normal cervical lordosis. There is calcified pannus at the transverse ligament of the atlas and severe degenerative changes affecting the dens. Cystic changes are present, greater on the left than right, with posterior erosions. There is some cranial settling with remodeling of the clivus which now articulates with the degenerated odontoid and anterior arch of C1.  Intracranial atherosclerosis is noted. Accessory ossicles are present bilaterally dorsal to the occipital condyles. Severe atlanto occipital degenerative disease. There is no cervical spine fracture or dislocation. Multilevel degenerative disc and facet disease. Degenerative disc disease is most severe from C4-C5 through C6-C7. Disc osteophyte complexes and mild anterolisthesis is present. The anterolisthesis appears facet mediated and degenerative. Carotid atherosclerosis is incidentally noted. Thyroid goiter is present. Dilated proximal thoracic esophagus is present with fluid/fluid level.  IMPRESSION: 1. No acute osseous abnormality in the cervical spine. 2. Moderate to severe degenerative disease involving discs and facets. 3. Severe degenerative changes at the craniocervical junction and atlantoaxial junction. Calcification of the transverse ligament of the atlas. These findings can be associated with inflammatory arthropathy, commonly gout, CPPD arthropathy and rheumatoid arthritis. Favor CPPD arthropathy. 4. Dilated thoracic esophagus, partially visualized.   Electronically Signed   By: Dereck Ligas M.D.   On: 08/12/2013 19:49    EKG Interpretation    Date/Time:  Tuesday August 12 2013 18:42:57 EST Ventricular Rate:  55 PR Interval:  204 QRS Duration: 84 QT Interval:  452 QTC Calculation: 432 R Axis:   -19 Text Interpretation:  Sinus bradycardia with 1st degree AV block Borderline left axis deviation Reconfirmed by Deborah Dondero  MD, Mikah Poss ([id number]) on 08/12/2013 9:17:50 PM            MDM   1. Syncope   2. Bradycardia   3. Hematoma of right parietal scalp    Patient is well appearing, but does have reproducible lightheadedness with orthostasis. Her story was concerning with acute syncope while standing. She does have a new type 1 AV block with sinus bradycardia. Old records indicate no prior bradycardia. Is currently on two rate controlling meds for HTN (diltiazem and metoprolol). Only  new med is levaquin, but there is no signs of prolonged QTC (450 here). However, with her needing more fluid and being newly bradycardic with syncope, I feel she needs to be admitted for tele and IV fluids.    Ephraim Hamburger, MD 08/13/13 0001

## 2013-08-12 NOTE — ED Notes (Addendum)
Per ems: The patient states that she felt dizzy and woozy. The patient fell backwards and hit her head on the tub. Ems states that the patient is positive orthostatis. Patient denies any LOC

## 2013-08-12 NOTE — H&P (Addendum)
[facility] History and Physical  Ellen Marshall DJM:[id number] DOB: 1925/12/09 DOA: 08/12/2013  Referring physician: ER physician. PCP: Myrtis Ser, MD Dr. Inda Merlin.  Chief Complaint: Loss of consciousness.  HPI: Ellen Marshall is a 80 y.o. female with history of hypertension, hyperlipidemia, chronic diarrhea, CAD(but has not had any stents or CABG) had a brief episode of loss of consciousness at her living facility last evening. Patient was in the bathroom when she, on a standing position, suddenly felt dizzy and fell and hit her head. She thinks he may have lost consciousness. When she had regained her consciousness she called for help. EMS was called. As per the report from the ER physician EMS found her to be orthostatic. In the ER patient was feeling dizzy on standing but was not orthostatic. She was given normal saline bolus of 500 cc. Labs revealed mildly elevated creatinine. CT head and neck was unremarkable for anything acute. EKG shows sinus bradycardia. Patient 5 days ago has been having fever chills and cough and chest x-ray was showing pneumonia and was placed on Levaquin and patient had taken 5 days course already of antibiotics. Patient otherwise denies any chest pain palpitations focal deficit visual symptoms. Patient did not have any incontinence of urine or any tongue bite.   Review of Systems: As presented in the history of presenting illness, rest negative.  Past Medical History  Diagnosis Date  . Coronary artery disease   . Hypertension   . GERD (gastroesophageal reflux disease)   . Shortness of breath     laying down  . Stroke    Past Surgical History  Procedure Laterality Date  . Colonscopy    . Esophagogastroduodenoscopy    . Stretching of egd    . Abdominal hysterectomy    . Esophagogastroduodenoscopy  10/10/2011    Procedure: ESOPHAGOGASTRODUODENOSCOPY (EGD);  Surgeon: Winfield Cunas., MD;  Location: [facility] ENDOSCOPY;  Service: Endoscopy;  Laterality: N/A;   with control of bleeding  . Esophagogastroduodenoscopy  10/12/2011    Procedure: ESOPHAGOGASTRODUODENOSCOPY (EGD);  Surgeon: Winfield Cunas., MD;  Location: [facility] ENDOSCOPY;  Service: Endoscopy;  Laterality: N/A;  . Flexible sigmoidoscopy  10/12/2011    Procedure: FLEXIBLE SIGMOIDOSCOPY;  Surgeon: Winfield Cunas., MD;  Location: [facility] ENDOSCOPY;  Service: Endoscopy;  Laterality: N/A;   Social History:  reports that she has never smoked. She has never used smokeless tobacco. She reports that she does not drink alcohol or use illicit drugs. Where does patient live independent living facility. Can patient participate in ADLs? Yes.  Allergies  Allergen Reactions  . Amoxicillin Diarrhea  . Sulfa Antibiotics Itching    Family History: History reviewed. No pertinent family history.    Prior to Admission medications   Medication Sig Start Date End Date Taking? Authorizing Provider  Acetaminophen 650 MG TABS Take 1,300 mg by mouth 3 (three) times daily.   Yes Historical Provider, MD  allopurinol (ZYLOPRIM) 100 MG tablet Take 100 mg by mouth at bedtime.   Yes Historical Provider, MD  ALPRAZolam (XANAX) 0.25 MG tablet Take 0.25 mg by mouth 2 (two) times daily. As needed for anxiety.   Yes Historical Provider, MD  Calcium Carbonate-Vitamin D (CALCIUM + D) 600-200 MG-UNIT TABS Take 1 tablet by mouth 2 (two) times daily.   Yes Historical Provider, MD  clopidogrel (PLAVIX) 75 MG tablet Take 75 mg by mouth daily with breakfast.   Yes Historical Provider, MD  diltiazem (DILACOR XR) 120 MG 24 hr capsule Take 120 mg by  mouth daily.   Yes Historical Provider, MD  diphenoxylate-atropine (LOMOTIL) 2.5-0.025 MG per tablet Take 2 tablets by mouth 4 (four) times daily as needed for diarrhea or loose stools.    Yes Historical Provider, MD  escitalopram (LEXAPRO) 20 MG tablet Take 20 mg by mouth every morning.   Yes Historical Provider, MD  ferrous sulfate 325 (65 FE) MG tablet Take 325 mg by mouth 2 (two) times  daily with a meal.   Yes Historical Provider, MD  fexofenadine (ALLEGRA) 180 MG tablet Take 180 mg by mouth daily.   Yes Historical Provider, MD  isosorbide mononitrate (IMDUR) 60 MG 24 hr tablet Take 60 mg by mouth daily.   Yes Historical Provider, MD  levofloxacin (LEVAQUIN) 500 MG tablet Take 500 mg by mouth daily. For 10 days. 08/06/13  Yes Historical Provider, MD  Lutein 20 MG TABS Take 1 tablet by mouth every morning.   Yes Historical Provider, MD  metoprolol tartrate (LOPRESSOR) 25 MG tablet Take 12.5 mg by mouth 2 (two) times daily.   Yes Historical Provider, MD  Multiple Vitamins-Minerals (CENTRUM SILVER PO) Take 1 tablet by mouth daily at 12 noon.   Yes Historical Provider, MD  nitroGLYCERIN (NITROSTAT) 0.4 MG SL tablet Place 0.4 mg under the tongue every 5 (five) minutes as needed. As needed for chest pain.   Yes Historical Provider, MD  pantoprazole (PROTONIX) 40 MG tablet Take 40 mg by mouth daily.   Yes Historical Provider, MD  saccharomyces boulardii (FLORASTOR) 250 MG capsule Take 250 mg by mouth 2 (two) times daily.   Yes Historical Provider, MD  spironolactone (ALDACTONE) 25 MG tablet Take 25 mg by mouth. On Monday, Wednesday and Friday.   Yes Historical Provider, MD  timolol (TIMOPTIC-XR) 0.5 % ophthalmic gel-forming Place 1 drop into both eyes daily.   Yes Historical Provider, MD  travoprost, benzalkonium, (TRAVATAN) 0.004 % ophthalmic solution Place 1 drop into both eyes at bedtime.   Yes Historical Provider, MD  traZODone (DESYREL) 50 MG tablet Take 150 mg by mouth at bedtime.   Yes Historical Provider, MD  pantoprazole (PROTONIX) 40 MG tablet Take 1 tablet (40 mg total) by mouth 2 (two) times daily before a meal. 10/13/11 10/12/12  Jonetta Osgood, MD    Physical Exam: Filed Vitals:   08/12/13 1845 08/12/13 1846 08/12/13 2018 08/12/13 2212  BP: 133/58 128/60 135/43 153/62  Pulse: 54 55 54 63  Temp:   97.4 F (36.3 C) 97.5 F (36.4 C)  TempSrc:   Oral Oral  Resp:   20  18  Height:    5\' 3"  (1.6 m)  Weight:    65.5 kg (144 lb 6.4 oz)  SpO2:   100% 100%     General: Well-developed and nourished.   Eyes:  anicteric no pallor.   ENT: Right-sided parietal hematoma small. No discharge from the ears eyes nose mouth.  Neck: No mass felt.   Cardiovascular:  S1-S2 heard.   Respiratory:  no rhonchi or crepitations.   Abdomen: Soft nontender bowel sounds present. No guarding or rigidity.   Skin: No rash.   Musculoskeletal:  no edema.   Psychiatric:  appears normal.   Neurologic:  alert awake oriented to time place and person. Moves all extremities 5 x 5. No facial respiratory. Tongue is midline.   Labs on Admission:  Basic Metabolic Panel:  Recent Labs Lab 08/12/13 2007  NA 135*  K 4.5  CL 101  CO2 22  GLUCOSE 116*  BUN  21  CREATININE 1.15*  CALCIUM 8.4   Liver Function Tests: No results found for this basename: AST, ALT, ALKPHOS, BILITOT, PROT, ALBUMIN,  in the last 168 hours No results found for this basename: LIPASE, AMYLASE,  in the last 168 hours No results found for this basename: AMMONIA,  in the last 168 hours CBC:  Recent Labs Lab 08/12/13 2007  WBC 12.5*  HGB 12.6  HCT 38.0  MCV 97.2  PLT 387   Cardiac Enzymes: No results found for this basename: CKTOTAL, CKMB, CKMBINDEX, TROPONINI,  in the last 168 hours  BNP (last 3 results) No results found for this basename: PROBNP,  in the last 8760 hours CBG: No results found for this basename: GLUCAP,  in the last 168 hours  Radiological Exams on Admission: Dg Chest 2 View  08/12/2013   CLINICAL DATA:  Fall.  Syncope.  Hypertension.  EXAM: CHEST  2 VIEW  COMPARISON:  DG CHEST 2 VIEW dated 08/06/2013; CT HEAD W/O CM dated 08/12/2013; DG CHEST 2V dated 07/29/2012  FINDINGS: Cardiopericardial silhouette is within normal limits for projection. Aortic arch atherosclerosis. Lung volumes are low with bilateral basilar atelectasis. A abdominal aortic atherosclerosis is incidentally  noted. Monitoring leads project over the chest. No displaced rib fractures or pneumothorax.  IMPRESSION: Low volume chest with basilar atelectasis.  No acute abnormality.   Electronically Signed   By: Dereck Ligas M.D.   On: 08/12/2013 19:50   Ct Head Wo Contrast  08/12/2013   CLINICAL DATA:  History of fall with injury to the right side of the head.  EXAM: CT HEAD WITHOUT CONTRAST  TECHNIQUE: Contiguous axial images were obtained from the base of the skull through the vertex without intravenous contrast.  COMPARISON:  No priors.  FINDINGS: Well-defined focus of low attenuation in the right thalamus compatible with an old lacunar infarction. No acute displaced skull fractures are identified. No acute intracranial abnormality. Specifically, no evidence of acute post-traumatic intracranial hemorrhage, no definite regions of acute/subacute cerebral ischemia, no focal mass, mass effect, hydrocephalus or abnormal intra or extra-axial fluid collections. The visualized paranasal sinuses and mastoids are well pneumatized.  IMPRESSION: 1. No acute displaced skull fractures or acute intracranial abnormalities. 2. Old lacunar infarct in the right thalamus incidentally noted.   Electronically Signed   By: Vinnie Langton M.D.   On: 08/12/2013 18:36   Ct Cervical Spine Wo Contrast  08/12/2013   CLINICAL DATA:  Fall.  Posterior neck pain.  EXAM: CT CERVICAL SPINE WITHOUT CONTRAST  TECHNIQUE: Multidetector CT imaging of the cervical spine was performed without intravenous contrast. Multiplanar CT image reconstructions were also generated.  COMPARISON:  None.  FINDINGS: Straightening of the normal cervical lordosis. There is calcified pannus at the transverse ligament of the atlas and severe degenerative changes affecting the dens. Cystic changes are present, greater on the left than right, with posterior erosions. There is some cranial settling with remodeling of the clivus which now articulates with the degenerated  odontoid and anterior arch of C1. Intracranial atherosclerosis is noted. Accessory ossicles are present bilaterally dorsal to the occipital condyles. Severe atlanto occipital degenerative disease. There is no cervical spine fracture or dislocation. Multilevel degenerative disc and facet disease. Degenerative disc disease is most severe from C4-C5 through C6-C7. Disc osteophyte complexes and mild anterolisthesis is present. The anterolisthesis appears facet mediated and degenerative. Carotid atherosclerosis is incidentally noted. Thyroid goiter is present. Dilated proximal thoracic esophagus is present with fluid/fluid level.  IMPRESSION: 1. No acute osseous  abnormality in the cervical spine. 2. Moderate to severe degenerative disease involving discs and facets. 3. Severe degenerative changes at the craniocervical junction and atlantoaxial junction. Calcification of the transverse ligament of the atlas. These findings can be associated with inflammatory arthropathy, commonly gout, CPPD arthropathy and rheumatoid arthritis. Favor CPPD arthropathy. 4. Dilated thoracic esophagus, partially visualized.   Electronically Signed   By: Dereck Ligas M.D.   On: 08/12/2013 19:49    EKG: Independently reviewed. sinus bradycardia with QTC of 432.   Assessment/Plan Active Problems:   Syncope   Bradycardia   Hematoma of right parietal scalp   HTN (hypertension)   Acute renal failure   1.  Syncope - at this time patient's EKG shows sinus bradycardia for which I am going to hold patient's beta blocker and Cardizem. Since patient was initially orthostatic and creatinine is mildly elevated I will hold spironolactone and gently hydrate. Recheck orthostatics in a.m. Check 2-D echo. Since patient has sinus bradycardia check TSH. Closely follow in telemetry for any arrhythmias. 2. Sinus bradycardia  - see #1.  3. Acute renal failure - patient's creatinine is mildly elevated from baseline. Patient's urine is unremarkable.  For now we are gently hydrating and holding off spironolactone. 4. Hypertension - since patient's beta blockers and Cardizem an spironolactone is on hold I have placed patient on when necessary IV hydralazine for systolic blood pressure more than 160. 5. Chronic diarrhea - patient that he has recently increased. Which patient states is usual for her. Closely follow creatinine. Since patient on antibiotics we will also check C. Difficile. 6. Recently treated for pneumonia - family was consulted Levaquin was causing her syncope. I have changed patient's antibiotics to ceftriaxone and Zithromax for now for 2 more days. 7. History of gout  - continue allopurinol. 8. History of CAD - denies any chest pain. Troponin negative. Check 2-D echo.  I have reviewed patient's old charts and labs.  Code Status: full code.   Family Communication:  patient's son at the bedside.   Disposition Plan:  admit for observation under Dr. Mertha Finders service.    KAKRAKANDY,ARSHAD N. [facility] Pager [phone]  If 7PM-7AM, please contact night-coverage [email or web address] Password [facility] 08/12/2013, 10:33 PM

## 2013-08-13 DIAGNOSIS — R079 Chest pain, unspecified: Secondary | ICD-10-CM | POA: Diagnosis not present

## 2013-08-13 DIAGNOSIS — R55 Syncope and collapse: Secondary | ICD-10-CM | POA: Diagnosis not present

## 2013-08-13 DIAGNOSIS — I379 Nonrheumatic pulmonary valve disorder, unspecified: Secondary | ICD-10-CM

## 2013-08-13 DIAGNOSIS — J189 Pneumonia, unspecified organism: Secondary | ICD-10-CM | POA: Diagnosis present

## 2013-08-13 LAB — COMPREHENSIVE METABOLIC PANEL
ALBUMIN: 3 g/dL — AB (ref 3.5–5.2)
ALT: 7 U/L (ref 0–35)
AST: 13 U/L (ref 0–37)
Alkaline Phosphatase: 52 U/L (ref 39–117)
BUN: 18 mg/dL (ref 6–23)
CHLORIDE: 101 meq/L (ref 96–112)
CO2: 24 mEq/L (ref 19–32)
Calcium: 8.4 mg/dL (ref 8.4–10.5)
Creatinine, Ser: 1.06 mg/dL (ref 0.50–1.10)
GFR calc Af Amer: 54 mL/min — ABNORMAL LOW (ref >90)
GFR calc non Af Amer: 47 mL/min — ABNORMAL LOW (ref >90)
GLUCOSE: 87 mg/dL (ref 70–99)
POTASSIUM: 3.7 meq/L (ref 3.7–5.3)
Sodium: 136 mEq/L — ABNORMAL LOW (ref 137–147)
Total Protein: 5.9 g/dL — ABNORMAL LOW (ref 6.0–8.3)

## 2013-08-13 LAB — CBC
HCT: 34.9 % — ABNORMAL LOW (ref 36.0–46.0)
Hemoglobin: 11.4 g/dL — ABNORMAL LOW (ref 12.0–15.0)
MCH: 31.9 pg (ref 26.0–34.0)
MCHC: 32.7 g/dL (ref 30.0–36.0)
MCV: 97.8 fL (ref 78.0–100.0)
PLATELETS: 376 10*3/uL (ref 150–400)
RBC: 3.57 MIL/uL — ABNORMAL LOW (ref 3.87–5.11)
RDW: 15 % (ref 11.5–15.5)
WBC: 10.3 10*3/uL (ref 4.0–10.5)

## 2013-08-13 LAB — INFLUENZA PANEL BY PCR (TYPE A & B)
H1N1 flu by pcr: NOT DETECTED
Influenza A By PCR: NEGATIVE
Influenza B By PCR: NEGATIVE

## 2013-08-13 LAB — TROPONIN I: Troponin I: <0.3 ng/mL (ref <0.30)

## 2013-08-13 LAB — TSH: TSH: 3.341 u[IU]/mL (ref 0.350–4.500)

## 2013-08-13 LAB — MRSA PCR SCREENING: MRSA by PCR: NEGATIVE

## 2013-08-13 MED ORDER — LATANOPROST 0.005 % OP SOLN
1.0000 [drp] | Freq: Every day | OPHTHALMIC | Status: DC
  Administered 2013-08-13: 1 [drp] via OPHTHALMIC
  Filled 2013-08-13: qty 2.5

## 2013-08-13 MED ORDER — METOPROLOL TARTRATE 12.5 MG HALF TABLET
12.5000 mg | ORAL_TABLET | Freq: Two times a day (BID) | ORAL | Status: DC
  Administered 2013-08-13 – 2013-08-14 (×3): 12.5 mg via ORAL
  Filled 2013-08-13 (×4): qty 1

## 2013-08-13 MED ORDER — HYDRALAZINE HCL 20 MG/ML IJ SOLN
10.0000 mg | INTRAMUSCULAR | Status: DC | PRN
  Administered 2013-08-13: 10 mg via INTRAVENOUS
  Filled 2013-08-13: qty 1

## 2013-08-13 NOTE — Progress Notes (Signed)
Subjective: No cough or diarrhea. Has had some recent shortness of breath with exertion and atypical chest pain  Objective: Vital signs in last 24 hours: Temp:  [97.4 F (36.3 C)-97.5 F (36.4 C)] 97.5 F (36.4 C) (01/27 2212) Pulse Rate:  [51-63] 63 (01/27 2212) Resp:  [18-20] 18 (01/27 2212) BP: (118-153)/(43-62) 153/62 mmHg (01/27 2212) SpO2:  [96 %-100 %] 100 % (01/27 2212) Weight:  [65.5 kg (144 lb 6.4 oz)] 65.5 kg (144 lb 6.4 oz) (01/27 2212) Weight change:     Intake/Output from previous day: 01/27 0701 - 01/28 0700 In: 250 [IV Piggyback:250] Out: 700 [Urine:700] Intake/Output this shift: Total I/O In: 250 [IV Piggyback:250] Out: 700 [Urine:700]  General appearance: alert and cooperative Resp: clear to auscultation bilaterally Cardio: regular rate and rhythm, S1, S2 normal, no murmur, click, rub or gallop GI: soft, non-tender; bowel sounds normal; no masses,  no organomegaly Extremities: extremities normal, atraumatic, no cyanosis or edema  Lab Results:  Recent Labs  08/12/13 2007 08/13/13 0410  WBC 12.5* 10.3  HGB 12.6 11.4*  HCT 38.0 34.9*  PLT 387 376   BMET  Recent Labs  08/12/13 2007 08/13/13 0410  NA 135* 136*  K 4.5 3.7  CL 101 101  CO2 22 24  GLUCOSE 116* 87  BUN 21 18  CREATININE 1.15* 1.06  CALCIUM 8.4 8.4    Studies/Results: Dg Chest 2 View  08/12/2013   CLINICAL DATA:  Fall.  Syncope.  Hypertension.  EXAM: CHEST  2 VIEW  COMPARISON:  DG CHEST 2 VIEW dated 08/06/2013; CT HEAD W/O CM dated 08/12/2013; DG CHEST 2V dated 07/29/2012  FINDINGS: Cardiopericardial silhouette is within normal limits for projection. Aortic arch atherosclerosis. Lung volumes are low with bilateral basilar atelectasis. A abdominal aortic atherosclerosis is incidentally noted. Monitoring leads project over the chest. No displaced rib fractures or pneumothorax.  IMPRESSION: Low volume chest with basilar atelectasis.  No acute abnormality.   Electronically Signed   By:  Dereck Ligas M.D.   On: 08/12/2013 19:50   Ct Head Wo Contrast  08/12/2013   CLINICAL DATA:  History of fall with injury to the right side of the head.  EXAM: CT HEAD WITHOUT CONTRAST  TECHNIQUE: Contiguous axial images were obtained from the base of the skull through the vertex without intravenous contrast.  COMPARISON:  No priors.  FINDINGS: Well-defined focus of low attenuation in the right thalamus compatible with an old lacunar infarction. No acute displaced skull fractures are identified. No acute intracranial abnormality. Specifically, no evidence of acute post-traumatic intracranial hemorrhage, no definite regions of acute/subacute cerebral ischemia, no focal mass, mass effect, hydrocephalus or abnormal intra or extra-axial fluid collections. The visualized paranasal sinuses and mastoids are well pneumatized.  IMPRESSION: 1. No acute displaced skull fractures or acute intracranial abnormalities. 2. Old lacunar infarct in the right thalamus incidentally noted.   Electronically Signed   By: Vinnie Langton M.D.   On: 08/12/2013 18:36   Ct Cervical Spine Wo Contrast  08/12/2013   CLINICAL DATA:  Fall.  Posterior neck pain.  EXAM: CT CERVICAL SPINE WITHOUT CONTRAST  TECHNIQUE: Multidetector CT imaging of the cervical spine was performed without intravenous contrast. Multiplanar CT image reconstructions were also generated.  COMPARISON:  None.  FINDINGS: Straightening of the normal cervical lordosis. There is calcified pannus at the transverse ligament of the atlas and severe degenerative changes affecting the dens. Cystic changes are present, greater on the left than right, with posterior erosions. There is some cranial  settling with remodeling of the clivus which now articulates with the degenerated odontoid and anterior arch of C1. Intracranial atherosclerosis is noted. Accessory ossicles are present bilaterally dorsal to the occipital condyles. Severe atlanto occipital degenerative disease. There is  no cervical spine fracture or dislocation. Multilevel degenerative disc and facet disease. Degenerative disc disease is most severe from C4-C5 through C6-C7. Disc osteophyte complexes and mild anterolisthesis is present. The anterolisthesis appears facet mediated and degenerative. Carotid atherosclerosis is incidentally noted. Thyroid goiter is present. Dilated proximal thoracic esophagus is present with fluid/fluid level.  IMPRESSION: 1. No acute osseous abnormality in the cervical spine. 2. Moderate to severe degenerative disease involving discs and facets. 3. Severe degenerative changes at the craniocervical junction and atlantoaxial junction. Calcification of the transverse ligament of the atlas. These findings can be associated with inflammatory arthropathy, commonly gout, CPPD arthropathy and rheumatoid arthritis. Favor CPPD arthropathy. 4. Dilated thoracic esophagus, partially visualized.   Electronically Signed   By: Dereck Ligas M.D.   On: 08/12/2013 19:49    Medications: I have reviewed the patient's current medications.  Assessment/Plan: Principal Problem:   Syncope suspect this is related to mild volume depletion. Continue IV fluids. Restart metoprolol and monitor on telemetry for significant bradycardia. Has had some recent shortness of breath and chest pain will check 2-D echo. Active Problems:   Bradycardia follow on telemetry   Hematoma of right parietal scalp   HTN (hypertension) under reasonable control holding diltiazem and spironolactone   CAP (community acquired pneumonia) resolved, stop antibiotics Chronic diarrhea, not having diarrhea in the hospital Disposition PT to see today, possible discharge tomorrow   LOS: 1 day   [place] 08/13/2013, 6:49 AM

## 2013-08-13 NOTE — Progress Notes (Signed)
ANTIBIOTIC CONSULT NOTE - INITIAL  Pharmacy Consult for ceftriaxone Indication: pneumonia  Allergies  Allergen Reactions  . Amoxicillin Diarrhea  . Sulfa Antibiotics Itching    Patient Measurements: Height: 5\' 3"  (160 cm) Weight: 144 lb 6.4 oz (65.5 kg) IBW/kg (Calculated) : 52.4 Adjusted Body Weight:   Vital Signs: Temp: 97.5 F (36.4 C) (01/27 2212) Temp src: Oral (01/27 2212) BP: 153/62 mmHg (01/27 2212) Pulse Rate: 63 (01/27 2212) Intake/Output from previous day: 01/27 0701 - 01/28 0700 In: 250 [IV Piggyback:250] Out: 700 [Urine:700] Intake/Output from this shift: Total I/O In: 250 [IV Piggyback:250] Out: 700 [Urine:700]  Labs:  Recent Labs  08/12/13 2007  WBC 12.5*  HGB 12.6  PLT 387  CREATININE 1.15*   Estimated Creatinine Clearance: 32.5 ml/min (by C-G formula based on Cr of 1.15). No results found for this basename: VANCOTROUGH, Corlis Leak, VANCORANDOM, [place], GENTPEAK, GENTRANDOM, TOBRATROUGH, TOBRAPEAK, TOBRARND, AMIKACINPEAK, AMIKACINTROU, AMIKACIN,  in the last 72 hours   Microbiology: Recent Results (from the past 720 hour(s))  MRSA PCR SCREENING     Status: None   Collection Time    08/13/13 12:06 AM      Result Value Range Status   MRSA by PCR NEGATIVE  NEGATIVE Final   Comment:            The GeneXpert MRSA Assay (FDA     approved for NASAL specimens     only), is one component of a     comprehensive MRSA colonization     surveillance program. It is not     intended to diagnose MRSA     infection nor to guide or     monitor treatment for     MRSA infections.    Medical History: Past Medical History  Diagnosis Date  . Coronary artery disease   . Hypertension   . GERD (gastroesophageal reflux disease)   . Shortness of breath     laying down  . Stroke     Medications:  Anti-infectives   Start     Dose/Rate Route Frequency Ordered Stop   08/12/13 2300  cefTRIAXone (ROCEPHIN) 1 g in dextrose 5 % 50 mL IVPB     1 g 100  mL/hr over 30 Minutes Intravenous Daily at bedtime 08/12/13 2246     08/12/13 2245  azithromycin (ZITHROMAX) 500 mg in dextrose 5 % 250 mL IVPB     500 mg 250 mL/hr over 60 Minutes Intravenous Daily at bedtime 08/12/13 2233       Assessment: Patient with PNA.  First dose of antibiotics already given.  Goal of Therapy:  Ceftriaxone based on manufacturer dosing   Plan: Ceftriaxone 1gm iv q24hr   Nani Skillern [place] 08/13/2013,5:09 AM

## 2013-08-13 NOTE — Progress Notes (Signed)
UR completed 

## 2013-08-13 NOTE — Evaluation (Signed)
Physical Therapy Evaluation Patient Details Name: Ellen Marshall MRN: [id number] DOB: 04/10/1926 Today's Date: 08/13/2013 Time: 3151-7616 PT Time Calculation (min): 23 min  PT Assessment / Plan / Recommendation History of Present Illness  80 yo female admitted with syncopal episode, fall, weakness, R parietal scalp hematoma  Clinical Impression  On eval, pt required supervision level assist for safety during mobility-able to ambulate ~100 feet with walker. No LOB. Pt did not c/o of dizziness/lightheadedness but did state she felt weak. Plan is for pt to go home with her son where she will have 24/7 supervision. Do not anticipate any follow PT needs at this time.     PT Assessment  Patient needs continued PT services    Follow Up Recommendations  Supervision/Assistance - 24 hour    Does the patient have the potential to tolerate intense rehabilitation      Barriers to Discharge        Equipment Recommendations  None recommended by PT    Recommendations for Other Services     Frequency Min 3X/week    Precautions / Restrictions Precautions Precautions: Fall Restrictions Weight Bearing Restrictions: No   Pertinent Vitals/Pain L LE 7/10 "cramp" 141/56 supine 145/53 sitting 119/58 standing 161/57 after walk (seated)      Mobility  Bed Mobility Overal bed mobility: Modified Independent Transfers Overall transfer level: Needs assistance Transfers: Sit to/from Stand Sit to Stand: Supervision General transfer comment: for safety. VCs hand placement Ambulation/Gait Ambulation/Gait assistance: Supervision Ambulation Distance (Feet): 100 Feet Assistive device: Rolling walker (2 wheeled) Gait Pattern/deviations: Decreased stride length General Gait Details: slow gait speed. gait mildly antalgic initially but improved with distance (pt states she has had "trouble" with L leg-chronic).  no dizziness/lightheadedness reported by pt but she did state she felt "weak".     Exercises      PT Diagnosis: Difficulty walking;Generalized weakness  PT Problem List: Decreased strength;Decreased activity tolerance;Decreased mobility;Pain PT Treatment Interventions: Gait training;Functional mobility training;Therapeutic activities;Therapeutic exercise;Patient/family education     PT Goals(Current goals can be found in the care plan section) Acute Rehab PT Goals Patient Stated Goal: feel better PT Goal Formulation: With patient/family Time For Goal Achievement: 08/27/13 Potential to Achieve Goals: Good  Visit Information  Last PT Received On: 08/13/13 Assistance Needed: +1 History of Present Illness: 80 yo female admitted with syncopal episode, fall, weakness, R parietal scalp hematoma       Prior Functioning  Home Living Living Arrangements: Alone Type of Home: Independent living facility ([facility]) Home Access: Level entry Home Layout: One level Home Equipment: Walker - 4 wheels;Shower seat - built in Prior Function Level of Independence: Needs assistance Gait / Transfers Assistance Needed: normally ambulates with rollator Comments: last few weeks pt hasn't been able to ambulate to dining room-so meals brought to her Communication Communication: No difficulties    Cognition  Cognition Arousal/Alertness: Awake/alert Behavior During Therapy: WFL for tasks assessed/performed Overall Cognitive Status: Within Functional Limits for tasks assessed    Extremity/Trunk Assessment Upper Extremity Assessment Upper Extremity Assessment: Overall WFL for tasks assessed Lower Extremity Assessment Lower Extremity Assessment: Generalized weakness Cervical / Trunk Assessment Cervical / Trunk Assessment: Normal   Balance Balance Overall balance assessment: History of Falls;Needs assistance Sitting-balance support: No upper extremity supported;Feet supported Sitting balance-Leahy Scale: Normal Standing balance support: Bilateral upper extremity supported;During  functional activity Standing balance-Leahy Scale: Fair  End of Session PT - End of Session Equipment Utilized During Treatment: Gait belt Activity Tolerance: Patient tolerated treatment well Patient left:  in bed;with call bell/phone within reach;with family/visitor present  GP Functional Assessment Tool Used: clinical judgement Functional Limitation: Mobility: Walking and moving around Mobility: Walking and Moving Around Current Status [phone]): At least 1 percent but less than 20 percent impaired, limited or restricted Mobility: Walking and Moving Around Goal Status [phone]): 0 percent impaired, limited or restricted   Weston Anna, MPT Pager: [phone]

## 2013-08-13 NOTE — Progress Notes (Signed)
Echocardiogram 2D Echocardiogram has been performed.  Shaterica Mcclatchy 08/13/2013, 11:51 AM

## 2013-08-14 DIAGNOSIS — R55 Syncope and collapse: Secondary | ICD-10-CM | POA: Diagnosis not present

## 2013-08-14 DIAGNOSIS — R079 Chest pain, unspecified: Secondary | ICD-10-CM | POA: Diagnosis not present

## 2013-08-14 LAB — BASIC METABOLIC PANEL
BUN: 16 mg/dL (ref 6–23)
CO2: 22 mEq/L (ref 19–32)
CREATININE: 0.94 mg/dL (ref 0.50–1.10)
Calcium: 9.3 mg/dL (ref 8.4–10.5)
Chloride: 101 mEq/L (ref 96–112)
GFR calc non Af Amer: 54 mL/min — ABNORMAL LOW (ref >90)
GFR, EST AFRICAN AMERICAN: 62 mL/min — AB (ref >90)
Glucose, Bld: 93 mg/dL (ref 70–99)
POTASSIUM: 3.8 meq/L (ref 3.7–5.3)
Sodium: 137 mEq/L (ref 137–147)

## 2013-08-14 MED ORDER — VITAMINS A & D EX OINT
TOPICAL_OINTMENT | CUTANEOUS | Status: AC
  Administered 2013-08-14
  Filled 2013-08-14: qty 5

## 2013-08-14 MED ORDER — DILTIAZEM HCL ER COATED BEADS 120 MG PO CP24
120.0000 mg | ORAL_CAPSULE | Freq: Every day | ORAL | Status: DC
  Administered 2013-08-14: 120 mg via ORAL
  Filled 2013-08-14: qty 1

## 2013-08-14 NOTE — Discharge Instructions (Signed)
Drink regular fluids. Stop taking spironolactone for now.

## 2013-08-14 NOTE — Progress Notes (Signed)
Physical Therapy Treatment Patient Details Name: Ellen Marshall MRN: [id number] DOB: 1925/09/06 Today's Date: 08/14/2013 Time: 1308-6578 PT Time Calculation (min): 13 min  PT Assessment / Plan / Recommendation  History of Present Illness 80 yo female admitted with syncopal episode, fall, weakness, R parietal scalp hematoma   PT Comments   Progressing well with mobility. No c/o dizziness during session. No LOB. Supervision level only because of recent history of syncopal episode,fall. Plan is for d/c later today. No follow up PT needs however I still recommend 24 hour supervision initially.   Follow Up Recommendations  Supervision/Assistance - 24 hour (initially)     Does the patient have the potential to tolerate intense rehabilitation     Barriers to Discharge        Equipment Recommendations  None recommended by PT    Recommendations for Other Services    Frequency Min 3X/week   Progress towards PT Goals Progress towards PT goals: Progressing toward goals  Plan Current plan remains appropriate    Precautions / Restrictions Precautions Precautions: Fall Restrictions Weight Bearing Restrictions: No   Pertinent Vitals/Pain 9/10 R shoulder. Applied heat pack at end of session. Instructed pt to remove if there is any discomfort.     Mobility  Bed Mobility Overal bed mobility: Modified Independent Transfers Overall transfer level: Modified independent Ambulation/Gait Ambulation/Gait assistance: Supervision Ambulation Distance (Feet): 300 Feet Assistive device: Rolling walker (2 wheeled) Gait Pattern/deviations: Step-through pattern Gait velocity: supervison for safety reasons only due to recent hx of syncope. No c/o dizziness. No LOB    Exercises     PT Diagnosis:    PT Problem List:   PT Treatment Interventions:     PT Goals (current goals can now be found in the care plan section)    Visit Information  Last PT Received On: 08/14/13 Assistance Needed: +1 History  of Present Illness: 80 yo female admitted with syncopal episode, fall, weakness, R parietal scalp hematoma    Subjective Data      Cognition  Cognition Arousal/Alertness: Awake/alert Behavior During Therapy: WFL for tasks assessed/performed Overall Cognitive Status: Within Functional Limits for tasks assessed    Balance     End of Session PT - End of Session Equipment Utilized During Treatment: Gait belt Activity Tolerance: Patient tolerated treatment well Patient left: in bed;with call bell/phone within reach   GP Functional Assessment Tool Used: clinical judgement Functional Limitation: Mobility: Walking and moving around Mobility: Walking and Moving Around Current Status ([id number]): At least 1 percent but less than 20 percent impaired, limited or restricted Mobility: Walking and Moving Around Goal Status [phone]): 0 percent impaired, limited or restricted Mobility: Walking and Moving Around Discharge Status [phone]): At least 1 percent but less than 20 percent impaired, limited or restricted   Weston Anna, MPT Pager: [phone]

## 2013-08-14 NOTE — Progress Notes (Signed)
Discharge to home, pick up by son ,Abbe [place]. D/c instructions and follow up appointments done and was given to the son. PIV removed no s/s of infiltration or swelling noted.

## 2013-08-14 NOTE — Discharge Summary (Signed)
Physician Discharge Summary  Patient ID: Ellen Marshall MRN: [id number] DOB/AGE: 1925-11-23 80 y.o.  Admit date: 08/12/2013 Discharge date: 08/14/2013  Admission Diagnoses: Syncope Volume depletion Bradycardia Right scalp hematoma Hypertension Community acquired pneumonia  Discharge Diagnoses:  Principal Problem:   Syncope Active Problems:   Bradycardia   Hematoma of right parietal scalp   HTN (hypertension)   CAP (community acquired pneumonia) Coronary artery disease Gastroesophageal reflux disease   Discharged Condition: good  Hospital Course: The patient was admitted on January 27 after brief episode of loss of consciousness. She had briefly had syncope while standing in her bathroom and hit her head. She was found to be orthostatic by EMS. She had had recent pneumonia diagnosed 5 days prior be treated with Levaquin. She'll be supported some increase in her chronic diarrhea. Admission laboratory showed creatinine mildly elevated at 1.15 white count 12.5. Chest x-ray showed basilar atelectasis but no infiltrate. CT scan of the head showed no skull fracture or acute intracranial abnormalities, there was an old right thalamic lacunar infarct. CT scan of the C-spine showed no evidence of acute fracture. The patient was admitted and given IV fluids. She remained in normal sinus rhythm on telemetry with some PACs and PVCs. 2-D echocardiogram showed normal systolic function, ejection fraction 60-65%, normal wall motion, grade 1 diastolic dysfunction. The patient's creatinine dropped 0.94 with IV fluids. She was able to and does physical therapy who felt she was stable for transfers and ambulation he would need 24-hour assistance for couple days but no followup physical therapy. Spironolactone was held at discharge. Orthostatics were checked on the floor and were negative.  Consults: None  Significant Diagnostic Studies: labs: At discharge sodium 137 potassium 3.8 chloride 101 bicarbonate 22  BUN 16 creatinine 0.94, radiology: CXR: atelectasis bilaterally and CT scan: Head and neck as above and cardiac graphics: Echocardiogram: As above  Treatments: IV hydration, cardiac meds: metoprolol and diltiazem and therapies: PT  Discharge Exam: Blood pressure 149/56, pulse 86, temperature 98.2 F (36.8 C), temperature source Oral, resp. rate 18, height 5\' 3"  (1.6 m), weight 65.5 kg (144 lb 6.4 oz), SpO2 98.00%. General appearance: alert and cooperative Resp: clear to auscultation bilaterally Cardio: regular rate and rhythm, S1, S2 normal, no murmur, click, rub or gallop  Disposition: 03-Skilled Nursing Facility   Future Appointments Provider Department Dept Phone   09/22/2013 2:00 PM Sinclair Grooms, MD [facility] [phone]       Medication List    STOP taking these medications       levofloxacin 500 MG tablet  Commonly known as:  LEVAQUIN     spironolactone 25 MG tablet  Commonly known as:  ALDACTONE      TAKE these medications       Acetaminophen 650 MG Tabs  Take 1,300 mg by mouth 3 (three) times daily.     allopurinol 100 MG tablet  Commonly known as:  ZYLOPRIM  Take 100 mg by mouth at bedtime.     ALPRAZolam 0.25 MG tablet  Commonly known as:  XANAX  Take 0.25 mg by mouth 2 (two) times daily. As needed for anxiety.     Calcium Carbonate-Vitamin D 600-200 MG-UNIT Tabs  Take 1 tablet by mouth 2 (two) times daily.     CENTRUM SILVER PO  Take 1 tablet by mouth daily at 12 noon.     clopidogrel 75 MG tablet  Commonly known as:  PLAVIX  Take 75 mg by mouth daily with breakfast.  diltiazem 120 MG 24 hr capsule  Commonly known as:  DILACOR XR  Take 120 mg by mouth daily.     diphenoxylate-atropine 2.5-0.025 MG per tablet  Commonly known as:  LOMOTIL  Take 2 tablets by mouth 4 (four) times daily as needed for diarrhea or loose stools.     escitalopram 20 MG tablet  Commonly known as:  LEXAPRO  Take 20 mg by mouth every morning.      ferrous sulfate 325 (65 FE) MG tablet  Take 325 mg by mouth 2 (two) times daily with a meal.     fexofenadine 180 MG tablet  Commonly known as:  ALLEGRA  Take 180 mg by mouth daily.     isosorbide mononitrate 60 MG 24 hr tablet  Commonly known as:  IMDUR  Take 60 mg by mouth daily.     Lutein 20 MG Tabs  Take 1 tablet by mouth every morning.     metoprolol tartrate 25 MG tablet  Commonly known as:  LOPRESSOR  Take 12.5 mg by mouth 2 (two) times daily.     nitroGLYCERIN 0.4 MG SL tablet  Commonly known as:  NITROSTAT  Place 0.4 mg under the tongue every 5 (five) minutes as needed. As needed for chest pain.     pantoprazole 40 MG tablet  Commonly known as:  PROTONIX  Take 40 mg by mouth daily.     pantoprazole 40 MG tablet  Commonly known as:  PROTONIX  Take 1 tablet (40 mg total) by mouth 2 (two) times daily before a meal.     saccharomyces boulardii 250 MG capsule  Commonly known as:  FLORASTOR  Take 250 mg by mouth 2 (two) times daily.     timolol 0.5 % ophthalmic gel-forming  Commonly known as:  TIMOPTIC-XR  Place 1 drop into both eyes daily.     travoprost (benzalkonium) 0.004 % ophthalmic solution  Commonly known as:  TRAVATAN  Place 1 drop into both eyes at bedtime.     traZODone 50 MG tablet  Commonly known as:  DESYREL  Take 150 mg by mouth at bedtime.           Follow-up Information   Follow up with GATES,ROBERT NEVILL, MD In 1 week.   Specialty:  Internal Medicine   Contact information:   [address] [place] [address] [phone]       Signed: Irven Shelling 08/14/2013, 6:53 AM

## 2013-08-14 NOTE — Progress Notes (Signed)
Subjective: Still a little weak  Objective: Vital signs in last 24 hours: Temp:  [97.4 F (36.3 C)-98.2 F (36.8 C)] 98.2 F (36.8 C) (01/29 0539) Pulse Rate:  [72-86] 86 (01/29 0539) Resp:  [17-19] 18 (01/29 0539) BP: (119-167)/(48-80) 149/56 mmHg (01/29 0539) SpO2:  [93 %-98 %] 98 % (01/29 0539) Weight change:  Last BM Date: 08/12/13  Intake/Output from previous day: 01/28 0701 - 01/29 0700 In: 1710 [P.O.:1560; I.V.:150] Out: 3550 [Urine:3550] Intake/Output this shift: Total I/O In: 840 [P.O.:840] Out: 1500 [Urine:1500]  General appearance: alert and cooperative Resp: clear to auscultation bilaterally Cardio: regular rate and rhythm with extrasystoles, S1, S2 normal, no murmur, click, rub or gallop  Lab Results:  Recent Labs  08/12/13 2007 08/13/13 0410  WBC 12.5* 10.3  HGB 12.6 11.4*  HCT 38.0 34.9*  PLT 387 376   BMET  Recent Labs  08/13/13 0410 08/14/13 0400  NA 136* 137  K 3.7 3.8  CL 101 101  CO2 24 22  GLUCOSE 87 93  BUN 18 16  CREATININE 1.06 0.94  CALCIUM 8.4 9.3    Studies/Results: Dg Chest 2 View  08/12/2013   CLINICAL DATA:  Fall.  Syncope.  Hypertension.  EXAM: CHEST  2 VIEW  COMPARISON:  DG CHEST 2 VIEW dated 08/06/2013; CT HEAD W/O CM dated 08/12/2013; DG CHEST 2V dated 07/29/2012  FINDINGS: Cardiopericardial silhouette is within normal limits for projection. Aortic arch atherosclerosis. Lung volumes are low with bilateral basilar atelectasis. A abdominal aortic atherosclerosis is incidentally noted. Monitoring leads project over the chest. No displaced rib fractures or pneumothorax.  IMPRESSION: Low volume chest with basilar atelectasis.  No acute abnormality.   Electronically Signed   By: Dereck Ligas M.D.   On: 08/12/2013 19:50   Ct Head Wo Contrast  08/12/2013   CLINICAL DATA:  History of fall with injury to the right side of the head.  EXAM: CT HEAD WITHOUT CONTRAST  TECHNIQUE: Contiguous axial images were obtained from the base of the  skull through the vertex without intravenous contrast.  COMPARISON:  No priors.  FINDINGS: Well-defined focus of low attenuation in the right thalamus compatible with an old lacunar infarction. No acute displaced skull fractures are identified. No acute intracranial abnormality. Specifically, no evidence of acute post-traumatic intracranial hemorrhage, no definite regions of acute/subacute cerebral ischemia, no focal mass, mass effect, hydrocephalus or abnormal intra or extra-axial fluid collections. The visualized paranasal sinuses and mastoids are well pneumatized.  IMPRESSION: 1. No acute displaced skull fractures or acute intracranial abnormalities. 2. Old lacunar infarct in the right thalamus incidentally noted.   Electronically Signed   By: Vinnie Langton M.D.   On: 08/12/2013 18:36   Ct Cervical Spine Wo Contrast  08/12/2013   CLINICAL DATA:  Fall.  Posterior neck pain.  EXAM: CT CERVICAL SPINE WITHOUT CONTRAST  TECHNIQUE: Multidetector CT imaging of the cervical spine was performed without intravenous contrast. Multiplanar CT image reconstructions were also generated.  COMPARISON:  None.  FINDINGS: Straightening of the normal cervical lordosis. There is calcified pannus at the transverse ligament of the atlas and severe degenerative changes affecting the dens. Cystic changes are present, greater on the left than right, with posterior erosions. There is some cranial settling with remodeling of the clivus which now articulates with the degenerated odontoid and anterior arch of C1. Intracranial atherosclerosis is noted. Accessory ossicles are present bilaterally dorsal to the occipital condyles. Severe atlanto occipital degenerative disease. There is no cervical spine fracture or dislocation.  Multilevel degenerative disc and facet disease. Degenerative disc disease is most severe from C4-C5 through C6-C7. Disc osteophyte complexes and mild anterolisthesis is present. The anterolisthesis appears facet  mediated and degenerative. Carotid atherosclerosis is incidentally noted. Thyroid goiter is present. Dilated proximal thoracic esophagus is present with fluid/fluid level.  IMPRESSION: 1. No acute osseous abnormality in the cervical spine. 2. Moderate to severe degenerative disease involving discs and facets. 3. Severe degenerative changes at the craniocervical junction and atlantoaxial junction. Calcification of the transverse ligament of the atlas. These findings can be associated with inflammatory arthropathy, commonly gout, CPPD arthropathy and rheumatoid arthritis. Favor CPPD arthropathy. 4. Dilated thoracic esophagus, partially visualized.   Electronically Signed   By: Dereck Ligas M.D.   On: 08/12/2013 19:49    Medications: I have reviewed the patient's current medications.  Assessment/Plan: Principal Problem:  Syncope suspect this is related to mild volume depletion.  Echo unremarkable.  D/C IVFs.  Ambulate and probable discharge later today Active Problems:  Bradycardia resolved Hematoma of right parietal scalp  HTN (hypertension) restart diltiazem, CAP (community acquired pneumonia) resolved Chronic diarrhea, not having diarrhea in the hospital  Disposition probable discharge today    LOS: 2 days   [facility] 08/14/2013, 6:43 AM

## 2013-08-18 DIAGNOSIS — S0003XA Contusion of scalp, initial encounter: Secondary | ICD-10-CM | POA: Diagnosis not present

## 2013-08-18 DIAGNOSIS — J189 Pneumonia, unspecified organism: Secondary | ICD-10-CM | POA: Diagnosis not present

## 2013-08-18 DIAGNOSIS — I498 Other specified cardiac arrhythmias: Secondary | ICD-10-CM | POA: Diagnosis not present

## 2013-08-18 DIAGNOSIS — S1093XA Contusion of unspecified part of neck, initial encounter: Secondary | ICD-10-CM | POA: Diagnosis not present

## 2013-08-18 DIAGNOSIS — R55 Syncope and collapse: Secondary | ICD-10-CM | POA: Diagnosis not present

## 2013-09-22 ENCOUNTER — Ambulatory Visit (INDEPENDENT_AMBULATORY_CARE_PROVIDER_SITE_OTHER): Payer: Medicare Other | Admitting: Interventional Cardiology

## 2013-09-22 ENCOUNTER — Encounter: Payer: Self-pay | Admitting: Interventional Cardiology

## 2013-09-22 VITALS — BP 124/58 | HR 71 | Ht 63.0 in | Wt 141.8 lb

## 2013-09-22 DIAGNOSIS — I2510 Atherosclerotic heart disease of native coronary artery without angina pectoris: Secondary | ICD-10-CM

## 2013-09-22 DIAGNOSIS — I10 Essential (primary) hypertension: Secondary | ICD-10-CM | POA: Diagnosis not present

## 2013-09-22 DIAGNOSIS — R55 Syncope and collapse: Secondary | ICD-10-CM | POA: Diagnosis not present

## 2013-09-22 MED ORDER — ISOSORBIDE MONONITRATE ER 60 MG PO TB24: 90.0000 mg | ORAL_TABLET | Freq: Every day | ORAL | Status: DC

## 2013-09-22 NOTE — Patient Instructions (Signed)
Your physician has recommended you make the following change in your medication:  1) INCREASE Isosorbide to 90mg  (1 and 1/2tablet) daily at bedtime  Take all other  Medication as prescribed  Your physician recommends that you schedule a follow-up appointment in: 3 weeks with the PA/NP

## 2013-09-22 NOTE — Progress Notes (Signed)
Patient ID: Ellen Marshall, female   DOB: 26-Apr-1926, 80 y.o.   MRN: [id number] Past Medical History  Hypertension   Hyperlipidemia   Macular degeneration   Glaucoma   History of stroke- multiple events, first one at age 5 and then 4 more events apahsia and right sided paralysis   Reflux   Osteoarthritis   Osteoporosis   Dysphasia as a residual effect of stroke   Abnormal nuclear stress tests in 2005 in 2005 and 2012 (apical/lateral ischemia)   Memory loss   gi bleed requiring hospitalization, hemoglobin 6.2, EGD showed blood in the stomach, no specific site identified 10/2011   05/07/12 echocardiogram with EF 42-70%, grade 2 diastolic dysfunction   hospitalized for pneumonia and syncope and bradycardia, January, 2015      [address] [place], [place]  [address] Phone: [phone] Fax:  [phone]  Date:  09/22/2013   ID:  Ellen Marshall, DOB July 25, 1925, MRN [id number]  PCP:  Myrtis Ser, MD   ASSESSMENT:   1. Angina pectoris 2. Syncope has not recurred off spironolactone 3. Hypertension  PLAN:  1. Increase isosorbide to 90 mg each evening 2. No change in medication otherwise. 3. Clinical followup with practitioner in 3-4 weeks to ensure that episodes of discomfort have improved and that she is tolerating the isosorbide increase   SUBJECTIVE: Ellen Marshall is a 80 y.o. female who was recently hospitalized with syncope related to dehydration. Spiral lactone was discontinued. Spiral lactone never improved dyspnea. He did get run of lower extremity edema. Over the past several days she has had nocturnal chest pressure and left arm discomfort responsive to sublingual nitroglycerin. No episodes last a longer than 5 minutes. She feels fine today.   Wt Readings from Last 3 Encounters:  09/22/13 141 lb 12.8 oz (64.32 kg)  08/12/13 144 lb 6.4 oz (65.5 kg)  10/10/11 134 lb (60.782 kg)     Past Medical History  Diagnosis Date  . Coronary artery disease    . Hypertension   . GERD (gastroesophageal reflux disease)   . Shortness of breath     laying down  . Stroke     Current Outpatient Prescriptions  Medication Sig Dispense Refill  . Acetaminophen 650 MG TABS Take 1,300 mg by mouth 3 (three) times daily.      Marland Kitchen allopurinol (ZYLOPRIM) 100 MG tablet Take 100 mg by mouth at bedtime.      . ALPRAZolam (XANAX) 0.25 MG tablet Take 0.25 mg by mouth 2 (two) times daily. As needed for anxiety.      . Calcium Carbonate-Vitamin D (CALCIUM + D) 600-200 MG-UNIT TABS Take 1 tablet by mouth 2 (two) times daily.      . clopidogrel (PLAVIX) 75 MG tablet Take 75 mg by mouth daily with breakfast.      . diltiazem (DILACOR XR) 120 MG 24 hr capsule Take 120 mg by mouth daily.      . diphenoxylate-atropine (LOMOTIL) 2.5-0.025 MG per tablet Take 2 tablets by mouth 4 (four) times daily as needed for diarrhea or loose stools.       Marland Kitchen escitalopram (LEXAPRO) 20 MG tablet Take 20 mg by mouth every morning.      . ferrous sulfate 325 (65 FE) MG tablet Take 325 mg by mouth 2 (two) times daily with a meal.      . fexofenadine (ALLEGRA) 180 MG tablet Take 180 mg by mouth daily.      . isosorbide mononitrate (IMDUR) 60 MG 24  hr tablet Take 60 mg by mouth daily.      . Lutein 20 MG TABS Take 1 tablet by mouth every morning.      . metoprolol tartrate (LOPRESSOR) 25 MG tablet Take 12.5 mg by mouth 2 (two) times daily.      . Multiple Vitamins-Minerals (CENTRUM SILVER PO) Take 1 tablet by mouth daily at 12 noon.      . nitroGLYCERIN (NITROSTAT) 0.4 MG SL tablet Place 0.4 mg under the tongue every 5 (five) minutes as needed. As needed for chest pain.      . pantoprazole (PROTONIX) 40 MG tablet Take 40 mg by mouth daily.      Marland Kitchen saccharomyces boulardii (FLORASTOR) 250 MG capsule Take 250 mg by mouth 2 (two) times daily.      . timolol (TIMOPTIC-XR) 0.5 % ophthalmic gel-forming Place 1 drop into both eyes daily.      . travoprost, benzalkonium, (TRAVATAN) 0.004 % ophthalmic  solution Place 1 drop into both eyes at bedtime.      . traZODone (DESYREL) 50 MG tablet Take 150 mg by mouth at bedtime.      . pantoprazole (PROTONIX) 40 MG tablet Take 1 tablet (40 mg total) by mouth 2 (two) times daily before a meal.       No current facility-administered medications for this visit.    Allergies:    Allergies  Allergen Reactions  . Amoxicillin Diarrhea  . Sulfa Antibiotics Itching    Social History:  The patient  reports that she has never smoked. She has never used smokeless tobacco. She reports that she does not drink alcohol or use illicit drugs.   ROS:  Please see the history of present illness.    All other systems reviewed and negative.   OBJECTIVE: VS:  BP 124/58  Pulse 71  Ht 5\' 3"  (1.6 m)  Wt 141 lb 12.8 oz (64.32 kg)  BMI 25.13 kg/m2  SpO2 97% Well nourished, well developed, in no acute distress, elderly HEENT: normal Neck: JVD flat. Carotid bruit absent  Cardiac:  normal S1, S2; RRR; no murmur Lungs:  clear to auscultation bilaterally, no wheezing, rhonchi or rales Abd: soft, nontender, no hepatomegaly Ext: Edema absent. Pulses 2+ Skin: warm and dry Neuro:  CNs 2-12 intact, no focal abnormalities noted  EKG:  Not repeated       Signed, Illene Labrador III, MD 09/22/2013 2:30 PM

## 2013-10-13 ENCOUNTER — Ambulatory Visit (INDEPENDENT_AMBULATORY_CARE_PROVIDER_SITE_OTHER): Payer: Medicare Other | Admitting: Nurse Practitioner

## 2013-10-13 ENCOUNTER — Encounter: Payer: Self-pay | Admitting: Nurse Practitioner

## 2013-10-13 VITALS — BP 120/60 | HR 55 | Ht 61.0 in | Wt 142.4 lb

## 2013-10-13 DIAGNOSIS — R079 Chest pain, unspecified: Secondary | ICD-10-CM

## 2013-10-13 DIAGNOSIS — I498 Other specified cardiac arrhythmias: Secondary | ICD-10-CM

## 2013-10-13 DIAGNOSIS — R0609 Other forms of dyspnea: Secondary | ICD-10-CM

## 2013-10-13 DIAGNOSIS — I2510 Atherosclerotic heart disease of native coronary artery without angina pectoris: Secondary | ICD-10-CM

## 2013-10-13 DIAGNOSIS — R0600 Dyspnea, unspecified: Secondary | ICD-10-CM

## 2013-10-13 DIAGNOSIS — R0989 Other specified symptoms and signs involving the circulatory and respiratory systems: Secondary | ICD-10-CM | POA: Diagnosis not present

## 2013-10-13 DIAGNOSIS — R001 Bradycardia, unspecified: Secondary | ICD-10-CM

## 2013-10-13 LAB — BASIC METABOLIC PANEL
BUN: 15 mg/dL (ref 6–23)
CO2: 29 mEq/L (ref 19–32)
Calcium: 9.3 mg/dL (ref 8.4–10.5)
Chloride: 98 mEq/L (ref 96–112)
Creatinine, Ser: 1.1 mg/dL (ref 0.4–1.2)
GFR: 52.32 mL/min — ABNORMAL LOW (ref >60.00)
Glucose, Bld: 90 mg/dL (ref 70–99)
Potassium: 4.2 mEq/L (ref 3.5–5.1)
Sodium: 133 mEq/L — ABNORMAL LOW (ref 135–145)

## 2013-10-13 LAB — URINALYSIS
Bilirubin Urine: NEGATIVE
Hgb urine dipstick: NEGATIVE
Ketones, ur: NEGATIVE
Leukocytes, UA: NEGATIVE
Nitrite: NEGATIVE
Specific Gravity, Urine: <=1.005 — AB (ref 1.000–1.030)
Total Protein, Urine: NEGATIVE
Urine Glucose: NEGATIVE
Urobilinogen, UA: 0.2 (ref 0.0–1.0)
pH: 6 (ref 5.0–8.0)

## 2013-10-13 NOTE — Patient Instructions (Signed)
We will check lab today and a urinalysis  Stay on your current medicines  See Dr. Tamala Julian in 3 to 4 months  Call the [place] office at [phone] if you have any questions, problems or concerns.

## 2013-10-13 NOTE — Progress Notes (Signed)
Ellen Marshall Date of Birth: 1926/06/21 Medical Record #[id number]  History of Present Illness: Ms. Borror is seen back today for a follow up visit. Seen for Dr. Tamala Julian. She has a history of HTN, HLD, macular degeneration, galucoma, prior strokes - on Plavix, memory loss, GERD, OA, pneumonia, prior GI bleed and diastolic dysfunction. Was hospitalized back in January with syncope - felt to be dehydrated - aldactone was stopped.  Saw Dr. Tamala Julian earlier this month - lots of symptoms which included chest pain/arm pain - he increased her Imdur.  Comes back today. Here with her son. Seems like she is at her baseline. Son says she has been this way for the past 3 to 4 years. No more arm pain. No actual chest pain. Has a pain under her left breast going around to her back. Still short of breath. No cough. No swelling. Had diarrhea over the weekend - that is not unusual. Felt swollen prior to that.   Current Outpatient Prescriptions  Medication Sig Dispense Refill  . Acetaminophen 650 MG TABS Take 1,300 mg by mouth 3 (three) times daily.      Marland Kitchen allopurinol (ZYLOPRIM) 100 MG tablet Take 100 mg by mouth at bedtime.      . ALPRAZolam (XANAX) 0.25 MG tablet Take 0.25 mg by mouth 2 (two) times daily. As needed for anxiety.      . Calcium Carbonate-Vitamin D (CALCIUM + D) 600-200 MG-UNIT TABS Take 1 tablet by mouth 2 (two) times daily.      . clopidogrel (PLAVIX) 75 MG tablet Take 75 mg by mouth daily with breakfast.      . diltiazem (DILACOR XR) 120 MG 24 hr capsule Take 120 mg by mouth daily.      . diphenoxylate-atropine (LOMOTIL) 2.5-0.025 MG per tablet Take 2 tablets by mouth 4 (four) times daily as needed for diarrhea or loose stools.       Marland Kitchen escitalopram (LEXAPRO) 20 MG tablet Take 20 mg by mouth every morning.      . ferrous sulfate 325 (65 FE) MG tablet Take 325 mg by mouth 2 (two) times daily with a meal.      . fexofenadine (ALLEGRA) 180 MG tablet Take 180 mg by mouth daily.      . isosorbide  mononitrate (IMDUR) 60 MG 24 hr tablet Take 1.5 tablets (90 mg total) by mouth at bedtime.  45 tablet  11  . Lutein 20 MG TABS Take 1 tablet by mouth every morning.      . metoprolol tartrate (LOPRESSOR) 25 MG tablet Take 12.5 mg by mouth 2 (two) times daily.      . Multiple Vitamins-Minerals (CENTRUM SILVER PO) Take 1 tablet by mouth daily at 12 noon.      . nitroGLYCERIN (NITROSTAT) 0.4 MG SL tablet Place 0.4 mg under the tongue every 5 (five) minutes as needed. As needed for chest pain.      . pantoprazole (PROTONIX) 40 MG tablet Take 40 mg by mouth daily.      Marland Kitchen saccharomyces boulardii (FLORASTOR) 250 MG capsule Take 250 mg by mouth 2 (two) times daily.      . timolol (TIMOPTIC-XR) 0.5 % ophthalmic gel-forming Place 1 drop into both eyes daily.      . travoprost, benzalkonium, (TRAVATAN) 0.004 % ophthalmic solution Place 1 drop into both eyes at bedtime.      . traZODone (DESYREL) 50 MG tablet Take 150 mg by mouth at bedtime.      . pantoprazole (  PROTONIX) 40 MG tablet Take 1 tablet (40 mg total) by mouth 2 (two) times daily before a meal.       No current facility-administered medications for this visit.    Allergies  Allergen Reactions  . Amoxicillin Diarrhea  . Sulfa Antibiotics Itching    Past Medical History  Diagnosis Date  . Coronary artery disease   . Hypertension   . GERD (gastroesophageal reflux disease)   . Shortness of breath     laying down  . Stroke     Past Surgical History  Procedure Laterality Date  . Colonscopy    . Esophagogastroduodenoscopy    . Stretching of egd    . Abdominal hysterectomy    . Esophagogastroduodenoscopy  10/10/2011    Procedure: ESOPHAGOGASTRODUODENOSCOPY (EGD);  Surgeon: Winfield Cunas., MD;  Location: [facility] ENDOSCOPY;  Service: Endoscopy;  Laterality: N/A;  with control of bleeding  . Esophagogastroduodenoscopy  10/12/2011    Procedure: ESOPHAGOGASTRODUODENOSCOPY (EGD);  Surgeon: Winfield Cunas., MD;  Location: [facility] ENDOSCOPY;   Service: Endoscopy;  Laterality: N/A;  . Flexible sigmoidoscopy  10/12/2011    Procedure: FLEXIBLE SIGMOIDOSCOPY;  Surgeon: Winfield Cunas., MD;  Location: [facility] ENDOSCOPY;  Service: Endoscopy;  Laterality: N/A;    History  Smoking status  . Never Smoker   Smokeless tobacco  . Never Used    History  Alcohol Use No    Comment: none in 20 years - 08/12/13    History reviewed. No pertinent family history.  Review of Systems: The review of systems is per the HPI.  All other systems were reviewed and are negative.  Physical Exam: BP 120/60  Pulse 55  Ht 5\' 1"  (1.549 m)  Wt 142 lb 6.4 oz (64.592 kg)  BMI 26.92 kg/m2  SpO2 99% Patient is an elderly female who looks chronically ill but in no acute distress. Skin is warm and dry. Color is normal.  HEENT is unremarkable. Normocephalic/atraumatic. PERRL. Sclera are nonicteric. Neck is supple. No masses. No JVD. Lungs are clear. Cardiac exam shows a regular rate and rhythm. She has palpable left flank pain. Abdomen is soft. Extremities are without significant edema. Gait and ROM are intact but she is using a walker. No gross neurologic deficits noted.   LABORATORY DATA: EKG shows sinus brady - no acute changes - reviewed with Dr. Tamala Julian.  Lab Results  Component Value Date   WBC 10.3 08/13/2013   HGB 11.4* 08/13/2013   HCT 34.9* 08/13/2013   PLT 376 08/13/2013   GLUCOSE 93 08/14/2013   ALT 7 08/13/2013   AST 13 08/13/2013   NA 137 08/14/2013   K 3.8 08/14/2013   CL 101 08/14/2013   CREATININE 0.94 08/14/2013   BUN 16 08/14/2013   CO2 22 08/14/2013   TSH 3.341 08/12/2013   Echo Study Conclusions from January 2015  - Left ventricle: The cavity size was normal. Wall thickness was normal. Systolic function was normal. The estimated ejection fraction was in the range of 60% to 65%. Wall motion was normal; there were no regional wall motion abnormalities. Doppler parameters are consistent with abnormal left ventricular relaxation (grade 1  diastolic dysfunction). - Mitral valve: Calcified annulus.    Assessment / Plan: 1. Presumed angina - history of CAD noted in her chart with past apical/lateral ischemia on remote Myoview from 2012 - managed medically. Seems improved.  2. HTN - BP ok  3. Resting bradycardia  4. Dyspnea - recheck her labs  5. Recent  bout of diarrhea - recheck lab today and check UA due to palpable flank pain.  Discussed with Dr. Tamala Julian - felt to be improved. See her back in about 3 to 4 months. No further medicine changes.  Patient is agreeable to this plan and will call if any problems develop in the interim.   Burtis Junes, RN, [place] [address] [place], [place]  [id number] [phone]

## 2013-10-14 LAB — CBC
HCT: 39.2 % (ref 36.0–46.0)
Hemoglobin: 13 g/dL (ref 12.0–15.0)
MCHC: 33.2 g/dL (ref 30.0–36.0)
MCV: 98.2 fl (ref 78.0–100.0)
Platelets: 446 10*3/uL — ABNORMAL HIGH (ref 150.0–400.0)
RBC: 3.99 Mil/uL (ref 3.87–5.11)
RDW: 16 % — ABNORMAL HIGH (ref 11.5–14.6)
WBC: 11.5 10*3/uL — ABNORMAL HIGH (ref 4.5–10.5)

## 2013-10-28 DIAGNOSIS — I498 Other specified cardiac arrhythmias: Secondary | ICD-10-CM | POA: Diagnosis not present

## 2013-10-28 DIAGNOSIS — R197 Diarrhea, unspecified: Secondary | ICD-10-CM | POA: Diagnosis not present

## 2013-10-28 DIAGNOSIS — R55 Syncope and collapse: Secondary | ICD-10-CM | POA: Diagnosis not present

## 2013-10-28 DIAGNOSIS — I10 Essential (primary) hypertension: Secondary | ICD-10-CM | POA: Diagnosis not present

## 2013-12-04 DIAGNOSIS — H353190 Nonexudative age-related macular degeneration, unspecified eye, stage unspecified: Secondary | ICD-10-CM | POA: Diagnosis not present

## 2013-12-09 DIAGNOSIS — M25519 Pain in unspecified shoulder: Secondary | ICD-10-CM | POA: Diagnosis not present

## 2013-12-09 DIAGNOSIS — R197 Diarrhea, unspecified: Secondary | ICD-10-CM | POA: Diagnosis not present

## 2013-12-16 DIAGNOSIS — R279 Unspecified lack of coordination: Secondary | ICD-10-CM | POA: Diagnosis not present

## 2013-12-16 DIAGNOSIS — M542 Cervicalgia: Secondary | ICD-10-CM | POA: Diagnosis not present

## 2013-12-16 DIAGNOSIS — M25519 Pain in unspecified shoulder: Secondary | ICD-10-CM | POA: Diagnosis not present

## 2013-12-16 DIAGNOSIS — M6281 Muscle weakness (generalized): Secondary | ICD-10-CM | POA: Diagnosis not present

## 2013-12-17 DIAGNOSIS — M25519 Pain in unspecified shoulder: Secondary | ICD-10-CM | POA: Diagnosis not present

## 2013-12-17 DIAGNOSIS — M542 Cervicalgia: Secondary | ICD-10-CM | POA: Diagnosis not present

## 2013-12-17 DIAGNOSIS — R279 Unspecified lack of coordination: Secondary | ICD-10-CM | POA: Diagnosis not present

## 2013-12-17 DIAGNOSIS — M6281 Muscle weakness (generalized): Secondary | ICD-10-CM | POA: Diagnosis not present

## 2013-12-18 DIAGNOSIS — M25519 Pain in unspecified shoulder: Secondary | ICD-10-CM | POA: Diagnosis not present

## 2013-12-18 DIAGNOSIS — R279 Unspecified lack of coordination: Secondary | ICD-10-CM | POA: Diagnosis not present

## 2013-12-18 DIAGNOSIS — M542 Cervicalgia: Secondary | ICD-10-CM | POA: Diagnosis not present

## 2013-12-18 DIAGNOSIS — M6281 Muscle weakness (generalized): Secondary | ICD-10-CM | POA: Diagnosis not present

## 2013-12-22 DIAGNOSIS — M25519 Pain in unspecified shoulder: Secondary | ICD-10-CM | POA: Diagnosis not present

## 2013-12-22 DIAGNOSIS — M6281 Muscle weakness (generalized): Secondary | ICD-10-CM | POA: Diagnosis not present

## 2013-12-22 DIAGNOSIS — M542 Cervicalgia: Secondary | ICD-10-CM | POA: Diagnosis not present

## 2013-12-22 DIAGNOSIS — R279 Unspecified lack of coordination: Secondary | ICD-10-CM | POA: Diagnosis not present

## 2013-12-23 DIAGNOSIS — R279 Unspecified lack of coordination: Secondary | ICD-10-CM | POA: Diagnosis not present

## 2013-12-23 DIAGNOSIS — M25519 Pain in unspecified shoulder: Secondary | ICD-10-CM | POA: Diagnosis not present

## 2013-12-23 DIAGNOSIS — M542 Cervicalgia: Secondary | ICD-10-CM | POA: Diagnosis not present

## 2013-12-23 DIAGNOSIS — M6281 Muscle weakness (generalized): Secondary | ICD-10-CM | POA: Diagnosis not present

## 2013-12-24 DIAGNOSIS — M542 Cervicalgia: Secondary | ICD-10-CM | POA: Diagnosis not present

## 2013-12-24 DIAGNOSIS — M6281 Muscle weakness (generalized): Secondary | ICD-10-CM | POA: Diagnosis not present

## 2013-12-24 DIAGNOSIS — M25519 Pain in unspecified shoulder: Secondary | ICD-10-CM | POA: Diagnosis not present

## 2013-12-24 DIAGNOSIS — R279 Unspecified lack of coordination: Secondary | ICD-10-CM | POA: Diagnosis not present

## 2013-12-25 DIAGNOSIS — R279 Unspecified lack of coordination: Secondary | ICD-10-CM | POA: Diagnosis not present

## 2013-12-25 DIAGNOSIS — M542 Cervicalgia: Secondary | ICD-10-CM | POA: Diagnosis not present

## 2013-12-25 DIAGNOSIS — M6281 Muscle weakness (generalized): Secondary | ICD-10-CM | POA: Diagnosis not present

## 2013-12-25 DIAGNOSIS — M25519 Pain in unspecified shoulder: Secondary | ICD-10-CM | POA: Diagnosis not present

## 2013-12-29 DIAGNOSIS — M542 Cervicalgia: Secondary | ICD-10-CM | POA: Diagnosis not present

## 2013-12-29 DIAGNOSIS — M6281 Muscle weakness (generalized): Secondary | ICD-10-CM | POA: Diagnosis not present

## 2013-12-29 DIAGNOSIS — M25519 Pain in unspecified shoulder: Secondary | ICD-10-CM | POA: Diagnosis not present

## 2013-12-29 DIAGNOSIS — R279 Unspecified lack of coordination: Secondary | ICD-10-CM | POA: Diagnosis not present

## 2013-12-30 DIAGNOSIS — M6281 Muscle weakness (generalized): Secondary | ICD-10-CM | POA: Diagnosis not present

## 2013-12-30 DIAGNOSIS — M25519 Pain in unspecified shoulder: Secondary | ICD-10-CM | POA: Diagnosis not present

## 2013-12-30 DIAGNOSIS — R279 Unspecified lack of coordination: Secondary | ICD-10-CM | POA: Diagnosis not present

## 2013-12-30 DIAGNOSIS — M542 Cervicalgia: Secondary | ICD-10-CM | POA: Diagnosis not present

## 2014-01-01 DIAGNOSIS — R279 Unspecified lack of coordination: Secondary | ICD-10-CM | POA: Diagnosis not present

## 2014-01-01 DIAGNOSIS — M6281 Muscle weakness (generalized): Secondary | ICD-10-CM | POA: Diagnosis not present

## 2014-01-01 DIAGNOSIS — M542 Cervicalgia: Secondary | ICD-10-CM | POA: Diagnosis not present

## 2014-01-01 DIAGNOSIS — M25519 Pain in unspecified shoulder: Secondary | ICD-10-CM | POA: Diagnosis not present

## 2014-01-05 ENCOUNTER — Encounter: Payer: Self-pay | Admitting: Interventional Cardiology

## 2014-01-05 ENCOUNTER — Ambulatory Visit (INDEPENDENT_AMBULATORY_CARE_PROVIDER_SITE_OTHER): Payer: Medicare Other | Admitting: Interventional Cardiology

## 2014-01-05 VITALS — BP 120/60 | HR 53 | Ht 61.0 in | Wt 140.0 lb

## 2014-01-05 DIAGNOSIS — I498 Other specified cardiac arrhythmias: Secondary | ICD-10-CM | POA: Diagnosis not present

## 2014-01-05 DIAGNOSIS — M542 Cervicalgia: Secondary | ICD-10-CM | POA: Diagnosis not present

## 2014-01-05 DIAGNOSIS — M25519 Pain in unspecified shoulder: Secondary | ICD-10-CM | POA: Diagnosis not present

## 2014-01-05 DIAGNOSIS — I10 Essential (primary) hypertension: Secondary | ICD-10-CM

## 2014-01-05 DIAGNOSIS — I2510 Atherosclerotic heart disease of native coronary artery without angina pectoris: Secondary | ICD-10-CM | POA: Diagnosis not present

## 2014-01-05 DIAGNOSIS — M6281 Muscle weakness (generalized): Secondary | ICD-10-CM | POA: Diagnosis not present

## 2014-01-05 DIAGNOSIS — R001 Bradycardia, unspecified: Secondary | ICD-10-CM

## 2014-01-05 DIAGNOSIS — R279 Unspecified lack of coordination: Secondary | ICD-10-CM | POA: Diagnosis not present

## 2014-01-05 MED ORDER — ISOSORBIDE MONONITRATE ER 120 MG PO TB24: 120.0000 mg | ORAL_TABLET | Freq: Every day | ORAL | Status: DC

## 2014-01-05 NOTE — Progress Notes (Signed)
Patient ID: Ellen Marshall, female   DOB: [birth year], 80 y.o.   MRN: [id number]    [address] [place], [place]  [address] Phone: [phone] Fax:  [phone]  Date:  01/05/2014   ID:  Ellen Marshall, DOB 02-10-1926, MRN [id number]  PCP:  Henrine Screws, MD   ASSESSMENT:  1. Hypertension, controlled 2. Diastolic heart failure, stable 3. Coronary artery disease with abnormal myocardial perfusion studies demonstrating apical ischemia 4. Recurring and nitroglycerin use with resolution of anginal quality chest pain usually within 3-5 minutes   PLAN:  1. Increase isosorbide mononitrate 120 mg per day 2. 6-9 month followup   SUBJECTIVE: Ellen Marshall is a 80 y.o. female who is having increasing episodes of angina. 3-5 times per week she needs to use nitroglycerin for discomfort occurs at rest. She denies associated dyspnea. There is two-pillow orthopnea. She is limited in her physical abilities. There is dyspnea on exertion. She denies lower extremity swelling.   Wt Readings from Last 3 Encounters:  01/05/14 140 lb (63.504 kg)  10/13/13 142 lb 6.4 oz (64.592 kg)  09/22/13 141 lb 12.8 oz (64.32 kg)     Past Medical History  Diagnosis Date  . Coronary artery disease   . Hypertension   . GERD (gastroesophageal reflux disease)   . Shortness of breath     laying down  . Stroke     Current Outpatient Prescriptions  Medication Sig Dispense Refill  . Acetaminophen 650 MG TABS Take 1,300 mg by mouth 3 (three) times daily.      Marland Kitchen ALPRAZolam (XANAX) 0.25 MG tablet Take 0.25 mg by mouth 2 (two) times daily. As needed for anxiety.      . Calcium Carbonate-Vitamin D (CALCIUM + D) 600-200 MG-UNIT TABS Take 1 tablet by mouth 2 (two) times daily.      . clopidogrel (PLAVIX) 75 MG tablet Take 75 mg by mouth daily after lunch.       . diltiazem (DILACOR XR) 120 MG 24 hr capsule Take 120 mg by mouth daily.      . diphenoxylate-atropine (LOMOTIL) 2.5-0.025 MG per tablet Take 2  tablets by mouth 4 (four) times daily as needed for diarrhea or loose stools.       Marland Kitchen escitalopram (LEXAPRO) 20 MG tablet Take 20 mg by mouth every morning.      . ferrous sulfate 325 (65 FE) MG tablet Take 325 mg by mouth 2 (two) times daily with a meal.      . fexofenadine (ALLEGRA) 180 MG tablet Take 180 mg by mouth daily.      . isosorbide mononitrate (IMDUR) 60 MG 24 hr tablet Take 1.5 tablets (90 mg total) by mouth at bedtime.  45 tablet  11  . Lutein 20 MG TABS Take 1 tablet by mouth every morning.      . metoprolol tartrate (LOPRESSOR) 25 MG tablet Take 12.5 mg by mouth 2 (two) times daily.      . Multiple Vitamins-Minerals (CENTRUM SILVER PO) Take 1 tablet by mouth daily at 12 noon.      . nitroGLYCERIN (NITROSTAT) 0.4 MG SL tablet Place 0.4 mg under the tongue every 5 (five) minutes as needed. As needed for chest pain.      . pantoprazole (PROTONIX) 40 MG tablet Take 40 mg by mouth daily.      Marland Kitchen saccharomyces boulardii (FLORASTOR) 250 MG capsule Take 250 mg by mouth 2 (two) times daily.      . timolol (  TIMOPTIC-XR) 0.5 % ophthalmic gel-forming Place 1 drop into both eyes daily.      . travoprost, benzalkonium, (TRAVATAN) 0.004 % ophthalmic solution Place 1 drop into both eyes at bedtime.      . traZODone (DESYREL) 50 MG tablet Take 150 mg by mouth at bedtime.       No current facility-administered medications for this visit.    Allergies:    Allergies  Allergen Reactions  . Amoxicillin Diarrhea  . Sulfa Antibiotics Itching    Social History:  The patient  reports that she has never smoked. She has never used smokeless tobacco. She reports that she does not drink alcohol or use illicit drugs.   ROS:  Please see the history of present illness.   No transient neurological complaints.   All other systems reviewed and negative.   OBJECTIVE: VS:  BP 120/60  Pulse 53  Ht 5\' 1"  (1.549 m)  Wt 140 lb (63.504 kg)  BMI 26.47 kg/m2 Well nourished, well developed, in no acute distress,  elderly and compatible with age 26: normal Neck: JVD flat. Carotid bruit absent  Cardiac:  normal S1, S2; RRR; no murmur Lungs:  clear to auscultation bilaterally, no wheezing, rhonchi or rales Abd: soft, nontender, no hepatomegaly Ext: Edema absent. Pulses 2+ and symmetric Skin: warm and dry Neuro:  CNs 2-12 intact, no focal abnormalities noted  EKG:  Not repeated       Signed, Illene Labrador III, MD 01/05/2014 3:44 PM

## 2014-01-05 NOTE — Patient Instructions (Signed)
Your physician has recommended you make the following change in your medication:  1) INCREASE Isosorbide 120mg  daily. An Rx has been sent to your pharmacy  Take all other medications as prescribed  Your physician wants you to follow-up in: 6-9 months You will receive a reminder letter in the mail two months in advance. If you don't receive a letter, please call our office to schedule the follow-up appointment.

## 2014-01-06 ENCOUNTER — Encounter: Payer: Self-pay | Admitting: Interventional Cardiology

## 2014-01-06 DIAGNOSIS — M6281 Muscle weakness (generalized): Secondary | ICD-10-CM | POA: Diagnosis not present

## 2014-01-06 DIAGNOSIS — M25519 Pain in unspecified shoulder: Secondary | ICD-10-CM | POA: Diagnosis not present

## 2014-01-06 DIAGNOSIS — R279 Unspecified lack of coordination: Secondary | ICD-10-CM | POA: Diagnosis not present

## 2014-01-06 DIAGNOSIS — M542 Cervicalgia: Secondary | ICD-10-CM | POA: Diagnosis not present

## 2014-01-07 DIAGNOSIS — M6281 Muscle weakness (generalized): Secondary | ICD-10-CM | POA: Diagnosis not present

## 2014-01-07 DIAGNOSIS — M25519 Pain in unspecified shoulder: Secondary | ICD-10-CM | POA: Diagnosis not present

## 2014-01-07 DIAGNOSIS — R279 Unspecified lack of coordination: Secondary | ICD-10-CM | POA: Diagnosis not present

## 2014-01-07 DIAGNOSIS — M542 Cervicalgia: Secondary | ICD-10-CM | POA: Diagnosis not present

## 2014-01-08 DIAGNOSIS — M542 Cervicalgia: Secondary | ICD-10-CM | POA: Diagnosis not present

## 2014-01-08 DIAGNOSIS — M25519 Pain in unspecified shoulder: Secondary | ICD-10-CM | POA: Diagnosis not present

## 2014-01-08 DIAGNOSIS — R279 Unspecified lack of coordination: Secondary | ICD-10-CM | POA: Diagnosis not present

## 2014-01-08 DIAGNOSIS — M6281 Muscle weakness (generalized): Secondary | ICD-10-CM | POA: Diagnosis not present

## 2014-01-12 DIAGNOSIS — M6281 Muscle weakness (generalized): Secondary | ICD-10-CM | POA: Diagnosis not present

## 2014-01-12 DIAGNOSIS — M25519 Pain in unspecified shoulder: Secondary | ICD-10-CM | POA: Diagnosis not present

## 2014-01-12 DIAGNOSIS — M542 Cervicalgia: Secondary | ICD-10-CM | POA: Diagnosis not present

## 2014-01-12 DIAGNOSIS — R279 Unspecified lack of coordination: Secondary | ICD-10-CM | POA: Diagnosis not present

## 2014-01-13 DIAGNOSIS — M6281 Muscle weakness (generalized): Secondary | ICD-10-CM | POA: Diagnosis not present

## 2014-01-13 DIAGNOSIS — M542 Cervicalgia: Secondary | ICD-10-CM | POA: Diagnosis not present

## 2014-01-13 DIAGNOSIS — M25519 Pain in unspecified shoulder: Secondary | ICD-10-CM | POA: Diagnosis not present

## 2014-01-13 DIAGNOSIS — R279 Unspecified lack of coordination: Secondary | ICD-10-CM | POA: Diagnosis not present

## 2014-01-14 DIAGNOSIS — M542 Cervicalgia: Secondary | ICD-10-CM | POA: Diagnosis not present

## 2014-01-14 DIAGNOSIS — M25519 Pain in unspecified shoulder: Secondary | ICD-10-CM | POA: Diagnosis not present

## 2014-01-14 DIAGNOSIS — M6281 Muscle weakness (generalized): Secondary | ICD-10-CM | POA: Diagnosis not present

## 2014-01-14 DIAGNOSIS — R279 Unspecified lack of coordination: Secondary | ICD-10-CM | POA: Diagnosis not present

## 2014-01-15 DIAGNOSIS — M542 Cervicalgia: Secondary | ICD-10-CM | POA: Diagnosis not present

## 2014-01-15 DIAGNOSIS — M25519 Pain in unspecified shoulder: Secondary | ICD-10-CM | POA: Diagnosis not present

## 2014-01-15 DIAGNOSIS — M6281 Muscle weakness (generalized): Secondary | ICD-10-CM | POA: Diagnosis not present

## 2014-01-15 DIAGNOSIS — R279 Unspecified lack of coordination: Secondary | ICD-10-CM | POA: Diagnosis not present

## 2014-01-22 DIAGNOSIS — M542 Cervicalgia: Secondary | ICD-10-CM | POA: Diagnosis not present

## 2014-01-22 DIAGNOSIS — M6281 Muscle weakness (generalized): Secondary | ICD-10-CM | POA: Diagnosis not present

## 2014-01-22 DIAGNOSIS — R279 Unspecified lack of coordination: Secondary | ICD-10-CM | POA: Diagnosis not present

## 2014-01-22 DIAGNOSIS — M25519 Pain in unspecified shoulder: Secondary | ICD-10-CM | POA: Diagnosis not present

## 2014-01-23 DIAGNOSIS — R0989 Other specified symptoms and signs involving the circulatory and respiratory systems: Secondary | ICD-10-CM | POA: Diagnosis not present

## 2014-01-23 DIAGNOSIS — M25519 Pain in unspecified shoulder: Secondary | ICD-10-CM | POA: Diagnosis not present

## 2014-01-23 DIAGNOSIS — Z1331 Encounter for screening for depression: Secondary | ICD-10-CM | POA: Diagnosis not present

## 2014-01-23 DIAGNOSIS — M109 Gout, unspecified: Secondary | ICD-10-CM | POA: Diagnosis not present

## 2014-01-23 DIAGNOSIS — I10 Essential (primary) hypertension: Secondary | ICD-10-CM | POA: Diagnosis not present

## 2014-01-23 DIAGNOSIS — Z0000 Encounter for general adult medical examination without abnormal findings: Secondary | ICD-10-CM | POA: Diagnosis not present

## 2014-01-23 DIAGNOSIS — R197 Diarrhea, unspecified: Secondary | ICD-10-CM | POA: Diagnosis not present

## 2014-01-23 DIAGNOSIS — R0609 Other forms of dyspnea: Secondary | ICD-10-CM | POA: Diagnosis not present

## 2014-01-23 DIAGNOSIS — I2510 Atherosclerotic heart disease of native coronary artery without angina pectoris: Secondary | ICD-10-CM | POA: Diagnosis not present

## 2014-02-16 ENCOUNTER — Encounter: Payer: Self-pay | Admitting: *Deleted

## 2014-02-16 ENCOUNTER — Encounter: Payer: Self-pay | Admitting: Physician Assistant

## 2014-02-16 ENCOUNTER — Ambulatory Visit (INDEPENDENT_AMBULATORY_CARE_PROVIDER_SITE_OTHER): Payer: Medicare Other | Admitting: Physician Assistant

## 2014-02-16 ENCOUNTER — Other Ambulatory Visit: Payer: Self-pay | Admitting: *Deleted

## 2014-02-16 VITALS — BP 132/62 | HR 60 | Ht 61.0 in | Wt 140.0 lb

## 2014-02-16 DIAGNOSIS — I25119 Atherosclerotic heart disease of native coronary artery with unspecified angina pectoris: Secondary | ICD-10-CM

## 2014-02-16 DIAGNOSIS — I2510 Atherosclerotic heart disease of native coronary artery without angina pectoris: Secondary | ICD-10-CM | POA: Diagnosis not present

## 2014-02-16 DIAGNOSIS — R079 Chest pain, unspecified: Secondary | ICD-10-CM | POA: Diagnosis not present

## 2014-02-16 DIAGNOSIS — R0989 Other specified symptoms and signs involving the circulatory and respiratory systems: Secondary | ICD-10-CM

## 2014-02-16 DIAGNOSIS — I10 Essential (primary) hypertension: Secondary | ICD-10-CM

## 2014-02-16 DIAGNOSIS — I209 Angina pectoris, unspecified: Secondary | ICD-10-CM

## 2014-02-16 MED ORDER — RANOLAZINE ER 500 MG PO TB12: 500.0000 mg | ORAL_TABLET | Freq: Two times a day (BID) | ORAL | Status: DC

## 2014-02-16 NOTE — Assessment & Plan Note (Signed)
Stable

## 2014-02-16 NOTE — Progress Notes (Signed)
HPI: This is an 80 year old female patient Dr. Tamala Julian who has history of coronary artery disease with an abnormal myocardial perfusion study demonstrating apical ischemia in 2005. She also had a cath in another facility that demonstrated some blockage but not enough to open anything up. He last saw her in 01/05/2014 at which time she was having recurrent chest pain at rest relieved with nitroglycerin. He increased her Imdur were done 120 mg per day. She also has history of hypertension and diastolic heart failure, she's also had multiple strokes with more than 4 event since age 37 with a dysphasia and right-sided paralysis.  Patient comes in today accompanied by her son complaining of recurrent chest pain. He can do her at night has helped her sleep better without chest pain but she continues to have angina during the day at rest and with exertion relieved with one or 2 nitroglycerin. She had a severe episode 2 weeks ago in which her left arm became quite numb it went across her chest down her right arm and into her neck.It took 2 nitros and about 15 minutes to ease her pain. She becomes quite weak and unsteady afterwards. I had a long discussion with her and her son concerning the risks of a heart catheterization with her prior strokes and age. They would like to proceed with a Lexi scan Myoview.     Allergies-- Amoxicillin -- Diarrhea  -- Sulfa Antibiotics -- Itching  Current Outpatient Prescriptions on File Prior to Visit: Acetaminophen 650 MG TABS, Take 1,300 mg by mouth 3 (three) times daily., Disp: , Rfl:  ALPRAZolam (XANAX) 0.25 MG tablet, Take 0.25 mg by mouth 2 (two) times daily. As needed for anxiety., Disp: , Rfl:  Calcium Carbonate-Vitamin D (CALCIUM + D) 600-200 MG-UNIT TABS, Take 1 tablet by mouth 2 (two) times daily., Disp: , Rfl:  clopidogrel (PLAVIX) 75 MG tablet, Take 75 mg by mouth daily after lunch. , Disp: , Rfl:  diltiazem (DILACOR XR) 120 MG 24 hr capsule, Take 120 mg by mouth  daily., Disp: , Rfl:  diphenoxylate-atropine (LOMOTIL) 2.5-0.025 MG per tablet, Take 2 tablets by mouth 4 (four) times daily as needed for diarrhea or loose stools. , Disp: , Rfl:  escitalopram (LEXAPRO) 20 MG tablet, Take 20 mg by mouth every morning., Disp: , Rfl:  ferrous sulfate 325 (65 FE) MG tablet, Take 325 mg by mouth 2 (two) times daily with a meal., Disp: , Rfl:  fexofenadine (ALLEGRA) 180 MG tablet, Take 180 mg by mouth daily., Disp: , Rfl:  isosorbide mononitrate (IMDUR) 120 MG 24 hr tablet, Take 1 tablet (120 mg total) by mouth at bedtime., Disp: 90 tablet, Rfl: 3 Lutein 20 MG TABS, Take 1 tablet by mouth every morning., Disp: , Rfl:  metoprolol tartrate (LOPRESSOR) 25 MG tablet, Take 12.5 mg by mouth 2 (two) times daily., Disp: , Rfl:  Multiple Vitamins-Minerals (CENTRUM SILVER PO), Take 1 tablet by mouth daily at 12 noon., Disp: , Rfl:  nitroGLYCERIN (NITROSTAT) 0.4 MG SL tablet, Place 0.4 mg under the tongue every 5 (five) minutes as needed. As needed for chest pain., Disp: , Rfl:  pantoprazole (PROTONIX) 40 MG tablet, Take 40 mg by mouth daily., Disp: , Rfl:  saccharomyces boulardii (FLORASTOR) 250 MG capsule, Take 250 mg by mouth 2 (two) times daily., Disp: , Rfl:  timolol (TIMOPTIC-XR) 0.5 % ophthalmic gel-forming, Place 1 drop into both eyes daily., Disp: , Rfl:  travoprost, benzalkonium, (TRAVATAN) 0.004 % ophthalmic solution, Place 1 drop into both  eyes at bedtime., Disp: , Rfl:  traZODone (DESYREL) 50 MG tablet, Take 150 mg by mouth at bedtime., Disp: , Rfl:   No current facility-administered medications on file prior to visit.   Past Medical History:   Coronary artery disease                                      Hypertension                                                 GERD (gastroesophageal reflux disease)                       Shortness of breath                                            Comment:laying down   Stroke                                                       Past Surgical History:   colonscopy                                                    ESOPHAGOGASTRODUODENOSCOPY                                    stretching of EGD                                             ABDOMINAL HYSTERECTOMY                                        ESOPHAGOGASTRODUODENOSCOPY                       10/10/2011      Comment:Procedure: ESOPHAGOGASTRODUODENOSCOPY (EGD);                Surgeon: Winfield Cunas., MD;  Location[facility]              ENDOSCOPY;  Service: Endoscopy;  Laterality:               N/A;  with control of bleeding   ESOPHAGOGASTRODUODENOSCOPY                       10/12/2011      Comment:Procedure: ESOPHAGOGASTRODUODENOSCOPY (EGD);                Surgeon: Winfield Cunas., MD;  Location: [facility]  ENDOSCOPY;  Service: Endoscopy;  Laterality:               N/A;   FLEXIBLE SIGMOIDOSCOPY                           10/12/2011      Comment:Procedure: FLEXIBLE SIGMOIDOSCOPY;  Surgeon:               Winfield Cunas., MD;  Location: [facility]               ENDOSCOPY;  Service: Endoscopy;  Laterality:               N/A;  No family history on file.   Social History   Marital Status: Widowed             Spouse Name:                      Years of Education:                 Number of children:             Occupational History   None on file  Social History Main Topics   Smoking Status: Never Smoker                     Smokeless Status: Never Used                       Alcohol Use: No                Comment: none in 20 years - 08/12/13   Drug Use: No             Sexual Activity: Not on file        Other Topics            Concern   None on file  Social History Narrative   None on file    ROS: Walks with a walker very short distances   PHYSICAL EXAM: Elderly, in no acute distress. Neck: Bilateral carotid bruits right greater than left, No JVD, HJR,  or thyroid enlargement  Lungs: No tachypnea, clear without wheezing, rales, or  rhonchi  Cardiovascular: RRR, PMI not displaced, positive S4 and 2/6 systolic murmur at the left sternal border, no bruit, thrill, or heave.  Abdomen: BS normal. Soft without organomegaly, masses, lesions or tenderness.  Extremities: without cyanosis, clubbing or edema. Good distal pulses bilateral  SKin: Warm, no lesions or rashes   Musculoskeletal: No deformities  Neuro: no focal signs  BP 132/62  Pulse 60  Ht 5\' 1"  (1.549 m)  Wt 140 lb (63.504 kg)  BMI 26.47 kg/m2    EKG: Normal sinus rhythm no acute change  2-D echo 08/13/13 Study Conclusions  - Left ventricle: The cavity size was normal. Wall thickness   was normal. Systolic function was normal. The estimated   ejection fraction was in the range of 60% to 65%. Wall   motion was normal; there were no regional wall motion   abnormalities. Doppler parameters are consistent with   abnormal left ventricular relaxation (grade 1 diastolic   dysfunction). - Mitral valve: Calcified annulus. Impressions:  - Compared to the prior study, there has been no significant   interval change.

## 2014-02-16 NOTE — Assessment & Plan Note (Signed)
Patient has history of ischemia on stress test in 2005. She also had a cath demonstrating some disease but not enough to open anything. She is having recurrent angina at rest requiring 1-3 nitroglycerin for relief. I had along discussion with her and her son concerning the risks of heart catheterization. We will proceed to order a Lexi scan Myoview to rule out ischemia. I'm going to stop her Imdur were and try Ranexa to see if this helps. She will followup with Dr. Tamala Julian.

## 2014-02-16 NOTE — Assessment & Plan Note (Signed)
Patient has bilateral carotid bruits. Check carotid Dopplers.

## 2014-02-16 NOTE — Patient Instructions (Signed)
Your physician has recommended you make the following change in your medication:  1. Stop Imdur 2. Start Ranexa 500 mg 1 tab twice daily   Your physician has requested that you have a lexiscan myoview. For further information please visit [email or web address]. Please follow instruction sheet, as given.  Your physician has requested that you have a carotid duplex. This test is an ultrasound of the carotid arteries in your neck. It looks at blood flow through these arteries that supply the brain with blood. Allow one hour for this exam. There are no restrictions or special instructions.

## 2014-02-18 ENCOUNTER — Other Ambulatory Visit: Payer: Self-pay

## 2014-02-18 MED ORDER — RANOLAZINE ER 500 MG PO TB12: 500.0000 mg | ORAL_TABLET | Freq: Two times a day (BID) | ORAL | Status: DC

## 2014-02-24 ENCOUNTER — Ambulatory Visit (HOSPITAL_COMMUNITY): Payer: Medicare Other | Attending: Cardiology | Admitting: Radiology

## 2014-02-24 VITALS — BP 125/53 | Ht 63.0 in | Wt 142.0 lb

## 2014-02-24 DIAGNOSIS — I2510 Atherosclerotic heart disease of native coronary artery without angina pectoris: Secondary | ICD-10-CM | POA: Insufficient documentation

## 2014-02-24 DIAGNOSIS — Z8249 Family history of ischemic heart disease and other diseases of the circulatory system: Secondary | ICD-10-CM | POA: Insufficient documentation

## 2014-02-24 DIAGNOSIS — R0602 Shortness of breath: Secondary | ICD-10-CM

## 2014-02-24 DIAGNOSIS — R079 Chest pain, unspecified: Secondary | ICD-10-CM

## 2014-02-24 DIAGNOSIS — Z8673 Personal history of transient ischemic attack (TIA), and cerebral infarction without residual deficits: Secondary | ICD-10-CM | POA: Diagnosis not present

## 2014-02-24 MED ORDER — TECHNETIUM TC 99M SESTAMIBI GENERIC - CARDIOLITE
11.0000 | Freq: Once | INTRAVENOUS | Status: AC | PRN
Start: 2014-02-24 — End: 2014-02-24
  Administered 2014-02-24: 11 via INTRAVENOUS

## 2014-02-24 MED ORDER — TECHNETIUM TC 99M SESTAMIBI GENERIC - CARDIOLITE
33.0000 | Freq: Once | INTRAVENOUS | Status: AC | PRN
  Administered 2014-02-24: 33 via INTRAVENOUS

## 2014-02-24 MED ORDER — REGADENOSON 0.4 MG/5ML IV SOLN
0.4000 mg | Freq: Once | INTRAVENOUS | Status: AC
  Administered 2014-02-24: 0.4 mg via INTRAVENOUS

## 2014-02-24 NOTE — Progress Notes (Signed)
[facility] [address] [place], [place] [address] [phone]    Cardiology Nuclear Med Study  Ellen Marshall is a 80 y.o. female     MRN : [id number]     DOB: 06-11-1926  Procedure Date: 02/24/2014  Nuclear Med Background Indication for Stress Test:  Evaluation for Ischemia History:CAD;CATH (no CAD)ECHO 15' EF:60-65%:PREVIOUS NUCLEAR STUDT 2012'(ISCHEMIA) EF:83% Cardiac Risk Factors: CVA, Family History - CAD, Hypertension and RIGHT SIDE PAPRALYS'S FROM STROKE  Symptoms:  Chest Pain, DOE and Syncope   Nuclear Pre-Procedure Caffeine/Decaff Intake:  None NPO After: 5:30am   Lungs:  clear O2 Sat: 97% on room air. IV 0.9% NS with Angio Cath:  22g  IV Site: L Hand  IV Started by:  Perrin Maltese, EMT-P  Chest Size (in):  36 Cup Size: B  Height: 5\' 3"  (1.6 m)  Weight:  142 lb (64.411 kg)  BMI:  Body mass index is 25.16 kg/(m^2). Tech Comments:  n/a    Nuclear Med Study 1 or 2 day study: 1 day  Stress Test Type:  Lexiscan  Reading MD: n/a  Order Authorizing Provider:  Wyvonnia Lora and Lowanda Foster  Resting Radionuclide: Technetium 47m Sestamibi  Resting Radionuclide Dose: 11.0 mCi   Stress Radionuclide:  Technetium 61m Sestamibi  Stress Radionuclide Dose: 33.0 mCi           Stress Protocol Rest HR: 48 Stress HR: 74  Rest BP: 125/53 Stress BP: 156/52  Exercise Time (min): n/a METS: n/a   Predicted Max HR: 135 bpm % Max HR: 35.56 bpm Rate Pressure Product: 7488   Dose of Adenosine (mg):  n/a Dose of Lexiscan: 0.4 mg  Dose of Atropine (mg): n/a Dose of Dobutamine: n/a mcg/kg/min (at max HR)  Stress Test Technologist: Ileene Hutchinson, EMT-P  Nuclear Technologist:  Annye Rusk, CNMT     Rest Procedure:  Myocardial perfusion imaging was performed at rest 45 minutes following the intravenous administration of Technetium 45m Sestamibi. Rest ECG: NSR - Normal EKG  Stress Procedure:  The patient received IV Lexiscan 0.4 mg over  15-seconds.  Technetium 35m Sestamibi injected at 30-seconds.  Quantitative spect images were obtained after a 45 minute delay. Stress ECG: No significant change from baseline ECG  QPS Raw Data Images:  Normal; no motion artifact; normal heart/lung ratio. Stress Images:  Small, mild apical perfusion defect. Rest Images:  Small, mild apical perfusion defect. Subtraction (SDS):  Fixed small, mild apical perfusion defect. Transient Ischemic Dilatation (Normal <1.22):  1.02 Lung/Heart Ratio (Normal <0.45):  0.30  Quantitative Gated Spect Images QGS EDV:  61 ml QGS ESV:  19 ml  Impression Exercise Capacity:  Lexiscan with no exercise. BP Response:  Normal blood pressure response. Clinical Symptoms:  Dyspnea ECG Impression:  No significant ST segment change suggestive of ischemia. Comparison with Prior Nuclear Study: No images to compare  Overall Impression:  Low risk stress nuclear study with a small, mild fixed apical perfusion defect that may represent soft tissue attenuation.  No ischemia. .  LV Ejection Fraction: 69%.  LV Wall Motion:  NL LV Function; NL Wall Motion  [name] 02/24/2014

## 2014-02-27 ENCOUNTER — Ambulatory Visit (HOSPITAL_COMMUNITY): Payer: Medicare Other | Attending: Interventional Cardiology | Admitting: Cardiology

## 2014-02-27 ENCOUNTER — Telehealth: Payer: Self-pay | Admitting: *Deleted

## 2014-02-27 DIAGNOSIS — I6529 Occlusion and stenosis of unspecified carotid artery: Secondary | ICD-10-CM

## 2014-02-27 DIAGNOSIS — R0989 Other specified symptoms and signs involving the circulatory and respiratory systems: Secondary | ICD-10-CM | POA: Diagnosis present

## 2014-02-27 DIAGNOSIS — R079 Chest pain, unspecified: Secondary | ICD-10-CM

## 2014-02-27 NOTE — Telephone Encounter (Signed)
s/w pt's son Abbe Amsterdam who has POA for pt. He has been notified about myoview results with verbal understanding. per Estella Husk, pt scheduled today for f/u 9/9 SW, PA/HS 3:40

## 2014-02-27 NOTE — Progress Notes (Signed)
Carotid duplex performed 

## 2014-03-02 ENCOUNTER — Encounter: Payer: Self-pay | Admitting: Nurse Practitioner

## 2014-03-02 ENCOUNTER — Telehealth: Payer: Self-pay | Admitting: *Deleted

## 2014-03-02 NOTE — Telephone Encounter (Signed)
S/w pt's son aware of carotid results.

## 2014-03-10 ENCOUNTER — Encounter: Payer: Self-pay | Admitting: *Deleted

## 2014-03-25 ENCOUNTER — Ambulatory Visit (INDEPENDENT_AMBULATORY_CARE_PROVIDER_SITE_OTHER): Payer: Medicare Other | Admitting: Physician Assistant

## 2014-03-25 ENCOUNTER — Encounter: Payer: Self-pay | Admitting: Physician Assistant

## 2014-03-25 VITALS — BP 145/62 | HR 66 | Ht 61.0 in | Wt 141.0 lb

## 2014-03-25 DIAGNOSIS — I6529 Occlusion and stenosis of unspecified carotid artery: Secondary | ICD-10-CM

## 2014-03-25 DIAGNOSIS — I10 Essential (primary) hypertension: Secondary | ICD-10-CM | POA: Diagnosis not present

## 2014-03-25 DIAGNOSIS — I5032 Chronic diastolic (congestive) heart failure: Secondary | ICD-10-CM | POA: Diagnosis not present

## 2014-03-25 DIAGNOSIS — I658 Occlusion and stenosis of other precerebral arteries: Secondary | ICD-10-CM

## 2014-03-25 DIAGNOSIS — I25119 Atherosclerotic heart disease of native coronary artery with unspecified angina pectoris: Secondary | ICD-10-CM

## 2014-03-25 DIAGNOSIS — I2510 Atherosclerotic heart disease of native coronary artery without angina pectoris: Secondary | ICD-10-CM

## 2014-03-25 DIAGNOSIS — I209 Angina pectoris, unspecified: Secondary | ICD-10-CM | POA: Diagnosis not present

## 2014-03-25 DIAGNOSIS — R079 Chest pain, unspecified: Secondary | ICD-10-CM | POA: Diagnosis not present

## 2014-03-25 DIAGNOSIS — I6523 Occlusion and stenosis of bilateral carotid arteries: Secondary | ICD-10-CM

## 2014-03-25 MED ORDER — ISOSORBIDE MONONITRATE ER 120 MG PO TB24
120.0000 mg | ORAL_TABLET | Freq: Every day | ORAL | Status: DC
Start: 2014-03-25 — End: 2015-05-22

## 2014-03-25 NOTE — Progress Notes (Signed)
Cardiology Office Note    Date:  03/25/2014   ID:  Ellen Marshall, DOB [birth year], MRN [id number]  PCP:  Henrine Screws, MD  Cardiologist:  Dr. Daneen Schick    History of Present Illness: Ellen Marshall is a 80 y.o. female with a hx of CAD ([facility] done in [place] in ~ 2005 >> PCI not required; prior hx of nuclear study with apical ischemia in 2005), chronic angina, HTN, prior CVAs, diastolic CHF.  She was seen recently by Ermalinda Barrios, PA-C with increasing angina.  She was set up for a [employer] which demonstrated no ischemia.  She was placed on Ranexa (Imdur was stopped) and is brought back today for FU.  She notes some worsening chest pain since stopping Imdur.  She denies exertional chest symptoms.  She notes L chest and L arm pressure mainly at rest with associated dyspnea.  She denies syncope.  She denies orthopnea, PND, edema.     Studies:  - Echo (1/15):  EF 60-65%, no RWMA, Gr 1 DD, MAC  - Nuclear (8/15):  Apical defect (?soft tissue attenuation), EF 69%, no ischemia, low risk  - Carotid US (8/15):  Bilateral ICA 40-59% >> FU 1 year   Recent Labs/Images: 08/12/2013: TSH 3.341  08/13/2013: ALT 7  10/13/2013: Creatinine 1.1; Hemoglobin 13.0; Potassium 4.2   Dg Chest 2 View  08/12/2013   CLINICAL DATA:  Fall.  Syncope.  Hypertension.  EXAM: CHEST  2 VIEW  COMPARISON:  DG CHEST 2 VIEW dated 08/06/2013; CT HEAD W/O CM dated 08/12/2013; DG CHEST 2V dated 07/29/2012  FINDINGS: Cardiopericardial silhouette is within normal limits for projection. Aortic arch atherosclerosis. Lung volumes are low with bilateral basilar atelectasis. A abdominal aortic atherosclerosis is incidentally noted. Monitoring leads project over the chest. No displaced rib fractures or pneumothorax.  IMPRESSION: Low volume chest with basilar atelectasis.  No acute abnormality.   Electronically Signed   By: Dereck Ligas M.D.   On: 08/12/2013 19:50   Ct Head Wo Contrast  08/12/2013   CLINICAL DATA:  History of  fall with injury to the right side of the head.  EXAM: CT HEAD WITHOUT CONTRAST  TECHNIQUE: Contiguous axial images were obtained from the base of the skull through the vertex without intravenous contrast.  COMPARISON:  No priors.  FINDINGS: Well-defined focus of low attenuation in the right thalamus compatible with an old lacunar infarction. No acute displaced skull fractures are identified. No acute intracranial abnormality. Specifically, no evidence of acute post-traumatic intracranial hemorrhage, no definite regions of acute/subacute cerebral ischemia, no focal mass, mass effect, hydrocephalus or abnormal intra or extra-axial fluid collections. The visualized paranasal sinuses and mastoids are well pneumatized.  IMPRESSION: 1. No acute displaced skull fractures or acute intracranial abnormalities. 2. Old lacunar infarct in the right thalamus incidentally noted.   Electronically Signed   By: Vinnie Langton M.D.   On: 08/12/2013 18:36   Ct Cervical Spine Wo Contrast  08/12/2013   CLINICAL DATA:  Fall.  Posterior neck pain.  EXAM: CT CERVICAL SPINE WITHOUT CONTRAST  TECHNIQUE: Multidetector CT imaging of the cervical spine was performed without intravenous contrast. Multiplanar CT image reconstructions were also generated.  COMPARISON:  None.  FINDINGS: Straightening of the normal cervical lordosis. There is calcified pannus at the transverse ligament of the atlas and severe degenerative changes affecting the dens. Cystic changes are present, greater on the left than right, with posterior erosions. There is some cranial settling with remodeling of the clivus which  now articulates with the degenerated odontoid and anterior arch of C1. Intracranial atherosclerosis is noted. Accessory ossicles are present bilaterally dorsal to the occipital condyles. Severe atlanto occipital degenerative disease. There is no cervical spine fracture or dislocation. Multilevel degenerative disc and facet disease. Degenerative disc  disease is most severe from C4-C5 through C6-C7. Disc osteophyte complexes and mild anterolisthesis is present. The anterolisthesis appears facet mediated and degenerative. Carotid atherosclerosis is incidentally noted. Thyroid goiter is present. Dilated proximal thoracic esophagus is present with fluid/fluid level.  IMPRESSION: 1. No acute osseous abnormality in the cervical spine. 2. Moderate to severe degenerative disease involving discs and facets. 3. Severe degenerative changes at the craniocervical junction and atlantoaxial junction. Calcification of the transverse ligament of the atlas. These findings can be associated with inflammatory arthropathy, commonly gout, CPPD arthropathy and rheumatoid arthritis. Favor CPPD arthropathy. 4. Dilated thoracic esophagus, partially visualized.   Electronically Signed   By: Dereck Ligas M.D.   On: 08/12/2013 19:49     Wt Readings from Last 3 Encounters:  03/25/14 141 lb (63.957 kg)  02/24/14 142 lb (64.411 kg)  02/16/14 140 lb (63.504 kg)     Past Medical History  Diagnosis Date  . Coronary artery disease   . Hypertension   . GERD (gastroesophageal reflux disease)   . Shortness of breath     laying down  . Stroke   . Chest pain     Current Outpatient Prescriptions  Medication Sig Dispense Refill  . Acetaminophen 650 MG TABS Take 1,300 mg by mouth 3 (three) times daily.      Marland Kitchen ALPRAZolam (XANAX) 0.25 MG tablet Take 0.25 mg by mouth 2 (two) times daily. As needed for anxiety.      . Calcium Carbonate-Vitamin D (CALCIUM + D) 600-200 MG-UNIT TABS Take 1 tablet by mouth 2 (two) times daily.      . clopidogrel (PLAVIX) 75 MG tablet Take 75 mg by mouth daily after lunch.       . diltiazem (DILACOR XR) 120 MG 24 hr capsule Take 120 mg by mouth daily.      . diphenoxylate-atropine (LOMOTIL) 2.5-0.025 MG per tablet Take 2 tablets by mouth 4 (four) times daily as needed for diarrhea or loose stools.       Marland Kitchen escitalopram (LEXAPRO) 20 MG tablet Take 20  mg by mouth every morning.      . ferrous sulfate 325 (65 FE) MG tablet Take 325 mg by mouth 2 (two) times daily with a meal.      . fexofenadine (ALLEGRA) 180 MG tablet Take 180 mg by mouth daily.      . Lutein 20 MG TABS Take 1 tablet by mouth every morning.      . metoprolol tartrate (LOPRESSOR) 25 MG tablet Take 12.5 mg by mouth 2 (two) times daily.      . Multiple Vitamins-Minerals (CENTRUM SILVER PO) Take 1 tablet by mouth daily at 12 noon.      . nitroGLYCERIN (NITROSTAT) 0.4 MG SL tablet Place 0.4 mg under the tongue every 5 (five) minutes as needed. As needed for chest pain.      . pantoprazole (PROTONIX) 40 MG tablet Take 40 mg by mouth daily.      . ranolazine (RANEXA) 500 MG 12 hr tablet Take 1 tablet (500 mg total) by mouth 2 (two) times daily.  180 tablet  6  . saccharomyces boulardii (FLORASTOR) 250 MG capsule Take 250 mg by mouth 2 (two) times daily.      Marland Kitchen  timolol (TIMOPTIC-XR) 0.5 % ophthalmic gel-forming Place 1 drop into both eyes daily.      . travoprost, benzalkonium, (TRAVATAN) 0.004 % ophthalmic solution Place 1 drop into both eyes at bedtime.      . traZODone (DESYREL) 50 MG tablet Take 150 mg by mouth at bedtime.       No current facility-administered medications for this visit.     Allergies:   Adhesive; Amoxicillin; Simvastatin; and Sulfa antibiotics   Social History:  The patient  reports that she has never smoked. She has never used smokeless tobacco. She reports that she does not drink alcohol or use illicit drugs.   Family History:  The patient's family history includes CAD in her brother, father, mother, and sister; Heart attack in her brother, father, mother, and sister; Hypertension in her sister; Pancreatic cancer in her mother.   ROS:  Please see the history of present illness.      All other systems reviewed and negative.   PHYSICAL EXAM: VS:  BP 145/62  Pulse 66  Ht 5\' 1"  (1.549 m)  Wt 141 lb (63.957 kg)  BMI 26.66 kg/m2 Well nourished, well  developed, in no acute distress HEENT: normal Neck: no JVD Cardiac:  normal S1, S2; RRR; no murmur Lungs:  clear to auscultation bilaterally, no wheezing, rhonchi or rales Abd: soft, nontender, no hepatomegaly Ext: no edema Skin: warm and dry Neuro:  CNs 2-12 intact, no focal abnormalities noted  EKG:  NSR, HR 66, no ST changes     ASSESSMENT AND PLAN:  1.  Coronary artery disease:  She has chronic stable angina.  It is somewhat worse since stopping the nitrates. She has a recent low risk Myoview.  I had a long discussion with the patient and her son.  Would like to avoid cardiac cath given her advanced age.  She is not likely a candidate for CABG.     -  Restart Imdur 120 mg QD.   -  Continue Ranexa. 2.  Essential hypertension:  BP somewhat elevated. Resume Imdur as noted.  3.  Chronic diastolic heart failure:  Volume stable.  Continue current rx.  4.  Carotid stenosis, bilateral:  FU Carotid US in 1 year.   Disposition:  FU with Dr. Daneen Schick or me in 3-4 weeks.    Signed, Versie Starks, MHS 03/25/2014 4:11 PM    [facility] [place], [place], [place]  [address] Phone: [phone]; Fax: [phone]

## 2014-03-25 NOTE — Patient Instructions (Signed)
Your physician has recommended you make the following change in your medication:   1. Restart Imdur 120 mg 1 tablet by mouth daily.   Your physician recommends that you schedule a follow-up appointment in: 3-4 Weeks with Dr. Tamala Julian or with Richardson Dopp PA (while Dr. Tamala Julian is in the office)

## 2014-03-30 DIAGNOSIS — I2510 Atherosclerotic heart disease of native coronary artery without angina pectoris: Secondary | ICD-10-CM | POA: Diagnosis not present

## 2014-03-30 DIAGNOSIS — R0989 Other specified symptoms and signs involving the circulatory and respiratory systems: Secondary | ICD-10-CM | POA: Diagnosis not present

## 2014-03-30 DIAGNOSIS — R0609 Other forms of dyspnea: Secondary | ICD-10-CM | POA: Diagnosis not present

## 2014-03-30 DIAGNOSIS — Z23 Encounter for immunization: Secondary | ICD-10-CM | POA: Diagnosis not present

## 2014-03-30 DIAGNOSIS — I10 Essential (primary) hypertension: Secondary | ICD-10-CM | POA: Diagnosis not present

## 2014-03-30 DIAGNOSIS — R55 Syncope and collapse: Secondary | ICD-10-CM | POA: Diagnosis not present

## 2014-04-20 ENCOUNTER — Ambulatory Visit (INDEPENDENT_AMBULATORY_CARE_PROVIDER_SITE_OTHER)
Admission: RE | Admit: 2014-04-20 | Discharge: 2014-04-20 | Disposition: A | Discharge status: Home / Self Care | Payer: Medicare Other | Source: Ambulatory Visit | Attending: Physician Assistant | Admitting: Physician Assistant

## 2014-04-20 ENCOUNTER — Encounter: Payer: Self-pay | Admitting: Physician Assistant

## 2014-04-20 ENCOUNTER — Ambulatory Visit (INDEPENDENT_AMBULATORY_CARE_PROVIDER_SITE_OTHER): Payer: Medicare Other | Admitting: Physician Assistant

## 2014-04-20 VITALS — BP 151/68 | HR 60 | Ht 61.0 in | Wt 141.0 lb

## 2014-04-20 DIAGNOSIS — I25119 Atherosclerotic heart disease of native coronary artery with unspecified angina pectoris: Secondary | ICD-10-CM | POA: Diagnosis not present

## 2014-04-20 DIAGNOSIS — R519 Headache, unspecified: Secondary | ICD-10-CM

## 2014-04-20 DIAGNOSIS — H539 Unspecified visual disturbance: Secondary | ICD-10-CM

## 2014-04-20 DIAGNOSIS — R51 Headache: Secondary | ICD-10-CM | POA: Diagnosis not present

## 2014-04-20 DIAGNOSIS — R2689 Other abnormalities of gait and mobility: Secondary | ICD-10-CM

## 2014-04-20 DIAGNOSIS — I5032 Chronic diastolic (congestive) heart failure: Secondary | ICD-10-CM | POA: Diagnosis not present

## 2014-04-20 DIAGNOSIS — I6523 Occlusion and stenosis of bilateral carotid arteries: Secondary | ICD-10-CM

## 2014-04-20 DIAGNOSIS — I10 Essential (primary) hypertension: Secondary | ICD-10-CM

## 2014-04-20 DIAGNOSIS — I209 Angina pectoris, unspecified: Secondary | ICD-10-CM | POA: Diagnosis not present

## 2014-04-20 DIAGNOSIS — R531 Weakness: Secondary | ICD-10-CM

## 2014-04-20 DIAGNOSIS — I6529 Occlusion and stenosis of unspecified carotid artery: Secondary | ICD-10-CM | POA: Diagnosis not present

## 2014-04-20 DIAGNOSIS — R29818 Other symptoms and signs involving the nervous system: Secondary | ICD-10-CM

## 2014-04-20 DIAGNOSIS — G43109 Migraine with aura, not intractable, without status migrainosus: Secondary | ICD-10-CM | POA: Diagnosis not present

## 2014-04-20 DIAGNOSIS — H3531 Nonexudative age-related macular degeneration: Secondary | ICD-10-CM | POA: Diagnosis not present

## 2014-04-20 MED ORDER — ASPIRIN EC 81 MG PO TBEC: 81.0000 mg | DELAYED_RELEASE_TABLET | Freq: Every day | ORAL | Status: DC

## 2014-04-20 NOTE — Progress Notes (Signed)
Cardiology Office Note    Date:  04/20/2014   ID:  Ellen Marshall, DOB 1926-05-09, MRN [id number]  PCP:  Henrine Screws, MD  Cardiologist:  Dr. Daneen Schick    History of Present Illness: Ellen Marshall is a 80 y.o. female with a hx of CAD ([facility] done in [place] in ~ 2005 >> PCI not required; prior hx of nuclear study with apical ischemia in 2005), chronic angina, HTN, prior CVAs, diastolic CHF.  She was seen recently by Ermalinda Barrios, PA-C with increasing angina.  She was set up for a [employer] which demonstrated no ischemia.  She was placed on Ranexa (Imdur was stopped).  I saw her in FU 03/25/14 and she had worsening chest pain since stopping Imdur.  I resumed her Imdur.  She returns for FU.    Chest pain is much improved. She probably only uses nitroglycerin twice a week now. She denies significant changes in dyspnea. She sleeps on 2 pillows. She denies PND or edema. She denies syncope.  She has noted several episodes of visual changes. She does have macular degeneration. She denies true amaurosis fugax. She does have left-sided weakness. This is fairly chronic without significant change. She had 2 episodes of what appeared to be yellow vision. One episode she describes as "blank.".   Studies:  - Echo (1/15):  EF 60-65%, no RWMA, Gr 1 DD, MAC  - Nuclear (8/15):  Apical defect (?soft tissue attenuation), EF 69%, no ischemia, low risk  - Carotid US (8/15):  Bilateral ICA 40-59% >> FU 1 year   Recent Labs/Images: 08/12/2013: TSH 3.341  08/13/2013: ALT 7  10/13/2013: Creatinine 1.1; Hemoglobin 13.0; Potassium 4.2     Wt Readings from Last 3 Encounters:  03/25/14 141 lb (63.957 kg)  02/24/14 142 lb (64.411 kg)  02/16/14 140 lb (63.504 kg)     Past Medical History  Diagnosis Date  . Coronary artery disease   . Hypertension   . GERD (gastroesophageal reflux disease)   . Shortness of breath     laying down  . Stroke   . Chest pain     Current Outpatient Prescriptions   Medication Sig Dispense Refill  . Acetaminophen 650 MG TABS Take 1,300 mg by mouth 3 (three) times daily.      Marland Kitchen ALPRAZolam (XANAX) 0.25 MG tablet Take 0.25 mg by mouth 2 (two) times daily. As needed for anxiety.      . Calcium Carbonate-Vitamin D (CALCIUM + D) 600-200 MG-UNIT TABS Take 1 tablet by mouth 2 (two) times daily.      . clopidogrel (PLAVIX) 75 MG tablet Take 75 mg by mouth daily after lunch.       . diltiazem (DILACOR XR) 120 MG 24 hr capsule Take 120 mg by mouth daily.      . diphenoxylate-atropine (LOMOTIL) 2.5-0.025 MG per tablet Take 2 tablets by mouth 4 (four) times daily as needed for diarrhea or loose stools.       Marland Kitchen escitalopram (LEXAPRO) 20 MG tablet Take 20 mg by mouth every morning.      . ferrous sulfate 325 (65 FE) MG tablet Take 325 mg by mouth 2 (two) times daily with a meal.      . fexofenadine (ALLEGRA) 180 MG tablet Take 180 mg by mouth daily.      . isosorbide mononitrate (IMDUR) 120 MG 24 hr tablet Take 1 tablet (120 mg total) by mouth daily.  90 tablet  2  . Lutein 20 MG  TABS Take 1 tablet by mouth every morning.      . metoprolol tartrate (LOPRESSOR) 25 MG tablet Take 12.5 mg by mouth 2 (two) times daily.      . Multiple Vitamins-Minerals (CENTRUM SILVER PO) Take 1 tablet by mouth daily at 12 noon.      . nitroGLYCERIN (NITROSTAT) 0.4 MG SL tablet Place 0.4 mg under the tongue every 5 (five) minutes as needed. As needed for chest pain.      . pantoprazole (PROTONIX) 40 MG tablet Take 40 mg by mouth daily.      . ranolazine (RANEXA) 500 MG 12 hr tablet Take 1 tablet (500 mg total) by mouth 2 (two) times daily.  180 tablet  6  . saccharomyces boulardii (FLORASTOR) 250 MG capsule Take 250 mg by mouth 2 (two) times daily.      . timolol (TIMOPTIC-XR) 0.5 % ophthalmic gel-forming Place 1 drop into both eyes daily.      . travoprost, benzalkonium, (TRAVATAN) 0.004 % ophthalmic solution Place 1 drop into both eyes at bedtime.      . traZODone (DESYREL) 50 MG tablet  Take 150 mg by mouth at bedtime.       No current facility-administered medications for this visit.     Allergies:   Adhesive; Amoxicillin; Simvastatin; and Sulfa antibiotics   Social History:  The patient  reports that she has never smoked. She has never used smokeless tobacco. She reports that she does not drink alcohol or use illicit drugs.   Family History:  The patient's family history includes CAD in her brother, father, mother, and sister; Heart attack in her brother, father, mother, and sister; Hypertension in her sister; Pancreatic cancer in her mother.   ROS:  Please see the history of present illness.      All other systems reviewed and negative.    PHYSICAL EXAM: VS:  BP 151/68  Pulse 60  Ht 5\' 1"  (1.549 m)  Wt 141 lb (63.957 kg)  BMI 26.66 kg/m2 Well nourished, well developed, in no acute distress HEENT: normal Neck: no JVD Cardiac:  normal S1, S2; RRR; no murmur Lungs:  clear to auscultation bilaterally, no wheezing, rhonchi or rales Abd: soft, nontender, no hepatomegaly Ext: no edema Skin: warm and dry MSK:  Right shoulder pain with strength testing Neuro:  CNs 2-12 intact, no focal abnormalities noted   EKG:  NSR, HR 60, normal axis, PVC, nonspecific ST-T wave changes   ASSESSMENT AND PLAN:  1.  Coronary artery disease:  Stable angina. We discussed increasing ranolazine. She prefers to continue current therapy.  2.  Essential hypertension:  Somewhat elevated today. Continue to monitor.  3.  Chronic diastolic heart failure:   Volume stable.  Continue current rx.   4.  Carotid stenosis, bilateral:   FU Carotid US in 1 year.   5.  Visual changes:  I suspect this is all related to macular degeneration. She has symptoms of right-sided weakness as well as balance issues. She does have carotid stenosis. However, this is not severe. I will obtain a head CT today. I have set up a follow up appointment with her ophthalmologist this afternoon for further evaluation.  If there is no contraindication from an ophthalmological standpoint, I have asked the patient to start on aspirin in addition to her Plavix.  6.   Balance disorder: Head CT will be obtained as noted above. I have asked her to have close follow up with her primary care physician for further  evaluation.  I considered sending her to a neurologist given her eye symptoms, etc.  However, I would rather she FU with her PCP first.    Disposition:  FU with Dr. Daneen Schick 2 mos.    Signed, Versie Starks, MHS 04/20/2014 8:13 AM    [facility] [place], [place], [place]  [address] Phone: [phone]; Fax: [phone]

## 2014-04-20 NOTE — Patient Instructions (Addendum)
PER SCOTT WEAVER, PAC YOU NEED TO SEE DR. FONTAINE DUE TO VISUAL CHANGES. YOU HAVE AN APPT TODAY  04/20/14 2:30 .  Your physician recommends that you schedule a follow-up appointment in: 6-8 [place] TO SCHEDULE A HEAD CT W/O CONTRAST; DX HEADACHES, VISUAL CHANGES, WEAKNESS; TODAY OR TOMORROW  YOU WILL NEED TO FOLLOW UP WITH DR. Inda Merlin  START ASPRIN 81 MG DAILY; ONLY IF THE EYE DR SAYS IT IS OK

## 2014-04-21 ENCOUNTER — Telehealth: Payer: Self-pay | Admitting: *Deleted

## 2014-04-21 NOTE — Telephone Encounter (Signed)
s/w pt's son who is caretaker for pt about Head CT results, looks ok no acute findings. Son verbalized understanding to results given today.

## 2014-05-01 DIAGNOSIS — J209 Acute bronchitis, unspecified: Secondary | ICD-10-CM | POA: Diagnosis not present

## 2014-06-01 ENCOUNTER — Ambulatory Visit (INDEPENDENT_AMBULATORY_CARE_PROVIDER_SITE_OTHER): Payer: Medicare Other | Admitting: Interventional Cardiology

## 2014-06-01 ENCOUNTER — Ambulatory Visit: Payer: Medicare Other | Admitting: Interventional Cardiology

## 2014-06-01 ENCOUNTER — Telehealth: Payer: Self-pay

## 2014-06-01 ENCOUNTER — Encounter: Payer: Self-pay | Admitting: Interventional Cardiology

## 2014-06-01 VITALS — BP 154/60 | HR 67 | Ht 61.0 in | Wt 141.0 lb

## 2014-06-01 DIAGNOSIS — I25119 Atherosclerotic heart disease of native coronary artery with unspecified angina pectoris: Secondary | ICD-10-CM | POA: Diagnosis not present

## 2014-06-01 DIAGNOSIS — I5032 Chronic diastolic (congestive) heart failure: Secondary | ICD-10-CM | POA: Diagnosis not present

## 2014-06-01 DIAGNOSIS — I10 Essential (primary) hypertension: Secondary | ICD-10-CM | POA: Diagnosis not present

## 2014-06-01 DIAGNOSIS — R0989 Other specified symptoms and signs involving the circulatory and respiratory systems: Secondary | ICD-10-CM

## 2014-06-01 DIAGNOSIS — I6529 Occlusion and stenosis of unspecified carotid artery: Secondary | ICD-10-CM | POA: Diagnosis not present

## 2014-06-01 NOTE — Patient Instructions (Signed)
Your physician has recommended you make the following change in your medication:  1) INCREASE Ranexa to 1000mg  twice daily. You have been provided 1 week of samples today Please call the office in 5-7 days with an update of symptoms.  Take all other medications as prescribed  Your physician recommends that you schedule a follow-up appointment in: 9 months/ or as needed

## 2014-06-01 NOTE — Telephone Encounter (Signed)
Pt aware appt today with Dr.Smith has been moved up to 4:15pm

## 2014-06-01 NOTE — Progress Notes (Signed)
Patient ID: Ellen Marshall, female   DOB: May 12, 1926, 80 y.o.   MRN: [id number]    [address], [address] [place], [place]  [address] Phone: [phone] Fax:  [phone]  Date:  06/01/2014   ID:  Ellen Marshall, DOB Oct 22, 1925, MRN [id number]  PCP:  Henrine Screws, MD   ASSESSMENT:  1. Angina pectoris responsive to nitroglycerin 2. Recent normal myocardial perfusion study 3. Essential hypertension, controlled  PLAN:  1. Increase ranolazine to 1000 mg twice daily. Call with results of therapy impact on angina in 7 days 2. Clinical follow-up in 7-9 months unless angina increases 3. Nitroglycerin use is encouraged   SUBJECTIVE: Ellen Marshall is a 80 y.o. female who complains of left arm discomfort and intermittent chest discomfort. She has not had syncope. She denies edema. No transient neurological complaints. Nitroglycerin usually relieves discomfort when she has it. Her discomfort now occurs predominately post prandial of supper. No discomfort today.   Wt Readings from Last 3 Encounters:  06/01/14 141 lb (63.957 kg)  04/20/14 141 lb (63.957 kg)  03/25/14 141 lb (63.957 kg)     Past Medical History  Diagnosis Date  . Coronary artery disease   . Hypertension   . GERD (gastroesophageal reflux disease)   . Shortness of breath     laying down  . Stroke   . Chest pain     Current Outpatient Prescriptions  Medication Sig Dispense Refill  . Acetaminophen 650 MG TABS Take 1,300 mg by mouth 3 (three) times daily.    Marland Kitchen ALPRAZolam (XANAX) 0.25 MG tablet Take 0.25 mg by mouth 2 (two) times daily. As needed for anxiety.    Marland Kitchen aspirin EC 81 MG tablet Take 1 tablet (81 mg total) by mouth daily.    . Calcium Carbonate-Vitamin D (CALCIUM + D) 600-200 MG-UNIT TABS Take 1 tablet by mouth 2 (two) times daily.    . clopidogrel (PLAVIX) 75 MG tablet Take 75 mg by mouth daily after lunch.     . diltiazem (DILACOR XR) 120 MG 24 hr capsule Take 120 mg by mouth daily.    .  diphenoxylate-atropine (LOMOTIL) 2.5-0.025 MG per tablet Take 2 tablets by mouth 4 (four) times daily as needed for diarrhea or loose stools.     Marland Kitchen escitalopram (LEXAPRO) 20 MG tablet Take 20 mg by mouth every morning.    . ferrous sulfate 325 (65 FE) MG tablet Take 325 mg by mouth 2 (two) times daily with a meal.    . fexofenadine (ALLEGRA) 180 MG tablet Take 180 mg by mouth daily.    . isosorbide mononitrate (IMDUR) 120 MG 24 hr tablet Take 1 tablet (120 mg total) by mouth daily. 90 tablet 2  . Lutein 20 MG TABS Take 1 tablet by mouth every morning.    . metoprolol tartrate (LOPRESSOR) 25 MG tablet Take 12.5 mg by mouth 2 (two) times daily.    . Multiple Vitamins-Minerals (CENTRUM SILVER PO) Take 1 tablet by mouth daily at 12 noon.    . nitroGLYCERIN (NITROSTAT) 0.4 MG SL tablet Place 0.4 mg under the tongue every 5 (five) minutes as needed. As needed for chest pain.    . pantoprazole (PROTONIX) 40 MG tablet Take 40 mg by mouth daily.    . ranolazine (RANEXA) 500 MG 12 hr tablet Take 1 tablet (500 mg total) by mouth 2 (two) times daily. 180 tablet 6  . saccharomyces boulardii (FLORASTOR) 250 MG capsule Take 250 mg by mouth 2 (two) times  daily.    . timolol (TIMOPTIC-XR) 0.5 % ophthalmic gel-forming Place 1 drop into both eyes daily.    . travoprost, benzalkonium, (TRAVATAN) 0.004 % ophthalmic solution Place 1 drop into both eyes at bedtime.    . traZODone (DESYREL) 50 MG tablet Take 150 mg by mouth at bedtime.     No current facility-administered medications for this visit.    Allergies:    Allergies  Allergen Reactions  . Adhesive [Tape] Other (See Comments)    Burning, Itching.  . Amoxicillin Diarrhea  . Simvastatin     Myalgias   . Sulfa Antibiotics Itching    Social History:  The patient  reports that she has never smoked. She has never used smokeless tobacco. She reports that she does not drink alcohol or use illicit drugs.   ROS:  Please see the history of present illness.    Recurring left arm and right arm pain. Not associated with chest discomfort. Denies dyspnea and orthopnea. Several recent road trips with her family without difficulty.   All other systems reviewed and negative.   OBJECTIVE: VS:  BP 154/60 mmHg  Pulse 67  Ht 5\' 1"  (1.549 m)  Wt 141 lb (63.957 kg)  BMI 26.66 kg/m2 Well nourished, well developed, in no acute distress, elderly but preserved HEENT: normal Neck: JVD flat. Carotid bruit absent  Cardiac:  normal S1, S2; RRR; no murmur Lungs:  clear to auscultation bilaterally, no wheezing, rhonchi or rales Abd: soft, nontender, no hepatomegaly Ext: Edema absent. Pulses 2+ Skin: warm and dry Neuro:  CNs 2-12 intact, no focal abnormalities noted  EKG:  Not performed       Signed, Illene Labrador III, MD 06/01/2014 4:37 PM

## 2014-06-09 ENCOUNTER — Telehealth: Payer: Self-pay | Admitting: Interventional Cardiology

## 2014-06-09 MED ORDER — RANOLAZINE ER 1000 MG PO TB12: 1000.0000 mg | ORAL_TABLET | Freq: Two times a day (BID) | ORAL | Status: DC

## 2014-06-09 NOTE — Telephone Encounter (Signed)
New msg   Patient son Arnette Norris calling, states 1000mg  of Ranexa twice a day is working better for patient. [facility] May contact at [phone].

## 2014-06-09 NOTE — Telephone Encounter (Signed)
pt son reports that pt is doing better on the increases dosage of Ranexa.pt son aware Rx sent to pt pharmacy for Ranexa 1000mg  bid, and update fwd to [place]. Phil verbalized understanding.

## 2014-06-09 NOTE — Telephone Encounter (Signed)
thanks

## 2014-07-22 ENCOUNTER — Emergency Department (HOSPITAL_COMMUNITY): Payer: Medicare Other

## 2014-07-22 ENCOUNTER — Emergency Department (HOSPITAL_COMMUNITY)
Admission: EM | Admit: 2014-07-22 | Discharge: 2014-07-22 | Disposition: A | Discharge status: Home / Self Care | Payer: Medicare Other | Attending: Emergency Medicine | Admitting: Emergency Medicine

## 2014-07-22 ENCOUNTER — Encounter (HOSPITAL_COMMUNITY): Payer: Self-pay | Admitting: Emergency Medicine

## 2014-07-22 DIAGNOSIS — R079 Chest pain, unspecified: Secondary | ICD-10-CM | POA: Diagnosis not present

## 2014-07-22 DIAGNOSIS — Z7902 Long term (current) use of antithrombotics/antiplatelets: Secondary | ICD-10-CM | POA: Insufficient documentation

## 2014-07-22 DIAGNOSIS — I10 Essential (primary) hypertension: Secondary | ICD-10-CM | POA: Diagnosis not present

## 2014-07-22 DIAGNOSIS — K219 Gastro-esophageal reflux disease without esophagitis: Secondary | ICD-10-CM | POA: Insufficient documentation

## 2014-07-22 DIAGNOSIS — Z7982 Long term (current) use of aspirin: Secondary | ICD-10-CM | POA: Insufficient documentation

## 2014-07-22 DIAGNOSIS — R531 Weakness: Secondary | ICD-10-CM | POA: Diagnosis not present

## 2014-07-22 DIAGNOSIS — Z8673 Personal history of transient ischemic attack (TIA), and cerebral infarction without residual deficits: Secondary | ICD-10-CM | POA: Insufficient documentation

## 2014-07-22 DIAGNOSIS — I2510 Atherosclerotic heart disease of native coronary artery without angina pectoris: Secondary | ICD-10-CM | POA: Diagnosis not present

## 2014-07-22 DIAGNOSIS — Z880 Allergy status to penicillin: Secondary | ICD-10-CM | POA: Diagnosis not present

## 2014-07-22 DIAGNOSIS — Z79899 Other long term (current) drug therapy: Secondary | ICD-10-CM | POA: Insufficient documentation

## 2014-07-22 LAB — BASIC METABOLIC PANEL
Anion gap: 4 — ABNORMAL LOW (ref 5–15)
BUN: 20 mg/dL (ref 6–23)
CO2: 26 mmol/L (ref 19–32)
CREATININE: 1.08 mg/dL (ref 0.50–1.10)
Calcium: 8.7 mg/dL (ref 8.4–10.5)
Chloride: 101 mEq/L (ref 96–112)
GFR calc non Af Amer: 45 mL/min — ABNORMAL LOW (ref >90)
GFR, EST AFRICAN AMERICAN: 52 mL/min — AB (ref >90)
Glucose, Bld: 107 mg/dL — ABNORMAL HIGH (ref 70–99)
Potassium: 4.2 mmol/L (ref 3.5–5.1)
Sodium: 131 mmol/L — ABNORMAL LOW (ref 135–145)

## 2014-07-22 LAB — CBC WITH DIFFERENTIAL/PLATELET
Basophils Absolute: 0 10*3/uL (ref 0.0–0.1)
Basophils Relative: 0 % (ref 0–1)
EOS PCT: 1 % (ref 0–5)
Eosinophils Absolute: 0.1 10*3/uL (ref 0.0–0.7)
HCT: 36.7 % (ref 36.0–46.0)
Hemoglobin: 12 g/dL (ref 12.0–15.0)
Lymphocytes Relative: 15 % (ref 12–46)
Lymphs Abs: 1.3 10*3/uL (ref 0.7–4.0)
MCH: 32.5 pg (ref 26.0–34.0)
MCHC: 32.7 g/dL (ref 30.0–36.0)
MCV: 99.5 fL (ref 78.0–100.0)
MONO ABS: 0.8 10*3/uL (ref 0.1–1.0)
Monocytes Relative: 9 % (ref 3–12)
NEUTROS ABS: 6.4 10*3/uL (ref 1.7–7.7)
NEUTROS PCT: 75 % (ref 43–77)
Platelets: 426 10*3/uL — ABNORMAL HIGH (ref 150–400)
RBC: 3.69 MIL/uL — ABNORMAL LOW (ref 3.87–5.11)
RDW: 14.4 % (ref 11.5–15.5)
WBC: 8.5 10*3/uL (ref 4.0–10.5)

## 2014-07-22 LAB — URINALYSIS, ROUTINE W REFLEX MICROSCOPIC
Glucose, UA: NEGATIVE mg/dL
Hgb urine dipstick: NEGATIVE
Ketones, ur: NEGATIVE mg/dL
Leukocytes, UA: NEGATIVE
NITRITE: NEGATIVE
PH: 6 (ref 5.0–8.0)
Protein, ur: NEGATIVE mg/dL
Specific Gravity, Urine: 1.018 (ref 1.005–1.030)
Urobilinogen, UA: 0.2 mg/dL (ref 0.0–1.0)

## 2014-07-22 LAB — CBG MONITORING, ED: GLUCOSE-CAPILLARY: 107 mg/dL — AB (ref 70–99)

## 2014-07-22 LAB — TSH: TSH: 1.558 u[IU]/mL (ref 0.350–4.500)

## 2014-07-22 LAB — I-STAT TROPONIN, ED: Troponin i, poc: 0 ng/mL (ref 0.00–0.08)

## 2014-07-22 LAB — MAGNESIUM: MAGNESIUM: 1.9 mg/dL (ref 1.5–2.5)

## 2014-07-22 MED ORDER — SODIUM CHLORIDE 0.9 % IV SOLN
INTRAVENOUS | Status: DC
  Administered 2014-07-22: 14:00:00 via INTRAVENOUS

## 2014-07-22 NOTE — ED Notes (Signed)
Patient ambulated well with assistance. She felt fine and did well.

## 2014-07-22 NOTE — ED Notes (Signed)
Pt reports that she awoke yesterday morning with bilateral lower extremity weakness. Pt states that she has also had bilateral upper arm weakness. Pt states that she has been able to ambulate with a walker as usual. P reports a HA that was worse yesterday and chest tightness. Pt took x1 Nitro and this mildly relieved pain. Pt called Dr. Lorin Mercy today and told pt to come to the ED. Pt reports that she never regained strength. Pt a&ox4, skin warm and dry.

## 2014-07-22 NOTE — Discharge Instructions (Signed)
Weakness Weakness is a lack of strength. It may be felt all over the body (generalized) or in one specific part of the body (focal). Some causes of weakness can be serious. You may need further medical evaluation, especially if you are elderly or you have a history of immunosuppression (such as chemotherapy or HIV), kidney disease, heart disease, or diabetes. CAUSES  Weakness can be caused by many different things, including:  Infection.  Physical exhaustion.  Internal bleeding or other blood loss that results in a lack of red blood cells (anemia).  Dehydration. This cause is more common in elderly people.  Side effects or electrolyte abnormalities from medicines, such as pain medicines or sedatives.  Emotional distress, anxiety, or depression.  Circulation problems, especially severe peripheral arterial disease.  Heart disease, such as rapid atrial fibrillation, bradycardia, or heart failure.  Nervous system disorders, such as Guillain-Barr syndrome, multiple sclerosis, or stroke. DIAGNOSIS  To find the cause of your weakness, your caregiver will take your history and perform a physical exam. Lab tests or X-rays may also be ordered, if needed. TREATMENT  Treatment of weakness depends on the cause of your symptoms and can vary greatly. HOME CARE INSTRUCTIONS   Rest as needed.  Eat a well-balanced diet.  Try to get some exercise every day.  Only take over-the-counter or prescription medicines as directed by your caregiver. SEEK MEDICAL CARE IF:   Your weakness seems to be getting worse or spreads to other parts of your body.  You develop new aches or pains. SEEK IMMEDIATE MEDICAL CARE IF:   You cannot perform your normal daily activities, such as getting dressed and feeding yourself.  You cannot walk up and down stairs, or you feel exhausted when you do so.  You have shortness of breath or chest pain.  You have difficulty moving parts of your body.  You have weakness  in only one area of the body or on only one side of the body.  You have a fever.  You have trouble speaking or swallowing.  You cannot control your bladder or bowel movements.  You have black or bloody vomit or stools. MAKE SURE YOU:  Understand these instructions.  Will watch your condition.  Will get help right away if you are not doing well or get worse. Document Released: 07/03/2005 Document Revised: 01/02/2012 Document Reviewed: 09/01/2011 [facility] Patient Information 2015 [place], [place]. This information is not intended to replace advice given to you by your health care provider. Make sure you discuss any questions you have with your health care provider. Angina Pectoris Angina pectoris, often just called angina, is extreme discomfort in your chest, neck, or arm caused by a lack of blood in the middle and thickest layer of your heart wall (myocardium). It may feel like tightness or heavy pressure. It may feel like a crushing or squeezing pain. Some people say it feels like gas or indigestion. It may go down your shoulders, back, and arms. Some people may have symptoms other than pain. These symptoms include fatigue, shortness of breath, cold sweats, or nausea. There are four different types of angina:  Stable angina--Stable angina usually occurs in episodes of predictable frequency and duration. It usually is brought on by physical activity, emotional stress, or excitement. These are all times when the myocardium needs more oxygen. Stable angina usually lasts a few minutes and often is relieved by taking a medicine that can be taken under your tongue (sublingually). The medicine is called nitroglycerin. Stable angina is  caused by a buildup of plaque inside the arteries, which restricts blood flow to the heart muscle (atherosclerosis).  Unstable angina--Unstable angina can occur even when your body experiences little or no physical exertion. It can occur during sleep. It can also occur  at rest. It can suddenly increase in severity or frequency. It might not be relieved by sublingual nitroglycerin. It can last up to 30 minutes. The most common cause of unstable angina is a blood clot that has developed on the top of plaque buildup inside a coronary artery. It can lead to a heart attack if the blood clot completely blocks the artery.  Microvascular angina--This type of angina is caused by a disorder of tiny blood vessels called arterioles. Microvascular angina is more common in women. The pain may be more severe and last longer than other types of angina pectoris.  Prinzmetal or variant angina--This type of angina pectoris usually occurs when your body experiences little or no physical exertion. It especially occurs in the early morning hours. It is caused by a spasm of your coronary artery. HOME CARE INSTRUCTIONS   Only take over-the-counter and prescription medicines as directed by your health care provider.  Stay active or increase your exercise as directed by your health care provider.  Limit strenuous activity as directed by your health care provider.  Limit heavy lifting as directed by your health care provider.  Maintain a healthy weight.  Learn about and eat heart-healthy foods.  Do not use any tobacco products including cigarettes, chewing tobacco or electronic cigarettes. SEEK IMMEDIATE MEDICAL CARE IF:  You experience the following symptoms:  Chest, neck, deep shoulder, or arm pain or discomfort that lasts more than a few minutes.  Chest, neck, deep shoulder, or arm pain or discomfort that goes away and comes back, repeatedly.  Heavy sweating with discomfort, without a noticeable cause.  Shortness of breath or difficulty breathing.  Angina that does not get better after a few minutes of rest or after taking sublingual nitroglycerin. These can all be symptoms of a heart attack, which is a medical emergency! Get medical help at once. Call your local emergency  service (911 in [place] immediately. Do not  drive yourself to the hospital and do not  wait to for your symptoms to go away. MAKE SURE YOU:  Understand these instructions.  Will watch your condition.  Will get help right away if you are not doing well or get worse. Document Released: 07/03/2005 Document Revised: 07/08/2013 Document Reviewed: 11/04/2013 [facility] Patient Information 2015 [place], [place]. This information is not intended to replace advice given to you by your health care provider. Make sure you discuss any questions you have with your health care provider.

## 2014-07-22 NOTE — ED Notes (Signed)
Pt reports noticing BLE weakness when attempting to get OOB x 1 day ago.  Sts later in the day she was able to walk and has been able to walk today.  Sts increasing BUE weakness x 1 days ago, but "arms seem to be stronger today."

## 2014-07-22 NOTE — ED Provider Notes (Addendum)
TIME SEEN: 1:15 PM  CHIEF COMPLAINT: Generalized weakness  HPI: Pt is a 81 y.o. F with history of coronary artery disease, hypertension, prior stroke who presents emergency department with generalized weakness that started yesterday. Patient's son reports yesterday she had a difficult time getting out of bed and getting around her house. She states is because her legs felt "rubbery". Denies any numbness or tingling. She did have a headache yesterday but has a history of chronic headaches and states this was no different. Also had chest pain yesterday but states this is also normal for her and has a history of stable angina and is on Ranexa.  Chest pain resolved with nitroglycerin. Denies that she has had any recent head injury. No cough, vomiting or diarrhea. No dysuria or hematuria. No hematochezia. Patient reports she does have black stool but is on iron tablets and states she has this followed by her doctor regularly and has never had a positive Hemoccult. Denies any focal weakness. No new back pain. Has a history of some urinary incontinence that is chronic but no urinary retention or bowel incontinence.  ROS: See HPI Constitutional: no fever  Eyes: no drainage  ENT: no runny nose   Cardiovascular:  no chest pain  Resp: no SOB  GI: no vomiting GU: no dysuria Integumentary: no rash  Allergy: no hives  Musculoskeletal: no leg swelling  Neurological: no slurred speech ROS otherwise negative  PAST MEDICAL HISTORY/PAST SURGICAL HISTORY:  Past Medical History  Diagnosis Date  . Coronary artery disease   . Hypertension   . GERD (gastroesophageal reflux disease)   . Shortness of breath     laying down  . Stroke   . Chest pain     MEDICATIONS:  Prior to Admission medications   Medication Sig Start Date End Date Taking? Authorizing Provider  Acetaminophen 650 MG TABS Take 1,300 mg by mouth 3 (three) times daily.    Historical Provider, MD  ALPRAZolam Duanne Moron) 0.25 MG tablet Take 0.25 mg  by mouth 2 (two) times daily. As needed for anxiety.    Historical Provider, MD  aspirin EC 81 MG tablet Take 1 tablet (81 mg total) by mouth daily. 04/20/14   Liliane Shi, PA-C  Calcium Carbonate-Vitamin D (CALCIUM + D) 600-200 MG-UNIT TABS Take 1 tablet by mouth 2 (two) times daily.    Historical Provider, MD  clopidogrel (PLAVIX) 75 MG tablet Take 75 mg by mouth daily after lunch.     Historical Provider, MD  diltiazem (DILACOR XR) 120 MG 24 hr capsule Take 120 mg by mouth daily.    Historical Provider, MD  diphenoxylate-atropine (LOMOTIL) 2.5-0.025 MG per tablet Take 2 tablets by mouth 4 (four) times daily as needed for diarrhea or loose stools.     Historical Provider, MD  escitalopram (LEXAPRO) 20 MG tablet Take 20 mg by mouth every morning.    Historical Provider, MD  ferrous sulfate 325 (65 FE) MG tablet Take 325 mg by mouth 2 (two) times daily with a meal.    Historical Provider, MD  fexofenadine (ALLEGRA) 180 MG tablet Take 180 mg by mouth daily.    Historical Provider, MD  isosorbide mononitrate (IMDUR) 120 MG 24 hr tablet Take 1 tablet (120 mg total) by mouth daily. 03/25/14   Scott Joylene Draft, PA-C  Lutein 20 MG TABS Take 1 tablet by mouth every morning.    Historical Provider, MD  metoprolol tartrate (LOPRESSOR) 25 MG tablet Take 12.5 mg by mouth 2 (two) times  daily.    Historical Provider, MD  Multiple Vitamins-Minerals (CENTRUM SILVER PO) Take 1 tablet by mouth daily at 12 noon.    Historical Provider, MD  nitroGLYCERIN (NITROSTAT) 0.4 MG SL tablet Place 0.4 mg under the tongue every 5 (five) minutes as needed. As needed for chest pain.    Historical Provider, MD  pantoprazole (PROTONIX) 40 MG tablet Take 40 mg by mouth daily.    Historical Provider, MD  ranolazine (RANEXA) 1000 MG SR tablet Take 1 tablet (1,000 mg total) by mouth 2 (two) times daily. 06/09/14   Belva Crome III, MD  saccharomyces boulardii (FLORASTOR) 250 MG capsule Take 250 mg by mouth 2 (two) times daily.     Historical Provider, MD  timolol (TIMOPTIC-XR) 0.5 % ophthalmic gel-forming Place 1 drop into both eyes daily.    Historical Provider, MD  travoprost, benzalkonium, (TRAVATAN) 0.004 % ophthalmic solution Place 1 drop into both eyes at bedtime.    Historical Provider, MD  traZODone (DESYREL) 50 MG tablet Take 150 mg by mouth at bedtime.    Historical Provider, MD    ALLERGIES:  Allergies  Allergen Reactions  . Adhesive [Tape] Other (See Comments)    Burning, Itching.  . Amoxicillin Diarrhea  . Simvastatin     Myalgias   . Sulfa Antibiotics Itching    SOCIAL HISTORY:  History  Substance Use Topics  . Smoking status: Never Smoker   . Smokeless tobacco: Never Used  . Alcohol Use: No     Comment: none in 20 years - 08/12/13    FAMILY HISTORY: Family History  Problem Relation Age of Onset  . CAD Father   . Pancreatic cancer Mother   . CAD Mother   . CAD Brother   . Hypertension Sister   . CAD Sister   . Heart attack Mother   . Heart attack Father   . Heart attack Sister   . Heart attack Brother     EXAM: BP 158/59 mmHg  Pulse 61  Temp(Src) 97.5 F (36.4 C) (Oral)  Resp 16  Ht 5' 3.5" (1.613 m)  Wt 142 lb (64.411 kg)  BMI 24.76 kg/m2  SpO2 100% CONSTITUTIONAL: Alert and oriented and responds appropriately to questions. Well-appearing; well-nourished HEAD: Normocephalic EYES: Conjunctivae clear, PERRL ENT: normal nose; no rhinorrhea; moist mucous membranes; pharynx without lesions noted NECK: Supple, no meningismus, no LAD  CARD: RRR; S1 and S2 appreciated; no murmurs, no clicks, no rubs, no gallops RESP: Normal chest excursion without splinting or tachypnea; breath sounds clear and equal bilaterally; no wheezes, no rhonchi, no rales, no hypoxia or respiratory distress ABD/GI: Normal bowel sounds; non-distended; soft, non-tender, no rebound, no guarding BACK:  The back appears normal and is non-tender to palpation, there is no CVA tenderness EXT: Normal ROM in all  joints; non-tender to palpation; no edema; normal capillary refill; no cyanosis; compartments soft, no joint effusion, no calf tenderness or swelling    SKIN: Normal color for age and race; warm NEURO: Moves all extremities equally; strength 5/5 in all 4 extremities, sensation to light touch intact diffusely, cranial nerves II through XII intact, patient is able to ambulate with very minimal assistance walking forward and backwards without difficulty, no ataxic gait PSYCH: The patient's mood and manner are appropriate. Grooming and personal hygiene are appropriate.  MEDICAL DECISION MAKING: Patient here with generalized weakness. Differential diagnosis is large but she is very well-appearing, nontoxic and neurologically intact. Doubt CVA or intracranial hemorrhage. Denies any pain currently. No  new back pain. No neurologic deficit on exam. No urinary retention or bowel incontinence. She has chronic urinary incontinence. Doubt cauda equina, spinal stenosis or other spinal pathology. We'll obtain labs including troponin, TSH, chest x-ray and urinalysis to evaluate for infection, give IV fluids and reassess.  ED PROGRESS: Patient's labs and urine are unremarkable. Troponin negative. EKG shows no new ischemic changes. Last episode of chest pain was yesterday. She is still hemodynamically stable. Was able to ambulate using her walker without difficulty. I feel she is safe to be discharged home. Son at bedside agrees. Discussed return precautions and importance of PCP follow-up. They verbalize understanding and are comfortable with plan.      EKG Interpretation  Date/Time:  Wednesday July 22 2014 13:03:34 EST Ventricular Rate:  61 PR Interval:  256 QRS Duration: 88 QT Interval:  442 QTC Calculation: 445 R Axis:   -10 Text Interpretation:  Sinus rhythm First degree A-V block Anteroseptal infarct, age indeterminate Baseline wander in lead(s) II aVR aVF V3 No significant change since last tracing  Confirmed by Kenna Gilbert, KRISTEN [phone]) on 07/22/2014 1:15:52 PM         EKG Interpretation  Date/Time:  Wednesday July 22 2014 14:05:24 EST Ventricular Rate:  58 PR Interval:  206 QRS Duration: 80 QT Interval:  442 QTC Calculation: 434 R Axis:   -19 Text Interpretation:  Sinus rhythm Borderline left axis deviation Anteroseptal infarct, age indeterminate Baseline wander in lead(s) V2 No significant change since last tracing Confirmed by WARD,  DO, KRISTEN [phone]) on 07/22/2014 2:08:26 PM        [place], DO 07/22/14 [place], DO 07/22/14 [id number]

## 2014-07-30 ENCOUNTER — Encounter (HOSPITAL_COMMUNITY): Payer: Self-pay | Admitting: Emergency Medicine

## 2014-07-30 ENCOUNTER — Emergency Department (HOSPITAL_COMMUNITY)
Admission: EM | Admit: 2014-07-30 | Discharge: 2014-07-30 | Disposition: A | Discharge status: Home / Self Care | Payer: Medicare Other | Attending: Emergency Medicine | Admitting: Emergency Medicine

## 2014-07-30 ENCOUNTER — Emergency Department (HOSPITAL_COMMUNITY): Payer: Medicare Other

## 2014-07-30 DIAGNOSIS — M25571 Pain in right ankle and joints of right foot: Secondary | ICD-10-CM | POA: Insufficient documentation

## 2014-07-30 DIAGNOSIS — Z880 Allergy status to penicillin: Secondary | ICD-10-CM | POA: Insufficient documentation

## 2014-07-30 DIAGNOSIS — Z7902 Long term (current) use of antithrombotics/antiplatelets: Secondary | ICD-10-CM | POA: Diagnosis not present

## 2014-07-30 DIAGNOSIS — R200 Anesthesia of skin: Secondary | ICD-10-CM | POA: Diagnosis not present

## 2014-07-30 DIAGNOSIS — M25572 Pain in left ankle and joints of left foot: Secondary | ICD-10-CM | POA: Diagnosis not present

## 2014-07-30 DIAGNOSIS — R51 Headache: Secondary | ICD-10-CM | POA: Diagnosis not present

## 2014-07-30 DIAGNOSIS — K219 Gastro-esophageal reflux disease without esophagitis: Secondary | ICD-10-CM | POA: Insufficient documentation

## 2014-07-30 DIAGNOSIS — M79601 Pain in right arm: Secondary | ICD-10-CM | POA: Diagnosis not present

## 2014-07-30 DIAGNOSIS — I7090 Unspecified atherosclerosis: Secondary | ICD-10-CM | POA: Diagnosis not present

## 2014-07-30 DIAGNOSIS — M79602 Pain in left arm: Secondary | ICD-10-CM | POA: Insufficient documentation

## 2014-07-30 DIAGNOSIS — R404 Transient alteration of awareness: Secondary | ICD-10-CM | POA: Diagnosis not present

## 2014-07-30 DIAGNOSIS — M7989 Other specified soft tissue disorders: Secondary | ICD-10-CM | POA: Diagnosis not present

## 2014-07-30 DIAGNOSIS — R531 Weakness: Secondary | ICD-10-CM | POA: Diagnosis not present

## 2014-07-30 DIAGNOSIS — Z7982 Long term (current) use of aspirin: Secondary | ICD-10-CM | POA: Insufficient documentation

## 2014-07-30 DIAGNOSIS — R110 Nausea: Secondary | ICD-10-CM | POA: Diagnosis not present

## 2014-07-30 DIAGNOSIS — Z79899 Other long term (current) drug therapy: Secondary | ICD-10-CM | POA: Diagnosis not present

## 2014-07-30 DIAGNOSIS — R52 Pain, unspecified: Secondary | ICD-10-CM

## 2014-07-30 DIAGNOSIS — N390 Urinary tract infection, site not specified: Secondary | ICD-10-CM | POA: Diagnosis not present

## 2014-07-30 DIAGNOSIS — I10 Essential (primary) hypertension: Secondary | ICD-10-CM | POA: Insufficient documentation

## 2014-07-30 DIAGNOSIS — Z8673 Personal history of transient ischemic attack (TIA), and cerebral infarction without residual deficits: Secondary | ICD-10-CM | POA: Diagnosis not present

## 2014-07-30 DIAGNOSIS — R5383 Other fatigue: Secondary | ICD-10-CM | POA: Diagnosis not present

## 2014-07-30 DIAGNOSIS — I2510 Atherosclerotic heart disease of native coronary artery without angina pectoris: Secondary | ICD-10-CM | POA: Diagnosis not present

## 2014-07-30 LAB — CBC WITH DIFFERENTIAL/PLATELET
BASOS PCT: 0 % (ref 0–1)
Basophils Absolute: 0 10*3/uL (ref 0.0–0.1)
EOS PCT: 0 % (ref 0–5)
Eosinophils Absolute: 0 10*3/uL (ref 0.0–0.7)
HCT: 34.4 % — ABNORMAL LOW (ref 36.0–46.0)
Hemoglobin: 11.5 g/dL — ABNORMAL LOW (ref 12.0–15.0)
LYMPHS ABS: 1.2 10*3/uL (ref 0.7–4.0)
LYMPHS PCT: 10 % — AB (ref 12–46)
MCH: 32.1 pg (ref 26.0–34.0)
MCHC: 33.4 g/dL (ref 30.0–36.0)
MCV: 96.1 fL (ref 78.0–100.0)
Monocytes Absolute: 0.5 10*3/uL (ref 0.1–1.0)
Monocytes Relative: 5 % (ref 3–12)
Neutro Abs: 10.1 10*3/uL — ABNORMAL HIGH (ref 1.7–7.7)
Neutrophils Relative %: 85 % — ABNORMAL HIGH (ref 43–77)
PLATELETS: 428 10*3/uL — AB (ref 150–400)
RBC: 3.58 MIL/uL — ABNORMAL LOW (ref 3.87–5.11)
RDW: 14.1 % (ref 11.5–15.5)
WBC: 11.9 10*3/uL — AB (ref 4.0–10.5)

## 2014-07-30 LAB — URINALYSIS, ROUTINE W REFLEX MICROSCOPIC
Bilirubin Urine: NEGATIVE
Glucose, UA: NEGATIVE mg/dL
Hgb urine dipstick: NEGATIVE
Ketones, ur: NEGATIVE mg/dL
Nitrite: NEGATIVE
PROTEIN: NEGATIVE mg/dL
Specific Gravity, Urine: 1.012 (ref 1.005–1.030)
Urobilinogen, UA: 0.2 mg/dL (ref 0.0–1.0)
pH: 6 (ref 5.0–8.0)

## 2014-07-30 LAB — COMPREHENSIVE METABOLIC PANEL
ALBUMIN: 3.5 g/dL (ref 3.5–5.2)
ALT: 15 U/L (ref 0–35)
ANION GAP: 13 (ref 5–15)
AST: 19 U/L (ref 0–37)
Alkaline Phosphatase: 45 U/L (ref 39–117)
BUN: 12 mg/dL (ref 6–23)
CO2: 24 mmol/L (ref 19–32)
Calcium: 9.4 mg/dL (ref 8.4–10.5)
Chloride: 98 mEq/L (ref 96–112)
Creatinine, Ser: 0.99 mg/dL (ref 0.50–1.10)
GFR calc Af Amer: 58 mL/min — ABNORMAL LOW (ref >90)
GFR, EST NON AFRICAN AMERICAN: 50 mL/min — AB (ref >90)
GLUCOSE: 109 mg/dL — AB (ref 70–99)
Potassium: 3.9 mmol/L (ref 3.5–5.1)
Sodium: 135 mmol/L (ref 135–145)
Total Bilirubin: 0.5 mg/dL (ref 0.3–1.2)
Total Protein: 6.4 g/dL (ref 6.0–8.3)

## 2014-07-30 LAB — I-STAT TROPONIN, ED: TROPONIN I, POC: 0 ng/mL (ref 0.00–0.08)

## 2014-07-30 LAB — LIPASE, BLOOD: Lipase: 30 U/L (ref 11–59)

## 2014-07-30 LAB — URINE MICROSCOPIC-ADD ON

## 2014-07-30 MED ORDER — CEPHALEXIN 500 MG PO CAPS: 500.0000 mg | ORAL_CAPSULE | Freq: Three times a day (TID) | ORAL | Status: DC

## 2014-07-30 MED ORDER — SODIUM CHLORIDE 0.9 % IV BOLUS (SEPSIS)
500.0000 mL | Freq: Once | INTRAVENOUS | Status: AC
  Administered 2014-07-30: 500 mL via INTRAVENOUS

## 2014-07-30 NOTE — ED Notes (Signed)
Pt brought by [facility] from home for c/o weakness on bilateral legs. Pt states she try to get out off bed and she wasn't able to stand up or walk. "feels like my legs give up on me" pt denies any pain or discomfort, no distress noticed. VS by EMS  HR 60, R 19, BP 170/70, 91% RA.

## 2014-07-30 NOTE — ED Provider Notes (Signed)
CSN: [id number]     Arrival date & time    History   First MD Initiated Contact with Patient 07/30/14 [id number]     Chief Complaint  Patient presents with  . Extremity Weakness   HPI  Patient is an 81 year old female with past medical history of coronary artery disease, hypertension, GERD, stroke who presents emergency room for evaluation of upper and lower extremity weakness. Patient states that she woke up at 2 AM this morning with a roaring sensation in both of her ears. She states that she went to sit up in her arms felt very rubbery and weak and would not support her. She struggled to sit up on her own. She also states that her legs felt very weak and rubbery as well. She wanted to go to the bathroom but felt that she was not strong enough to walk herself to the bathroom. Patient is presenting from an independent living facility. Her son reports that she had a very severe headache yesterday and pain in bilateral arms. Patient states that the pain was in her right temple and was a sharp stabbing sensation. She denies pain with chewing or with brushing her hair. She does not have any tenderness at this time. She states that the burning in her ears has resolved. At this time she only complains of being tired and sleepy. She does admit some mild nausea this morning. Patient is followed by Dr. Mertha Finders from [place].  Past Medical History  Diagnosis Date  . Coronary artery disease   . Hypertension   . GERD (gastroesophageal reflux disease)   . Shortness of breath     laying down  . Stroke   . Chest pain    Past Surgical History  Procedure Laterality Date  . Colonscopy    . Esophagogastroduodenoscopy    . Stretching of egd    . Abdominal hysterectomy    . Esophagogastroduodenoscopy  10/10/2011    Procedure: ESOPHAGOGASTRODUODENOSCOPY (EGD);  Surgeon: Winfield Cunas., MD;  Location: [facility] ENDOSCOPY;  Service: Endoscopy;  Laterality: N/A;  with control of bleeding  . Esophagogastroduodenoscopy   10/12/2011    Procedure: ESOPHAGOGASTRODUODENOSCOPY (EGD);  Surgeon: Winfield Cunas., MD;  Location: [facility] ENDOSCOPY;  Service: Endoscopy;  Laterality: N/A;  . Flexible sigmoidoscopy  10/12/2011    Procedure: FLEXIBLE SIGMOIDOSCOPY;  Surgeon: Winfield Cunas., MD;  Location: [facility] ENDOSCOPY;  Service: Endoscopy;  Laterality: N/A;   Family History  Problem Relation Age of Onset  . CAD Father   . Pancreatic cancer Mother   . CAD Mother   . CAD Brother   . Hypertension Sister   . CAD Sister   . Heart attack Mother   . Heart attack Father   . Heart attack Sister   . Heart attack Brother    History  Substance Use Topics  . Smoking status: Never Smoker   . Smokeless tobacco: Never Used  . Alcohol Use: No     Comment: none in 20 years - 08/12/13   OB History    No data available     Review of Systems  Constitutional: Positive for fatigue. Negative for fever and chills.  Respiratory: Negative for chest tightness and shortness of breath.   Cardiovascular: Negative for chest pain, palpitations and leg swelling.  Gastrointestinal: Positive for nausea. Negative for vomiting, abdominal pain, diarrhea and constipation.  Genitourinary: Negative for dysuria, urgency, enuresis, difficulty urinating and genital sores.  Musculoskeletal: Positive for joint swelling (Bilaterally ankles) and  arthralgias (Bilateral ankles).  Skin: Negative for color change and rash.  Neurological: Positive for numbness and headaches. Negative for dizziness.  Psychiatric/Behavioral: Negative for confusion.  All other systems reviewed and are negative.     Allergies  Adhesive; Amoxicillin; Simvastatin; and Sulfa antibiotics  Home Medications   Prior to Admission medications   Medication Sig Start Date End Date Taking? Authorizing Provider  Acetaminophen 650 MG TABS Take 1,300 mg by mouth 3 (three) times daily.   Yes Historical Provider, MD  ALPRAZolam (XANAX) 0.25 MG tablet Take 0.25 mg by mouth 2 (two)  times daily. As needed for anxiety. Takes at 2pm and bedtime   Yes Historical Provider, MD  aspirin EC 81 MG tablet Take 1 tablet (81 mg total) by mouth daily. 04/20/14  Yes Liliane Shi, PA-C  Calcium Carbonate-Vitamin D (CALCIUM + D) 600-200 MG-UNIT TABS Take 1 tablet by mouth 2 (two) times daily.   Yes Historical Provider, MD  clopidogrel (PLAVIX) 75 MG tablet Take 75 mg by mouth daily after lunch.    Yes Historical Provider, MD  diltiazem (DILACOR XR) 120 MG 24 hr capsule Take 120 mg by mouth daily.   Yes Historical Provider, MD  diphenoxylate-atropine (LOMOTIL) 2.5-0.025 MG per tablet Take 2 tablets by mouth 4 (four) times daily as needed for diarrhea or loose stools.    Yes Historical Provider, MD  escitalopram (LEXAPRO) 20 MG tablet Take 20 mg by mouth every morning.   Yes Historical Provider, MD  ferrous sulfate 325 (65 FE) MG tablet Take 325 mg by mouth 2 (two) times daily with a meal.   Yes Historical Provider, MD  fexofenadine (ALLEGRA) 180 MG tablet Take 180 mg by mouth daily.   Yes Historical Provider, MD  folic acid (FOLVITE) 235 MCG tablet Take 800 mcg by mouth daily.   Yes Historical Provider, MD  isosorbide mononitrate (IMDUR) 120 MG 24 hr tablet Take 1 tablet (120 mg total) by mouth daily. 03/25/14  Yes Scott T Weaver, PA-C  Lutein 20 MG TABS Take 1 tablet by mouth every morning.   Yes Historical Provider, MD  metoprolol tartrate (LOPRESSOR) 25 MG tablet Take 12.5 mg by mouth 2 (two) times daily.   Yes Historical Provider, MD  Multiple Vitamins-Minerals (CENTRUM SILVER PO) Take 1 tablet by mouth daily at 12 noon.   Yes Historical Provider, MD  nitroGLYCERIN (NITROSTAT) 0.4 MG SL tablet Place 0.4 mg under the tongue every 5 (five) minutes as needed. As needed for chest pain.   Yes Historical Provider, MD  pantoprazole (PROTONIX) 40 MG tablet Take 40 mg by mouth daily.   Yes Historical Provider, MD  ranolazine (RANEXA) 1000 MG SR tablet Take 1 tablet (1,000 mg total) by mouth 2 (two)  times daily. 06/09/14  Yes Belva Crome III, MD  saccharomyces boulardii (FLORASTOR) 250 MG capsule Take 250 mg by mouth 2 (two) times daily.   Yes Historical Provider, MD  timolol (TIMOPTIC-XR) 0.5 % ophthalmic gel-forming Place 1 drop into both eyes daily.   Yes Historical Provider, MD  travoprost, benzalkonium, (TRAVATAN) 0.004 % ophthalmic solution Place 1 drop into both eyes at bedtime.   Yes Historical Provider, MD  traZODone (DESYREL) 50 MG tablet Take 150 mg by mouth at bedtime.   Yes Historical Provider, MD  cephALEXin (KEFLEX) 500 MG capsule Take 1 capsule (500 mg total) by mouth 3 (three) times daily. 07/30/14   Carnelius Hammitt A Forcucci, PA-C   BP 172/54 mmHg  Pulse 68  Temp(Src) 97.9 F (  36.6 C) (Oral)  Resp 21  SpO2 99% Physical Exam  Constitutional: She is oriented to person, place, and time. She appears well-developed and well-nourished. No distress.  HENT:  Head: Normocephalic and atraumatic.  Mouth/Throat: Oropharynx is clear and moist. No oropharyngeal exudate.  Eyes: Conjunctivae and EOM are normal. Pupils are equal, round, and reactive to light. No scleral icterus.  Neck: Normal range of motion. Neck supple. No JVD present. No thyromegaly present.  Cardiovascular: Normal rate, regular rhythm, normal heart sounds and intact distal pulses.  Exam reveals no gallop and no friction rub.   No murmur heard. Pulmonary/Chest: Effort normal and breath sounds normal. No respiratory distress. She has no wheezes. She has no rales. She exhibits no tenderness.  Abdominal: Soft. Bowel sounds are normal. She exhibits no distension and no mass. There is no tenderness. There is no rebound and no guarding.  Musculoskeletal: Normal range of motion.  Lymphadenopathy:    She has no cervical adenopathy.  Neurological: She is alert and oriented to person, place, and time. She has normal strength. No cranial nerve deficit or sensory deficit. Coordination normal.  Skin: Skin is warm and dry. She is  not diaphoretic.  Psychiatric: She has a normal mood and affect. Her behavior is normal. Judgment and thought content normal.  Nursing note and vitals reviewed.   ED Course  Procedures (including critical care time) Labs Review Labs Reviewed  CBC WITH DIFFERENTIAL - Abnormal; Notable for the following:    WBC 11.9 (*)    RBC 3.58 (*)    Hemoglobin 11.5 (*)    HCT 34.4 (*)    Platelets 428 (*)    Neutrophils Relative % 85 (*)    Neutro Abs 10.1 (*)    Lymphocytes Relative 10 (*)    All other components within normal limits  COMPREHENSIVE METABOLIC PANEL - Abnormal; Notable for the following:    Glucose, Bld 109 (*)    GFR calc non Af Amer 50 (*)    GFR calc Af Amer 58 (*)    All other components within normal limits  URINALYSIS, ROUTINE W REFLEX MICROSCOPIC - Abnormal; Notable for the following:    Leukocytes, UA TRACE (*)    All other components within normal limits  URINE MICROSCOPIC-ADD ON - Abnormal; Notable for the following:    Casts HYALINE CASTS (*)    All other components within normal limits  URINE CULTURE  LIPASE, BLOOD  I-STAT TROPOININ, ED    Imaging Review Ct Head Wo Contrast  07/30/2014   CLINICAL DATA:  Sudden extremity weakness. Unable to sit or stand earlier today. Remote history of stroke. Severe right-sided headache over the last 24 hr.  EXAM: CT HEAD WITHOUT CONTRAST  TECHNIQUE: Contiguous axial images were obtained from the base of the skull through the vertex without intravenous contrast.  COMPARISON:  None.  FINDINGS: Remote medial cerebellar infarcts scratch the remote medial flank infarcts are stable. There is also a remote infarct of the peripheral right cerebellum. No acute cortical infarct, hemorrhage, or mass lesion is present. The ventricles are of normal size. No significant extraaxial fluid collection is present.  The paranasal sinuses and mastoid air cells are clear. Atherosclerotic calcifications are evident. The calvarium is intact.  IMPRESSION:  1. Stable remote infarcts of the thalami and right cerebellum. 2. No acute intracranial abnormality or significant interval change. 3. Atherosclerosis.   Electronically Signed   By: Lawrence Santiago M.D.   On: 07/30/2014 12:39     EKG  Interpretation   Date/Time:  Thursday July 30 2014 05:10:54 EST Ventricular Rate:  59 PR Interval:  222 QRS Duration: 84 QT Interval:  444 QTC Calculation: 440 R Axis:   -7 Text Interpretation:  Sinus rhythm Ventricular premature complex Prolonged  PR interval Anterior infarct, old Minimal ST depression, anterolateral  leads Confirmed by OTTER  MD, OLGA ([id number]) on 07/30/2014 5:15:16 AM      MDM   Final diagnoses:  Pain  UTI (lower urinary tract infection)  Weakness   Patient's an 81 year old female who presents emergency room for evaluation of weakness. Physical exam reveals no appreciable weakness in the upper or lower extremities. Strength is symmetric and equal. There are no focal neurological deficits on examination. CBC reveals mild leukocytosis. CMP is unremarkable. Lipase is negative. I-STAT troponin is negative. EKG reveals no acute changes. Urinalysis shows UTI. Urine culture sent. CT of the head was ordered given history of recent falls obtained by Dr. Kathrynn Humble. CT of the head reveals no evidence for acute abnormalities. There are old remote infarcts. Suspect that weakness may be due to urinary tract infection. Patient was able to ambulate here in the emergency room with no issues. She states that she is feeling considerably better. Both Dr. Kathrynn Humble and I have spoken with the patient and her son who agree with her being discharged home to follow up with her PCP. Patient to return for difficulty urinating, intractable fevers, intractable abdominal pain, or any other concerning symptoms. Patient and her son state understanding and agreement at this time. Patient is stable for discharge. Dr. Kathrynn Humble and I have discussed this patient and he has seen  and evaluated her and he agrees with the above workup and plan.   Cherylann Parr, PA-C 07/30/14 [place], MD 07/31/14 [id number]

## 2014-07-30 NOTE — ED Notes (Signed)
Pt is stable upon d/c and will return home with son. Pt escorted from ED via wheelchair.

## 2014-07-30 NOTE — Discharge Instructions (Signed)
Urinary Tract Infection Urinary tract infections (UTIs) can develop anywhere along your urinary tract. Your urinary tract is your body's drainage system for removing wastes and extra water. Your urinary tract includes two kidneys, two ureters, a bladder, and a urethra. Your kidneys are a pair of bean-shaped organs. Each kidney is about the size of your fist. They are located below your ribs, one on each side of your spine. CAUSES Infections are caused by microbes, which are microscopic organisms, including fungi, viruses, and bacteria. These organisms are so small that they can only be seen through a microscope. Bacteria are the microbes that most commonly cause UTIs. SYMPTOMS  Symptoms of UTIs may vary by age and gender of the patient and by the location of the infection. Symptoms in young women typically include a frequent and intense urge to urinate and a painful, burning feeling in the bladder or urethra during urination. Older women and men are more likely to be tired, shaky, and weak and have muscle aches and abdominal pain. A fever may mean the infection is in your kidneys. Other symptoms of a kidney infection include pain in your back or sides below the ribs, nausea, and vomiting. DIAGNOSIS To diagnose a UTI, your caregiver will ask you about your symptoms. Your caregiver also will ask to provide a urine sample. The urine sample will be tested for bacteria and white blood cells. White blood cells are made by your body to help fight infection. TREATMENT  Typically, UTIs can be treated with medication. Because most UTIs are caused by a bacterial infection, they usually can be treated with the use of antibiotics. The choice of antibiotic and length of treatment depend on your symptoms and the type of bacteria causing your infection. HOME CARE INSTRUCTIONS  If you were prescribed antibiotics, take them exactly as your caregiver instructs you. Finish the medication even if you feel better after you  have only taken some of the medication.  Drink enough water and fluids to keep your urine clear or pale yellow.  Avoid caffeine, tea, and carbonated beverages. They tend to irritate your bladder.  Empty your bladder often. Avoid holding urine for long periods of time.  Empty your bladder before and after sexual intercourse.  After a bowel movement, women should cleanse from front to back. Use each tissue only once. SEEK MEDICAL CARE IF:   You have back pain.  You develop a fever.  Your symptoms do not begin to resolve within 3 days. SEEK IMMEDIATE MEDICAL CARE IF:   You have severe back pain or lower abdominal pain.  You develop chills.  You have nausea or vomiting.  You have continued burning or discomfort with urination. MAKE SURE YOU:   Understand these instructions.  Will watch your condition.  Will get help right away if you are not doing well or get worse. Document Released: 04/12/2005 Document Revised: 01/02/2012 Document Reviewed: 08/11/2011 [facility] Patient Information 2015 [place], [place]. This information is not intended to replace advice given to you by your health care provider. Make sure you discuss any questions you have with your health care provider.  Weakness Weakness is a lack of strength. It may be felt all over the body (generalized) or in one specific part of the body (focal). Some causes of weakness can be serious. You may need further medical evaluation, especially if you are elderly or you have a history of immunosuppression (such as chemotherapy or HIV), kidney disease, heart disease, or diabetes. CAUSES  Weakness can be  caused by many different things, including:  Infection.  Physical exhaustion.  Internal bleeding or other blood loss that results in a lack of red blood cells (anemia).  Dehydration. This cause is more common in elderly people.  Side effects or electrolyte abnormalities from medicines, such as pain medicines or  sedatives.  Emotional distress, anxiety, or depression.  Circulation problems, especially severe peripheral arterial disease.  Heart disease, such as rapid atrial fibrillation, bradycardia, or heart failure.  Nervous system disorders, such as Guillain-Barr syndrome, multiple sclerosis, or stroke. DIAGNOSIS  To find the cause of your weakness, your caregiver will take your history and perform a physical exam. Lab tests or X-rays may also be ordered, if needed. TREATMENT  Treatment of weakness depends on the cause of your symptoms and can vary greatly. HOME CARE INSTRUCTIONS   Rest as needed.  Eat a well-balanced diet.  Try to get some exercise every day.  Only take over-the-counter or prescription medicines as directed by your caregiver. SEEK MEDICAL CARE IF:   Your weakness seems to be getting worse or spreads to other parts of your body.  You develop new aches or pains. SEEK IMMEDIATE MEDICAL CARE IF:   You cannot perform your normal daily activities, such as getting dressed and feeding yourself.  You cannot walk up and down stairs, or you feel exhausted when you do so.  You have shortness of breath or chest pain.  You have difficulty moving parts of your body.  You have weakness in only one area of the body or on only one side of the body.  You have a fever.  You have trouble speaking or swallowing.  You cannot control your bladder or bowel movements.  You have black or bloody vomit or stools. MAKE SURE YOU:  Understand these instructions.  Will watch your condition.  Will get help right away if you are not doing well or get worse. Document Released: 07/03/2005 Document Revised: 01/02/2012 Document Reviewed: 09/01/2011 [facility] Patient Information 2015 [employer], [place]. This information is not intended to replace advice given to you by your health care provider. Make sure you discuss any questions you have with your health care provider.

## 2014-07-30 NOTE — ED Notes (Signed)
Pt. Able to ambulate independently with walker in hallway approx. 200 ft. Pt. Denies dizziness/lightheadedness.

## 2014-07-30 NOTE — ED Notes (Signed)
Report from [place], rn. Pt care assumed

## 2014-07-30 NOTE — ED Notes (Signed)
MD at bedside. 

## 2014-07-31 LAB — URINE CULTURE
Colony Count: >=100000
SPECIAL REQUESTS: NORMAL

## 2014-08-03 DIAGNOSIS — I5032 Chronic diastolic (congestive) heart failure: Secondary | ICD-10-CM | POA: Diagnosis not present

## 2014-08-03 DIAGNOSIS — M109 Gout, unspecified: Secondary | ICD-10-CM | POA: Diagnosis not present

## 2014-08-03 DIAGNOSIS — I10 Essential (primary) hypertension: Secondary | ICD-10-CM | POA: Diagnosis not present

## 2014-08-03 DIAGNOSIS — R0600 Dyspnea, unspecified: Secondary | ICD-10-CM | POA: Diagnosis not present

## 2014-08-03 DIAGNOSIS — G8320 Monoplegia of upper limb affecting unspecified side: Secondary | ICD-10-CM | POA: Diagnosis not present

## 2014-08-03 DIAGNOSIS — R202 Paresthesia of skin: Secondary | ICD-10-CM | POA: Diagnosis not present

## 2014-08-03 DIAGNOSIS — I2510 Atherosclerotic heart disease of native coronary artery without angina pectoris: Secondary | ICD-10-CM | POA: Diagnosis not present

## 2014-08-12 DIAGNOSIS — R112 Nausea with vomiting, unspecified: Secondary | ICD-10-CM | POA: Diagnosis not present

## 2014-08-12 DIAGNOSIS — R203 Hyperesthesia: Secondary | ICD-10-CM | POA: Diagnosis not present

## 2014-10-19 DIAGNOSIS — H3531 Nonexudative age-related macular degeneration: Secondary | ICD-10-CM | POA: Diagnosis not present

## 2015-02-08 DIAGNOSIS — I679 Cerebrovascular disease, unspecified: Secondary | ICD-10-CM | POA: Diagnosis not present

## 2015-02-08 DIAGNOSIS — I10 Essential (primary) hypertension: Secondary | ICD-10-CM | POA: Diagnosis not present

## 2015-02-08 DIAGNOSIS — G8320 Monoplegia of upper limb affecting unspecified side: Secondary | ICD-10-CM | POA: Diagnosis not present

## 2015-02-08 DIAGNOSIS — G603 Idiopathic progressive neuropathy: Secondary | ICD-10-CM | POA: Diagnosis not present

## 2015-02-08 DIAGNOSIS — Z1389 Encounter for screening for other disorder: Secondary | ICD-10-CM | POA: Diagnosis not present

## 2015-02-08 DIAGNOSIS — G629 Polyneuropathy, unspecified: Secondary | ICD-10-CM | POA: Diagnosis not present

## 2015-02-08 DIAGNOSIS — I5032 Chronic diastolic (congestive) heart failure: Secondary | ICD-10-CM | POA: Diagnosis not present

## 2015-02-08 DIAGNOSIS — Z79899 Other long term (current) drug therapy: Secondary | ICD-10-CM | POA: Diagnosis not present

## 2015-02-08 DIAGNOSIS — Z0001 Encounter for general adult medical examination with abnormal findings: Secondary | ICD-10-CM | POA: Diagnosis not present

## 2015-02-08 DIAGNOSIS — I2510 Atherosclerotic heart disease of native coronary artery without angina pectoris: Secondary | ICD-10-CM | POA: Diagnosis not present

## 2015-02-08 DIAGNOSIS — K219 Gastro-esophageal reflux disease without esophagitis: Secondary | ICD-10-CM | POA: Diagnosis not present

## 2015-02-26 DIAGNOSIS — R1011 Right upper quadrant pain: Secondary | ICD-10-CM | POA: Diagnosis not present

## 2015-03-02 ENCOUNTER — Other Ambulatory Visit: Payer: Self-pay | Admitting: Internal Medicine

## 2015-03-02 DIAGNOSIS — R1011 Right upper quadrant pain: Secondary | ICD-10-CM

## 2015-03-09 ENCOUNTER — Ambulatory Visit
Admission: RE | Admit: 2015-03-09 | Discharge: 2015-03-09 | Disposition: A | Discharge status: Home / Self Care | Payer: Medicare Other | Source: Ambulatory Visit | Attending: Internal Medicine | Admitting: Internal Medicine

## 2015-03-09 ENCOUNTER — Other Ambulatory Visit: Payer: Self-pay | Admitting: Interventional Cardiology

## 2015-03-09 DIAGNOSIS — R109 Unspecified abdominal pain: Secondary | ICD-10-CM | POA: Diagnosis not present

## 2015-03-09 DIAGNOSIS — R1011 Right upper quadrant pain: Secondary | ICD-10-CM

## 2015-03-09 DIAGNOSIS — I6523 Occlusion and stenosis of bilateral carotid arteries: Secondary | ICD-10-CM

## 2015-03-11 DIAGNOSIS — R1032 Left lower quadrant pain: Secondary | ICD-10-CM | POA: Diagnosis not present

## 2015-03-16 ENCOUNTER — Ambulatory Visit (HOSPITAL_BASED_OUTPATIENT_CLINIC_OR_DEPARTMENT_OTHER)
Admission: RE | Admit: 2015-03-16 | Discharge: 2015-03-16 | Disposition: A | Discharge status: Home / Self Care | Payer: Medicare Other | Source: Ambulatory Visit | Attending: Cardiology | Admitting: Cardiology

## 2015-03-16 ENCOUNTER — Other Ambulatory Visit: Payer: Self-pay | Admitting: Internal Medicine

## 2015-03-16 DIAGNOSIS — R079 Chest pain, unspecified: Secondary | ICD-10-CM | POA: Diagnosis not present

## 2015-03-16 DIAGNOSIS — I6523 Occlusion and stenosis of bilateral carotid arteries: Secondary | ICD-10-CM

## 2015-03-16 DIAGNOSIS — K219 Gastro-esophageal reflux disease without esophagitis: Secondary | ICD-10-CM | POA: Diagnosis not present

## 2015-03-16 DIAGNOSIS — K8020 Calculus of gallbladder without cholecystitis without obstruction: Secondary | ICD-10-CM | POA: Diagnosis not present

## 2015-03-16 DIAGNOSIS — R1013 Epigastric pain: Secondary | ICD-10-CM | POA: Diagnosis not present

## 2015-03-16 DIAGNOSIS — I5032 Chronic diastolic (congestive) heart failure: Secondary | ICD-10-CM | POA: Diagnosis not present

## 2015-03-16 DIAGNOSIS — Z8673 Personal history of transient ischemic attack (TIA), and cerebral infarction without residual deficits: Secondary | ICD-10-CM | POA: Diagnosis not present

## 2015-03-16 DIAGNOSIS — K529 Noninfective gastroenteritis and colitis, unspecified: Secondary | ICD-10-CM | POA: Diagnosis not present

## 2015-03-16 DIAGNOSIS — J189 Pneumonia, unspecified organism: Secondary | ICD-10-CM | POA: Diagnosis not present

## 2015-03-16 DIAGNOSIS — R0602 Shortness of breath: Secondary | ICD-10-CM | POA: Diagnosis not present

## 2015-03-16 DIAGNOSIS — I10 Essential (primary) hypertension: Secondary | ICD-10-CM | POA: Diagnosis not present

## 2015-03-16 DIAGNOSIS — R1011 Right upper quadrant pain: Secondary | ICD-10-CM

## 2015-03-16 DIAGNOSIS — E871 Hypo-osmolality and hyponatremia: Secondary | ICD-10-CM | POA: Diagnosis not present

## 2015-03-17 ENCOUNTER — Ambulatory Visit
Admission: RE | Admit: 2015-03-17 | Discharge: 2015-03-17 | Disposition: A | Discharge status: Home / Self Care | Payer: Medicare Other | Source: Ambulatory Visit | Attending: Internal Medicine | Admitting: Internal Medicine

## 2015-03-17 ENCOUNTER — Other Ambulatory Visit: Payer: Self-pay | Admitting: Internal Medicine

## 2015-03-17 ENCOUNTER — Telehealth: Payer: Self-pay

## 2015-03-17 DIAGNOSIS — N281 Cyst of kidney, acquired: Secondary | ICD-10-CM | POA: Diagnosis not present

## 2015-03-17 DIAGNOSIS — I6523 Occlusion and stenosis of bilateral carotid arteries: Secondary | ICD-10-CM

## 2015-03-17 DIAGNOSIS — A09 Infectious gastroenteritis and colitis, unspecified: Secondary | ICD-10-CM | POA: Diagnosis not present

## 2015-03-17 DIAGNOSIS — R1031 Right lower quadrant pain: Secondary | ICD-10-CM | POA: Diagnosis not present

## 2015-03-17 DIAGNOSIS — K559 Vascular disorder of intestine, unspecified: Secondary | ICD-10-CM | POA: Diagnosis not present

## 2015-03-17 DIAGNOSIS — K7689 Other specified diseases of liver: Secondary | ICD-10-CM | POA: Diagnosis not present

## 2015-03-17 DIAGNOSIS — R1032 Left lower quadrant pain: Secondary | ICD-10-CM | POA: Diagnosis not present

## 2015-03-17 DIAGNOSIS — R1011 Right upper quadrant pain: Secondary | ICD-10-CM

## 2015-03-17 MED ORDER — IOPAMIDOL (ISOVUE-300) INJECTION 61%
100.0000 mL | Freq: Once | INTRAVENOUS | Status: AC | PRN
  Administered 2015-03-17: 100 mL via INTRAVENOUS

## 2015-03-17 NOTE — Telephone Encounter (Signed)
Pt aware of carotid results.carotid ok.Pt son verbalized understanding.

## 2015-03-17 NOTE — Telephone Encounter (Signed)
-----   Message from Belva Crome, MD sent at 03/17/2015  7:15 AM EDT ----- Carotids okay

## 2015-03-19 ENCOUNTER — Inpatient Hospital Stay (HOSPITAL_COMMUNITY)
Admission: EM | Admit: 2015-03-19 | Discharge: 2015-03-21 | DRG: 194 | Disposition: A | Discharge status: Home Health | Payer: Medicare Other | Attending: Internal Medicine | Admitting: Internal Medicine

## 2015-03-19 ENCOUNTER — Emergency Department (HOSPITAL_COMMUNITY): Payer: Medicare Other

## 2015-03-19 ENCOUNTER — Encounter (HOSPITAL_COMMUNITY): Payer: Self-pay | Admitting: Family Medicine

## 2015-03-19 DIAGNOSIS — I5032 Chronic diastolic (congestive) heart failure: Secondary | ICD-10-CM | POA: Diagnosis present

## 2015-03-19 DIAGNOSIS — Z800 Family history of malignant neoplasm of digestive organs: Secondary | ICD-10-CM | POA: Diagnosis not present

## 2015-03-19 DIAGNOSIS — R001 Bradycardia, unspecified: Secondary | ICD-10-CM | POA: Diagnosis not present

## 2015-03-19 DIAGNOSIS — K529 Noninfective gastroenteritis and colitis, unspecified: Secondary | ICD-10-CM | POA: Diagnosis not present

## 2015-03-19 DIAGNOSIS — I10 Essential (primary) hypertension: Secondary | ICD-10-CM | POA: Diagnosis not present

## 2015-03-19 DIAGNOSIS — D72829 Elevated white blood cell count, unspecified: Secondary | ICD-10-CM | POA: Diagnosis present

## 2015-03-19 DIAGNOSIS — I2510 Atherosclerotic heart disease of native coronary artery without angina pectoris: Secondary | ICD-10-CM | POA: Diagnosis present

## 2015-03-19 DIAGNOSIS — R079 Chest pain, unspecified: Secondary | ICD-10-CM | POA: Diagnosis not present

## 2015-03-19 DIAGNOSIS — J189 Pneumonia, unspecified organism: Principal | ICD-10-CM | POA: Diagnosis present

## 2015-03-19 DIAGNOSIS — R0602 Shortness of breath: Secondary | ICD-10-CM | POA: Diagnosis not present

## 2015-03-19 DIAGNOSIS — Z8249 Family history of ischemic heart disease and other diseases of the circulatory system: Secondary | ICD-10-CM

## 2015-03-19 DIAGNOSIS — R1013 Epigastric pain: Secondary | ICD-10-CM | POA: Diagnosis not present

## 2015-03-19 DIAGNOSIS — E871 Hypo-osmolality and hyponatremia: Secondary | ICD-10-CM | POA: Diagnosis not present

## 2015-03-19 DIAGNOSIS — K219 Gastro-esophageal reflux disease without esophagitis: Secondary | ICD-10-CM | POA: Diagnosis present

## 2015-03-19 DIAGNOSIS — Z8673 Personal history of transient ischemic attack (TIA), and cerebral infarction without residual deficits: Secondary | ICD-10-CM

## 2015-03-19 DIAGNOSIS — K8020 Calculus of gallbladder without cholecystitis without obstruction: Secondary | ICD-10-CM

## 2015-03-19 DIAGNOSIS — I25119 Atherosclerotic heart disease of native coronary artery with unspecified angina pectoris: Secondary | ICD-10-CM

## 2015-03-19 LAB — BASIC METABOLIC PANEL
ANION GAP: 10 (ref 5–15)
BUN: 14 mg/dL (ref 6–20)
CHLORIDE: 99 mmol/L — AB (ref 101–111)
CO2: 20 mmol/L — AB (ref 22–32)
Calcium: 8.8 mg/dL — ABNORMAL LOW (ref 8.9–10.3)
Creatinine, Ser: 0.98 mg/dL (ref 0.44–1.00)
GFR calc Af Amer: 59 mL/min — ABNORMAL LOW (ref >60)
GFR calc non Af Amer: 51 mL/min — ABNORMAL LOW (ref >60)
GLUCOSE: 88 mg/dL (ref 65–99)
POTASSIUM: 4.6 mmol/L (ref 3.5–5.1)
Sodium: 129 mmol/L — ABNORMAL LOW (ref 135–145)

## 2015-03-19 LAB — HEPATIC FUNCTION PANEL
ALK PHOS: 41 U/L (ref 38–126)
ALT: 43 U/L (ref 14–54)
AST: 42 U/L — AB (ref 15–41)
Albumin: 3.5 g/dL (ref 3.5–5.0)
Total Bilirubin: 0.6 mg/dL (ref 0.3–1.2)
Total Protein: 6.1 g/dL — ABNORMAL LOW (ref 6.5–8.1)

## 2015-03-19 LAB — TROPONIN I: Troponin I: <0.03 ng/mL (ref <0.031)

## 2015-03-19 LAB — CBC
HEMATOCRIT: 40.2 % (ref 36.0–46.0)
HEMOGLOBIN: 13.8 g/dL (ref 12.0–15.0)
MCH: 32.6 pg (ref 26.0–34.0)
MCHC: 34.3 g/dL (ref 30.0–36.0)
MCV: 95 fL (ref 78.0–100.0)
Platelets: 314 10*3/uL (ref 150–400)
RBC: 4.23 MIL/uL (ref 3.87–5.11)
RDW: 14.2 % (ref 11.5–15.5)
WBC: 18.9 10*3/uL — ABNORMAL HIGH (ref 4.0–10.5)

## 2015-03-19 LAB — I-STAT TROPONIN, ED: Troponin i, poc: 0 ng/mL (ref 0.00–0.08)

## 2015-03-19 LAB — BRAIN NATRIURETIC PEPTIDE: B Natriuretic Peptide: 232 pg/mL — ABNORMAL HIGH (ref 0.0–100.0)

## 2015-03-19 LAB — LIPASE, BLOOD: Lipase: 39 U/L (ref 22–51)

## 2015-03-19 MED ORDER — SODIUM CHLORIDE 0.9 % IV SOLN: 250.0000 mL | INTRAVENOUS | Status: DC | PRN

## 2015-03-19 MED ORDER — LEVOFLOXACIN IN D5W 750 MG/150ML IV SOLN: 750.0000 mg | INTRAVENOUS | Status: DC

## 2015-03-19 MED ORDER — DILTIAZEM HCL ER 120 MG PO CP24
120.0000 mg | ORAL_CAPSULE | Freq: Every day | ORAL | Status: DC
  Administered 2015-03-20 – 2015-03-21 (×2): 120 mg via ORAL
  Filled 2015-03-19 (×4): qty 1

## 2015-03-19 MED ORDER — LEVOFLOXACIN IN D5W 500 MG/100ML IV SOLN: 500.0000 mg | INTRAVENOUS | Status: DC

## 2015-03-19 MED ORDER — ONDANSETRON HCL 4 MG/2ML IJ SOLN: 4.0000 mg | Freq: Four times a day (QID) | INTRAMUSCULAR | Status: DC | PRN

## 2015-03-19 MED ORDER — METOPROLOL TARTRATE 12.5 MG HALF TABLET
12.5000 mg | ORAL_TABLET | Freq: Two times a day (BID) | ORAL | Status: DC
  Administered 2015-03-19: 12.5 mg via ORAL
  Filled 2015-03-19: qty 1

## 2015-03-19 MED ORDER — SACCHAROMYCES BOULARDII 250 MG PO CAPS
250.0000 mg | ORAL_CAPSULE | Freq: Every day | ORAL | Status: DC
  Administered 2015-03-20 – 2015-03-21 (×2): 250 mg via ORAL
  Filled 2015-03-19 (×2): qty 1

## 2015-03-19 MED ORDER — FERROUS SULFATE 325 (65 FE) MG PO TABS: 325.0000 mg | ORAL_TABLET | Freq: Two times a day (BID) | ORAL | Status: DC

## 2015-03-19 MED ORDER — SODIUM CHLORIDE 0.9 % IJ SOLN: 3.0000 mL | INTRAMUSCULAR | Status: DC | PRN

## 2015-03-19 MED ORDER — LATANOPROST 0.005 % OP SOLN
1.0000 [drp] | Freq: Every day | OPHTHALMIC | Status: DC
  Administered 2015-03-19 – 2015-03-20 (×2): 1 [drp] via OPHTHALMIC
  Filled 2015-03-19: qty 2.5

## 2015-03-19 MED ORDER — CLOPIDOGREL BISULFATE 75 MG PO TABS
75.0000 mg | ORAL_TABLET | Freq: Every day | ORAL | Status: DC
  Administered 2015-03-20 – 2015-03-21 (×2): 75 mg via ORAL
  Filled 2015-03-19 (×2): qty 1

## 2015-03-19 MED ORDER — ONDANSETRON HCL 4 MG PO TABS: 4.0000 mg | ORAL_TABLET | Freq: Four times a day (QID) | ORAL | Status: DC | PRN

## 2015-03-19 MED ORDER — ALUM & MAG HYDROXIDE-SIMETH 200-200-20 MG/5ML PO SUSP: 30.0000 mL | Freq: Four times a day (QID) | ORAL | Status: DC | PRN

## 2015-03-19 MED ORDER — ACETAMINOPHEN 325 MG PO TABS
650.0000 mg | ORAL_TABLET | Freq: Four times a day (QID) | ORAL | Status: DC | PRN
  Administered 2015-03-19 – 2015-03-20 (×2): 650 mg via ORAL
  Filled 2015-03-19 (×2): qty 2

## 2015-03-19 MED ORDER — NITROGLYCERIN 0.4 MG SL SUBL: 0.4000 mg | SUBLINGUAL_TABLET | SUBLINGUAL | Status: DC | PRN

## 2015-03-19 MED ORDER — PANTOPRAZOLE SODIUM 40 MG PO TBEC
40.0000 mg | DELAYED_RELEASE_TABLET | Freq: Every day | ORAL | Status: DC
  Administered 2015-03-20 – 2015-03-21 (×2): 40 mg via ORAL
  Filled 2015-03-19 (×2): qty 1

## 2015-03-19 MED ORDER — ENOXAPARIN SODIUM 30 MG/0.3ML ~~LOC~~ SOLN
30.0000 mg | SUBCUTANEOUS | Status: DC
  Administered 2015-03-19 – 2015-03-20 (×2): 30 mg via SUBCUTANEOUS
  Filled 2015-03-19 (×2): qty 0.3

## 2015-03-19 MED ORDER — LORATADINE 10 MG PO TABS
10.0000 mg | ORAL_TABLET | Freq: Every day | ORAL | Status: DC
  Administered 2015-03-20 – 2015-03-21 (×2): 10 mg via ORAL
  Filled 2015-03-19 (×2): qty 1

## 2015-03-19 MED ORDER — IOHEXOL 300 MG/ML  SOLN
75.0000 mL | Freq: Once | INTRAMUSCULAR | Status: AC | PRN
  Administered 2015-03-19: 75 mL via INTRAVENOUS

## 2015-03-19 MED ORDER — OXYCODONE HCL 5 MG PO TABS: 5.0000 mg | ORAL_TABLET | ORAL | Status: DC | PRN

## 2015-03-19 MED ORDER — TRAZODONE HCL 150 MG PO TABS
150.0000 mg | ORAL_TABLET | Freq: Every day | ORAL | Status: DC
  Administered 2015-03-19 – 2015-03-20 (×2): 150 mg via ORAL
  Filled 2015-03-19 (×2): qty 1

## 2015-03-19 MED ORDER — NITROGLYCERIN 2 % TD OINT
1.0000 [in_us] | TOPICAL_OINTMENT | Freq: Four times a day (QID) | TRANSDERMAL | Status: DC
  Administered 2015-03-20: 1 [in_us] via TOPICAL
  Filled 2015-03-19: qty 30

## 2015-03-19 MED ORDER — FOLIC ACID 1 MG PO TABS
1.0000 mg | ORAL_TABLET | Freq: Every day | ORAL | Status: DC
  Administered 2015-03-20 – 2015-03-21 (×2): 1 mg via ORAL
  Filled 2015-03-19 (×2): qty 1

## 2015-03-19 MED ORDER — SODIUM CHLORIDE 0.9 % IJ SOLN
3.0000 mL | Freq: Two times a day (BID) | INTRAMUSCULAR | Status: DC
  Administered 2015-03-19 – 2015-03-20 (×2): 3 mL via INTRAVENOUS

## 2015-03-19 MED ORDER — SODIUM CHLORIDE 0.9 % IJ SOLN
3.0000 mL | Freq: Two times a day (BID) | INTRAMUSCULAR | Status: DC
  Administered 2015-03-19 – 2015-03-21 (×4): 3 mL via INTRAVENOUS

## 2015-03-19 MED ORDER — TIMOLOL MALEATE 0.5 % OP SOLG
1.0000 [drp] | Freq: Every day | OPHTHALMIC | Status: DC
  Administered 2015-03-20 – 2015-03-21 (×2): 1 [drp] via OPHTHALMIC
  Filled 2015-03-19: qty 5

## 2015-03-19 MED ORDER — ALPRAZOLAM 0.25 MG PO TABS
0.2500 mg | ORAL_TABLET | Freq: Two times a day (BID) | ORAL | Status: DC
  Administered 2015-03-19 – 2015-03-21 (×4): 0.25 mg via ORAL
  Filled 2015-03-19 (×4): qty 1

## 2015-03-19 MED ORDER — ASPIRIN 325 MG PO TABS
325.0000 mg | ORAL_TABLET | Freq: Every day | ORAL | Status: DC
  Administered 2015-03-19 – 2015-03-21 (×3): 325 mg via ORAL
  Filled 2015-03-19 (×3): qty 1

## 2015-03-19 MED ORDER — ACETAMINOPHEN 650 MG RE SUPP: 650.0000 mg | Freq: Four times a day (QID) | RECTAL | Status: DC | PRN

## 2015-03-19 MED ORDER — CALCIUM CARBONATE-VITAMIN D 500-200 MG-UNIT PO TABS
1.0000 | ORAL_TABLET | Freq: Every day | ORAL | Status: DC
  Administered 2015-03-20 – 2015-03-21 (×2): 1 via ORAL
  Filled 2015-03-19 (×4): qty 1

## 2015-03-19 MED ORDER — ESCITALOPRAM OXALATE 20 MG PO TABS
20.0000 mg | ORAL_TABLET | Freq: Every morning | ORAL | Status: DC
Start: 2015-03-20 — End: 2015-03-21
  Administered 2015-03-20 – 2015-03-21 (×2): 20 mg via ORAL
  Filled 2015-03-19 (×2): qty 1

## 2015-03-19 MED ORDER — RANOLAZINE ER 500 MG PO TB12
500.0000 mg | ORAL_TABLET | Freq: Every day | ORAL | Status: DC
  Administered 2015-03-19 – 2015-03-21 (×3): 500 mg via ORAL
  Filled 2015-03-19 (×4): qty 1

## 2015-03-19 MED ORDER — LEVOFLOXACIN IN D5W 500 MG/100ML IV SOLN
500.0000 mg | Freq: Once | INTRAVENOUS | Status: AC
  Administered 2015-03-19: 500 mg via INTRAVENOUS
  Filled 2015-03-19: qty 100

## 2015-03-19 MED ORDER — HYDROMORPHONE HCL 1 MG/ML IJ SOLN: 0.5000 mg | INTRAMUSCULAR | Status: DC | PRN

## 2015-03-19 NOTE — ED Notes (Signed)
Pt here for episode of chest pain this am with radiation to neck. sts sharp and stabbing and worse with breathing. sts the pain has eased now. sts some SOB.

## 2015-03-19 NOTE — H&P (Signed)
[facility] Admission History and Physical       Ellen Marshall YQI:[id number] DOB: 10-14-1925 DOA: 03/19/2015  Referring physician: EDP PCP: Henrine Screws, MD  Specialists:   Chief Complaint: Chest Pain  HPI: Ellen Marshall is a 81 y.o. female with a history of CAD, Chronic Diastolic CHF, HTN and recent Colitis who presents to the ED with complaints of Sharp Chest Pain across her chest radiating into her neck and face since 10 AM.    She had associated SOB.  In the ED, a CT sxcan of the chest was performed and an 8 x !3 mm RML infiltrate was seen, so her antibiotic Rx was expanded to Levaquin instead of the previous Cipro she had been on for Colitis and continued on the Flagyl Rx.    Her Initial Troponin was 0.00.   She was referred for admission.       Review of Systems: Unable to Obtain from the Patient Constitutional: No Weight Loss, No Weight Gain, Night Sweats, Fevers, Chills, Dizziness, Light Headedness, Fatigue, or Generalized Weakness HEENT: No Headaches, Difficulty Swallowing,Tooth/Dental Problems,Sore Throat,  No Sneezing, Rhinitis, Ear Ache, Nasal Congestion, or Post Nasal Drip,  Cardio-vascular:  +Chest pain, Orthopnea, PND, Edema in Lower Extremities, Anasarca, Dizziness, Palpitations  Resp: +Dyspnea, No DOE, No Productive Cough, No Non-Productive Cough, No Hemoptysis, No Wheezing.    GI: No Heartburn, Indigestion, Abdominal Pain, Nausea, Vomiting, Diarrhea, Constipation, Hematemesis, Hematochezia, Melena, Change in Bowel Habits,  Loss of Appetite  GU: No Dysuria, No Change in Color of Urine, No Urgency or Urinary Frequency, No Flank pain.  Musculoskeletal: No Joint Pain or Swelling, No Decreased Range of Motion, No Back Pain.  Neurologic: No Syncope, No Seizures, Muscle Weakness, Paresthesia, Vision Disturbance or Loss, No Diplopia, No Vertigo, No Difficulty Walking,  Skin: No Rash or Lesions. Psych: No Change in Mood or Affect, No Depression or Anxiety, No Memory  loss, No Confusion, or Hallucinations   Past Medical History  Diagnosis Date  . Coronary artery disease   . Hypertension   . GERD (gastroesophageal reflux disease)   . Shortness of breath     laying down  . Stroke   . Chest pain      Past Surgical History  Procedure Laterality Date  . Colonscopy    . Esophagogastroduodenoscopy    . Stretching of egd    . Abdominal hysterectomy    . Esophagogastroduodenoscopy  10/10/2011    Procedure: ESOPHAGOGASTRODUODENOSCOPY (EGD);  Surgeon: Winfield Cunas., MD;  Location: [facility] ENDOSCOPY;  Service: Endoscopy;  Laterality: N/A;  with control of bleeding  . Esophagogastroduodenoscopy  10/12/2011    Procedure: ESOPHAGOGASTRODUODENOSCOPY (EGD);  Surgeon: Winfield Cunas., MD;  Location: [facility] ENDOSCOPY;  Service: Endoscopy;  Laterality: N/A;  . Flexible sigmoidoscopy  10/12/2011    Procedure: FLEXIBLE SIGMOIDOSCOPY;  Surgeon: Winfield Cunas., MD;  Location: [facility] ENDOSCOPY;  Service: Endoscopy;  Laterality: N/A;      Prior to Admission medications   Medication Sig Start Date End Date Taking? Authorizing Provider  Acetaminophen 650 MG TABS Take 1,300 mg by mouth 2 (two) times daily.    Yes Historical Provider, MD  ALPRAZolam (XANAX) 0.25 MG tablet Take 0.25 mg by mouth 2 (two) times daily. As needed for anxiety. Takes at 2pm and bedtime   Yes Historical Provider, MD  aspirin EC 81 MG tablet Take 1 tablet (81 mg total) by mouth daily. 04/20/14  Yes Liliane Shi, PA-C  Calcium Carbonate-Vitamin D (CALCIUM + D)  600-200 MG-UNIT TABS Take 1 tablet by mouth daily.    Yes Historical Provider, MD  cephALEXin (KEFLEX) 500 MG capsule Take 1 capsule (500 mg total) by mouth 3 (three) times daily. Patient taking differently: Take 500 mg by mouth 3 (three) times daily. Started on 03-16-15 07/30/14  Yes Courtney Forcucci, PA-C  clopidogrel (PLAVIX) 75 MG tablet Take 75 mg by mouth daily after lunch.    Yes Historical Provider, MD  diltiazem (DILACOR XR) 120 MG 24  hr capsule Take 120 mg by mouth daily.   Yes Historical Provider, MD  diphenoxylate-atropine (LOMOTIL) 2.5-0.025 MG per tablet Take 2 tablets by mouth 4 (four) times daily as needed for diarrhea or loose stools.    Yes Historical Provider, MD  escitalopram (LEXAPRO) 20 MG tablet Take 20 mg by mouth every morning.   Yes Historical Provider, MD  ferrous sulfate 325 (65 FE) MG tablet Take 325 mg by mouth 2 (two) times daily with a meal.   Yes Historical Provider, MD  fexofenadine (ALLEGRA) 180 MG tablet Take 180 mg by mouth daily.   Yes Historical Provider, MD  folic acid (FOLVITE) 132 MCG tablet Take 800 mcg by mouth daily.   Yes Historical Provider, MD  isosorbide mononitrate (IMDUR) 120 MG 24 hr tablet Take 1 tablet (120 mg total) by mouth daily. Patient taking differently: Take 120 mg by mouth at bedtime.  03/25/14  Yes Scott T Weaver, PA-C  Lutein 20 MG TABS Take 1 tablet by mouth every morning.   Yes Historical Provider, MD  metoprolol tartrate (LOPRESSOR) 25 MG tablet Take 12.5 mg by mouth 2 (two) times daily.   Yes Historical Provider, MD  Multiple Vitamins-Minerals (CENTRUM SILVER PO) Take 1 tablet by mouth daily at 12 noon.   Yes Historical Provider, MD  nitroGLYCERIN (NITROSTAT) 0.4 MG SL tablet Place 0.4 mg under the tongue every 5 (five) minutes as needed. As needed for chest pain.   Yes Historical Provider, MD  pantoprazole (PROTONIX) 40 MG tablet Take 40 mg by mouth daily.   Yes Historical Provider, MD  ranolazine (RANEXA) 500 MG 12 hr tablet Take 500 mg by mouth daily.   Yes Historical Provider, MD  saccharomyces boulardii (FLORASTOR) 250 MG capsule Take 250 mg by mouth daily.    Yes Historical Provider, MD  timolol (TIMOPTIC-XR) 0.5 % ophthalmic gel-forming Place 1 drop into both eyes daily.   Yes Historical Provider, MD  travoprost, benzalkonium, (TRAVATAN) 0.004 % ophthalmic solution Place 1 drop into both eyes at bedtime.   Yes Historical Provider, MD  traZODone (DESYREL) 50 MG tablet  Take 150 mg by mouth at bedtime.   Yes Historical Provider, MD  ranolazine (RANEXA) 1000 MG SR tablet Take 1 tablet (1,000 mg total) by mouth 2 (two) times daily. Patient not taking: Reported on 03/19/2015 06/09/14   Belva Crome, MD     Allergies  Allergen Reactions  . Adhesive [Tape] Other (See Comments)    Burning, Itching.  . Amoxicillin Diarrhea  . Pregabalin     Weakness, diarrhea   . Simvastatin     Myalgias   . Sulfa Antibiotics Itching    Social History:  reports that she has never smoked. She has never used smokeless tobacco. She reports that she does not drink alcohol or use illicit drugs.    Family History  Problem Relation Age of Onset  . CAD Father   . Pancreatic cancer Mother   . CAD Mother   . CAD Brother   .  Hypertension Sister   . CAD Sister   . Heart attack Mother   . Heart attack Father   . Heart attack Sister   . Heart attack Brother        Physical Exam:  GEN:  Pleasant Elderly Thin  81 y.o. Caucasian female examined and in no acute distress; cooperative with exam Filed Vitals:   03/19/15 1745 03/19/15 1815 03/19/15 1924 03/19/15 2000  BP:  179/60 187/62 173/61  Pulse: 72 78 78 81  Temp:      Resp: 19 19 19 20   SpO2: 97% 99% 98% 96%   Blood pressure 173/61, pulse 81, temperature 97.4 F (36.3 C), resp. rate 20, SpO2 96 %. PSYCH: She is alert and oriented x4; does not appear anxious does not appear depressed; affect is normal HEENT: Normocephalic and Atraumatic, Mucous membranes pink; PERRLA; EOM intact; Fundi:  Benign;  No scleral icterus, Nares: Patent, Oropharynx: Clear, Fair Dentition,    Neck:  FROM, No Cervical Lymphadenopathy nor Thyromegaly or Carotid Bruit; No JVD; Breasts:: Not examined CHEST WALL: No tenderness CHEST: Normal respiration, clear to auscultation bilaterally HEART: Regular rate and rhythm; no murmurs rubs or gallops BACK: No kyphosis or scoliosis; No CVA tenderness ABDOMEN: Positive Bowel Sounds, Soft Non-Tender, No  Rebound or Guarding; No Masses, No Organomegaly Rectal Exam: Not done EXTREMITIES: No Cyanosis, Clubbing, or Edema; No Ulcerations. Genitalia: not examined PULSES: 2+ and symmetric SKIN: Normal hydration no rash or ulceration CNS:  Alert and Oriented x 4, No Focal Deficits Vascular: pulses palpable throughout    Labs on Admission:  Basic Metabolic Panel:  Recent Labs Lab 03/19/15 1316  NA 129*  K 4.6  CL 99*  CO2 20*  GLUCOSE 88  BUN 14  CREATININE 0.98  CALCIUM 8.8*   Liver Function Tests:  Recent Labs Lab 03/19/15 1526  AST 42*  ALT 43  ALKPHOS 41  BILITOT 0.6  PROT 6.1*  ALBUMIN 3.5    Recent Labs Lab 03/19/15 1526  LIPASE 39   No results for input(s): AMMONIA in the last 168 hours. CBC:  Recent Labs Lab 03/19/15 1316  WBC 18.9*  HGB 13.8  HCT 40.2  MCV 95.0  PLT 314   Cardiac Enzymes: No results for input(s): CKTOTAL, CKMB, CKMBINDEX, TROPONINI in the last 168 hours.  BNP (last 3 results) No results for input(s): BNP in the last 8760 hours.  ProBNP (last 3 results) No results for input(s): PROBNP in the last 8760 hours.  CBG: No results for input(s): GLUCAP in the last 168 hours.  Radiological Exams on Admission: Dg Chest 2 View  03/19/2015   CLINICAL DATA:  Chest pain, shortness of breath  EXAM: CHEST  2 VIEW  COMPARISON:  07/22/2014  FINDINGS: Mild cardiomegaly noted with central vascular congestion but no overt edema. No focal pulmonary opacity. No pleural effusion. Retained contrast in nondilated bowel. Atheromatous aortic calcification without calcified aneurysm.  IMPRESSION: Cardiomegaly without focal acute finding.   Electronically Signed   By: Conchita Paris M.D.   On: 03/19/2015 13:53   Ct Chest W Contrast  03/19/2015   CLINICAL DATA:  81 year old female with sharp stabbing chest pain since this morning, increasing with breathing.  EXAM: CT CHEST WITH CONTRAST  TECHNIQUE: Multidetector CT imaging of the chest was performed during  intravenous contrast administration.  CONTRAST:  47mL OMNIPAQUE IOHEXOL 300 MG/ML  SOLN  COMPARISON:  No priors.  FINDINGS: Mediastinum/Lymph Nodes: Heart size is mildly enlarged. There is no significant pericardial fluid, thickening  or pericardial calcification. There is atherosclerosis of the thoracic aorta, the great vessels of the mediastinum and the coronary arteries, including calcified atherosclerotic plaque in the left main, left anterior descending and right coronary arteries. Mild calcifications of the mitral annulus. No pathologically enlarged mediastinal or hilar lymph nodes. Esophagus is unremarkable in appearance. No axillary lymphadenopathy.  Lungs/Pleura: 8 x 13 mm ground-glass attenuation nodule in the lateral segment of the right middle lobe (image 26 of series 3). 4 mm subpleural nodule in the medial aspect of the left lower lobe (image 34 of series 3), unchanged. No acute consolidative airspace disease. No pleural effusions. Areas of dependent subsegmental atelectasis in the lower lobes of the lungs bilaterally.  Upper Abdomen: Atherosclerosis.  Musculoskeletal/Soft Tissues: There are no aggressive appearing lytic or blastic lesions noted in the visualized portions of the skeleton.  IMPRESSION: 1. 8 x 13 mm ground-glass attenuation nodule in the lateral segment of the right middle lobe. This is favored to be infectious or inflammatory, however, neoplasm is not excluded. Initial follow-up by chest CT without contrast is recommended in 3 months to confirm persistence. This recommendation follows the consensus statement: Recommendations for the Management of Subsolid Pulmonary Nodules Detected at CT: A Statement from the [place] as published in Radiology 2013; 266:304-317. 2. No other potentially acute findings noted in the thorax on today's examination. 3. Atherosclerosis, including left main and 2 vessel coronary artery disease. 4. Additional incidental findings, as above.    Electronically Signed   By: Vinnie Langton M.D.   On: 03/19/2015 18:09     EKG: Independently reviewed. Sinus Bradycardia Rate =58   Assessment/Plan:   81 y.o. female with  Principal Problem:   1.    CAP (community acquired pneumonia)   IV Levaquin   O2 PRN   Albuterol Nebs PRN   Active Problems:   2.    Chest pain   Cardiac Monitoring   Cycle Troponins   Nitropaste, O2,  ASA, Metoprolol, Plavix , and      3.    Chronic diastolic heart failure   Strict I/Os       4.    Hyponatremia   IVFs   Monitor Na+     5.    Leukocytosis- Due to #1   Monitor Trend        6.    CAD (coronary artery disease)   Continue Metoprolol, ASA, Raqnexa     7.    Bradycardia   Monitor    Hold Diltiazem and Metotprolol     8.    HTN (hypertension)   Continue Metoprolol and Diltiazem as BP Tolerates   Monitor BPs     9.    DVT Prophylaxis   Lovenox    Code Status:     FULL CODE        Family Communication:   Family at Bedside    Disposition Plan:    Inpatient Status        Time spent:   [place] [facility] Pager [phone]   If [place] Please Contact the Day Rounding Team MD for [facility]  If 7PM-7AM, Please Contact Night-Floor Coverage  [email or web address] Password [email or web address] 03/19/2015, 8:19 PM     ADDENDUM:   Patient was seen and examined on 03/19/2015

## 2015-03-19 NOTE — ED Provider Notes (Signed)
CSN: [id number]     Arrival date & time 03/19/15  1223 History   First MD Initiated Contact with Patient 03/19/15 1359     Chief Complaint  Patient presents with  . Chest Pain     (Consider location/radiation/quality/duration/timing/severity/associated sxs/prior Treatment) HPI 81 y.o. Female complaining of chest pain began about 10 am today.  No similar symptoms in past.  Pain severre and radiated to bilateral neck and felt dyspneic.  Lasted about 2 huors took two nitro and baby aspirin.  Pain resolved after both eventually resol ed.  EMS  Called but patient transported by pv.  She has a history of cad but denies that it feels like prior angina.  She has had chills for several days- she denies fever,no loss of apetite.  She takes in "lots of fluid."  Denies \\uti  symptoms.  She has been on cipro and flagyl for colitis for two weeks.  CT of abdomen done this week, US done Tuesday.    Dr. Inda Merlin pmd, card- Tamala Julian, no gi. Past Medical History  Diagnosis Date  . Coronary artery disease   . Hypertension   . GERD (gastroesophageal reflux disease)   . Shortness of breath     laying down  . Stroke   . Chest pain    Past Surgical History  Procedure Laterality Date  . Colonscopy    . Esophagogastroduodenoscopy    . Stretching of egd    . Abdominal hysterectomy    . Esophagogastroduodenoscopy  10/10/2011    Procedure: ESOPHAGOGASTRODUODENOSCOPY (EGD);  Surgeon: Winfield Cunas., MD;  Location: [facility] ENDOSCOPY;  Service: Endoscopy;  Laterality: N/A;  with control of bleeding  . Esophagogastroduodenoscopy  10/12/2011    Procedure: ESOPHAGOGASTRODUODENOSCOPY (EGD);  Surgeon: Winfield Cunas., MD;  Location: [facility] ENDOSCOPY;  Service: Endoscopy;  Laterality: N/A;  . Flexible sigmoidoscopy  10/12/2011    Procedure: FLEXIBLE SIGMOIDOSCOPY;  Surgeon: Winfield Cunas., MD;  Location: [facility] ENDOSCOPY;  Service: Endoscopy;  Laterality: N/A;   Family History  Problem Relation Age of Onset  . CAD Father    . Pancreatic cancer Mother   . CAD Mother   . CAD Brother   . Hypertension Sister   . CAD Sister   . Heart attack Mother   . Heart attack Father   . Heart attack Sister   . Heart attack Brother    Social History  Substance Use Topics  . Smoking status: Never Smoker   . Smokeless tobacco: Never Used  . Alcohol Use: No     Comment: none in 20 years - 08/12/13   OB History    No data available     Review of Systems  Constitutional: Positive for chills. Negative for fever.  HENT: Negative.   Eyes: Negative.   Respiratory: Positive for cough and shortness of breath.   Cardiovascular: Positive for chest pain. Negative for leg swelling.  Gastrointestinal: Positive for diarrhea. Negative for nausea, vomiting and rectal pain.  Endocrine: Negative.   Genitourinary: Negative for frequency and difficulty urinating.  Musculoskeletal: Negative.   Skin: Negative.   Allergic/Immunologic: Negative.   Neurological: Negative.   Psychiatric/Behavioral: Negative.       Allergies  Adhesive; Amoxicillin; Simvastatin; and Sulfa antibiotics  Home Medications   Prior to Admission medications   Medication Sig Start Date End Date Taking? Authorizing Provider  Acetaminophen 650 MG TABS Take 1,300 mg by mouth 3 (three) times daily.    Historical Provider, MD  ALPRAZolam Duanne Moron) 0.25 MG  tablet Take 0.25 mg by mouth 2 (two) times daily. As needed for anxiety. Takes at 2pm and bedtime    Historical Provider, MD  aspirin EC 81 MG tablet Take 1 tablet (81 mg total) by mouth daily. 04/20/14   Liliane Shi, PA-C  Calcium Carbonate-Vitamin D (CALCIUM + D) 600-200 MG-UNIT TABS Take 1 tablet by mouth 2 (two) times daily.    Historical Provider, MD  cephALEXin (KEFLEX) 500 MG capsule Take 1 capsule (500 mg total) by mouth 3 (three) times daily. 07/30/14   Courtney Forcucci, PA-C  clopidogrel (PLAVIX) 75 MG tablet Take 75 mg by mouth daily after lunch.     Historical Provider, MD  diltiazem (DILACOR XR)  120 MG 24 hr capsule Take 120 mg by mouth daily.    Historical Provider, MD  diphenoxylate-atropine (LOMOTIL) 2.5-0.025 MG per tablet Take 2 tablets by mouth 4 (four) times daily as needed for diarrhea or loose stools.     Historical Provider, MD  escitalopram (LEXAPRO) 20 MG tablet Take 20 mg by mouth every morning.    Historical Provider, MD  ferrous sulfate 325 (65 FE) MG tablet Take 325 mg by mouth 2 (two) times daily with a meal.    Historical Provider, MD  fexofenadine (ALLEGRA) 180 MG tablet Take 180 mg by mouth daily.    Historical Provider, MD  folic acid (FOLVITE) 330 MCG tablet Take 800 mcg by mouth daily.    Historical Provider, MD  isosorbide mononitrate (IMDUR) 120 MG 24 hr tablet Take 1 tablet (120 mg total) by mouth daily. 03/25/14   Scott Joylene Draft, PA-C  Lutein 20 MG TABS Take 1 tablet by mouth every morning.    Historical Provider, MD  metoprolol tartrate (LOPRESSOR) 25 MG tablet Take 12.5 mg by mouth 2 (two) times daily.    Historical Provider, MD  Multiple Vitamins-Minerals (CENTRUM SILVER PO) Take 1 tablet by mouth daily at 12 noon.    Historical Provider, MD  nitroGLYCERIN (NITROSTAT) 0.4 MG SL tablet Place 0.4 mg under the tongue every 5 (five) minutes as needed. As needed for chest pain.    Historical Provider, MD  pantoprazole (PROTONIX) 40 MG tablet Take 40 mg by mouth daily.    Historical Provider, MD  ranolazine (RANEXA) 1000 MG SR tablet Take 1 tablet (1,000 mg total) by mouth 2 (two) times daily. 06/09/14   Belva Crome, MD  saccharomyces boulardii (FLORASTOR) 250 MG capsule Take 250 mg by mouth 2 (two) times daily.    Historical Provider, MD  timolol (TIMOPTIC-XR) 0.5 % ophthalmic gel-forming Place 1 drop into both eyes daily.    Historical Provider, MD  travoprost, benzalkonium, (TRAVATAN) 0.004 % ophthalmic solution Place 1 drop into both eyes at bedtime.    Historical Provider, MD  traZODone (DESYREL) 50 MG tablet Take 150 mg by mouth at bedtime.    Historical  Provider, MD   BP 121/40 mmHg  Pulse 60  Temp(Src) 97.4 F (36.3 C)  Resp 18  SpO2 99% Physical Exam  Constitutional: She is oriented to person, place, and time. She appears well-developed and well-nourished.  HENT:  Head: Normocephalic and atraumatic.  Right Ear: External ear normal.  Left Ear: External ear normal.  Nose: Nose normal.  Mouth/Throat: Oropharynx is clear and moist.  Eyes: Conjunctivae and EOM are normal. Pupils are equal, round, and reactive to light.  Neck: Normal range of motion. Neck supple.  Cardiovascular: Normal rate, regular rhythm, normal heart sounds and intact distal pulses.  Pulmonary/Chest: Effort normal and breath sounds normal.  Abdominal: Soft. Bowel sounds are normal. There is tenderness.  Epigastric quadrant tenderness to palpation.  Musculoskeletal: Normal range of motion.  Neurological: She is alert and oriented to person, place, and time. She has normal reflexes.  Skin: Skin is warm and dry.  Psychiatric: She has a normal mood and affect. Her behavior is normal. Judgment and thought content normal.  Nursing note and vitals reviewed.   ED Course  Procedures (including critical care time) Labs Review Labs Reviewed  BASIC METABOLIC PANEL - Abnormal; Notable for the following:    Sodium 129 (*)    Chloride 99 (*)    CO2 20 (*)    Calcium 8.8 (*)    GFR calc non Af Amer 51 (*)    GFR calc Af Amer 59 (*)    All other components within normal limits  CBC - Abnormal; Notable for the following:    WBC 18.9 (*)    All other components within normal limits  I-STAT TROPOININ, ED    Imaging Review Dg Chest 2 View  03/19/2015   CLINICAL DATA:  Chest pain, shortness of breath  EXAM: CHEST  2 VIEW  COMPARISON:  07/22/2014  FINDINGS: Mild cardiomegaly noted with central vascular congestion but no overt edema. No focal pulmonary opacity. No pleural effusion. Retained contrast in nondilated bowel. Atheromatous aortic calcification without calcified  aneurysm.  IMPRESSION: Cardiomegaly without focal acute finding.   Electronically Signed   By: Conchita Paris M.D.   On: 03/19/2015 13:53   I have personally reviewed and evaluated these images and lab results as part of my medical decision-making.   EKG Interpretation   Date/Time:  Friday March 19 2015 12:33:07 EDT Ventricular Rate:  58 PR Interval:  182 QRS Duration: 74 QT Interval:  450 QTC Calculation: 441 R Axis:   2 Text Interpretation:  Sinus bradycardia with Premature atrial complexes  Anteroseptal infarct , age undetermined Abnormal ECG Confirmed by Yazir Koerber MD,  Andee Poles [phone]) on 03/19/2015 2:58:38 PM      MDM   Final diagnoses:  Chest pain, unspecified chest pain type  Colitis  Gallstone   81 year old female with upper chest pain that has been severe and radiating to her neck. She has recently had colitis since currently on antibiotics. She describes the pain as sharp and stabbing. It has intermittently resolved. She has an elevated white blood cell count here. Plan chest CT to rule out infection or great vessel involvement. Given her ongoing intermittent pain and elevated white blood cell count, she will likely need admission for observation and ongoing evaluation. She has recently had CT of her abdomen and ultrasound of her abdomen. The CT revealed possible mild ascending colitis and she has been on antibiotics for this. She is not having nausea or vomiting but has had ongoing diarrhea. Ultrasound revealed a density in her gallbladder that could represent a nonmobile stone but no evidence of acute cholecystitis is seen at that time. Liver function tests and lipase are pending. Today's symptoms appear to be separate with upper chest pain, although she is tender to palpation on her abdomen this is not what brought her in. CT chest pending then admission for chest pain.  Discussed with Dr. Roderic Palau , patient, and family and all voice understnding of plan.   Pattricia Boss,  MD 03/20/15 [phone]

## 2015-03-19 NOTE — ED Notes (Signed)
Patient transported to CT 

## 2015-03-19 NOTE — ED Notes (Signed)
Attempted report X1

## 2015-03-20 DIAGNOSIS — D72829 Elevated white blood cell count, unspecified: Secondary | ICD-10-CM

## 2015-03-20 DIAGNOSIS — I5032 Chronic diastolic (congestive) heart failure: Secondary | ICD-10-CM

## 2015-03-20 DIAGNOSIS — J189 Pneumonia, unspecified organism: Principal | ICD-10-CM

## 2015-03-20 DIAGNOSIS — R001 Bradycardia, unspecified: Secondary | ICD-10-CM

## 2015-03-20 DIAGNOSIS — E871 Hypo-osmolality and hyponatremia: Secondary | ICD-10-CM

## 2015-03-20 LAB — CBC
HCT: 37.6 % (ref 36.0–46.0)
HEMOGLOBIN: 12.8 g/dL (ref 12.0–15.0)
MCH: 33 pg (ref 26.0–34.0)
MCHC: 34 g/dL (ref 30.0–36.0)
MCV: 96.9 fL (ref 78.0–100.0)
Platelets: 420 10*3/uL — ABNORMAL HIGH (ref 150–400)
RBC: 3.88 MIL/uL (ref 3.87–5.11)
RDW: 14.3 % (ref 11.5–15.5)
WBC: 12.2 10*3/uL — AB (ref 4.0–10.5)

## 2015-03-20 LAB — BASIC METABOLIC PANEL
ANION GAP: 9 (ref 5–15)
BUN: 13 mg/dL (ref 6–20)
CALCIUM: 8.4 mg/dL — AB (ref 8.9–10.3)
CHLORIDE: 100 mmol/L — AB (ref 101–111)
CO2: 22 mmol/L (ref 22–32)
Creatinine, Ser: 1.09 mg/dL — ABNORMAL HIGH (ref 0.44–1.00)
GFR calc non Af Amer: 45 mL/min — ABNORMAL LOW (ref >60)
GFR, EST AFRICAN AMERICAN: 52 mL/min — AB (ref >60)
Glucose, Bld: 91 mg/dL (ref 65–99)
Potassium: 4.9 mmol/L (ref 3.5–5.1)
SODIUM: 131 mmol/L — AB (ref 135–145)

## 2015-03-20 LAB — MRSA PCR SCREENING: MRSA BY PCR: NEGATIVE

## 2015-03-20 LAB — TROPONIN I
Troponin I: 0.03 ng/mL (ref <0.031)
Troponin I: <0.03 ng/mL (ref <0.031)

## 2015-03-20 MED ORDER — DEXTROSE 5 % IV SOLN
1.0000 g | INTRAVENOUS | Status: DC
  Administered 2015-03-20: 1 g via INTRAVENOUS
  Filled 2015-03-20 (×2): qty 10

## 2015-03-20 MED ORDER — INFLUENZA VAC SPLIT QUAD 0.5 ML IM SUSY
0.5000 mL | PREFILLED_SYRINGE | INTRAMUSCULAR | Status: AC
Start: 2015-03-21 — End: 2015-03-21
  Administered 2015-03-21: 0.5 mL via INTRAMUSCULAR
  Filled 2015-03-20: qty 0.5

## 2015-03-20 MED ORDER — DEXTROSE 5 % IV SOLN
500.0000 mg | INTRAVENOUS | Status: DC
  Administered 2015-03-20: 500 mg via INTRAVENOUS
  Filled 2015-03-20 (×2): qty 500

## 2015-03-20 MED ORDER — HYDRALAZINE HCL 20 MG/ML IJ SOLN: 5.0000 mg | INTRAMUSCULAR | Status: DC | PRN

## 2015-03-20 MED ORDER — METOPROLOL TARTRATE 25 MG PO TABS
25.0000 mg | ORAL_TABLET | Freq: Two times a day (BID) | ORAL | Status: DC
  Administered 2015-03-20 – 2015-03-21 (×3): 25 mg via ORAL
  Filled 2015-03-20 (×3): qty 1

## 2015-03-20 MED ORDER — FERROUS SULFATE 325 (65 FE) MG PO TABS
325.0000 mg | ORAL_TABLET | Freq: Two times a day (BID) | ORAL | Status: DC
  Administered 2015-03-20 – 2015-03-21 (×3): 325 mg via ORAL
  Filled 2015-03-20 (×3): qty 1

## 2015-03-20 NOTE — Progress Notes (Signed)
[facility] PROGRESS NOTE  Ellen Marshall UUE:[id number] DOB: Mar 21, 1926 DOA: 03/19/2015 PCP: Henrine Screws, MD  Assessment/Plan: 1. CAP 1. Afebrile. Leukocytosis improved overnight 2. Pt had been continued on levaquin. Discussed with pharmacy, recs to transition 3. CT findings noted to be likely infectious, however recs to repeat CT in 3 months to ensure resolution to r/o malignant lesion 2. Chest pain 1. Serial trop neg x3 2. Stable. Suspect pain related to presenting PNA 3. Chronic diastolic CHF 1. Stable. Appears to be euvolemic 2. Not on ACEI 4. Hyponatremia 1. Stable 5. Leukocytosis 1. Secondary to CAP 2. Improving 6. CAD 1. Stable 2. Trop neg 3. Normally follows Dr. Tamala Julian 7. Bradycardia 1. Stable. Normal HR 8. HTN 1. BP improved this AM 2. Had increased metoprolol to 25mg  3. If BP remains poorly controlled, would add low dose ACEI, especially given hx of diastolic failure 9. DVT prophylaxis 1. Lovenox subQ  Code Status: Full Family Communication: Pt in room, family at bedside  Disposition Plan: Pending   Consultants:    Procedures:    Antibiotics:  Levaquin 9/2>>>9/3  Azithromycin 9/3>>>  Rocephin 9/3>>>   HPI/Subjective: Feels better. Eager to go home  Objective: Filed Vitals:   03/19/15 2000 03/19/15 2121 03/20/15 0425 03/20/15 1438  BP: 173/61 181/68 155/50 139/40  Pulse: 81 77 75 62  Temp:  97.7 F (36.5 C) 97.3 F (36.3 C) 97.7 F (36.5 C)  TempSrc:  Oral Oral Oral  Resp: 20 18 16 20   Height:  5\' 3"  (1.6 m)    Weight:  62.687 kg (138 lb 3.2 oz) 62.234 kg (137 lb 3.2 oz)   SpO2: 96% 98% 95% 94%    Intake/Output Summary (Last 24 hours) at 03/20/15 1711 Last data filed at 03/20/15 1437  Gross per 24 hour  Intake    240 ml  Output    900 ml  Net   -660 ml   Filed Weights   03/19/15 2121 03/20/15 0425  Weight: 62.687 kg (138 lb 3.2 oz) 62.234 kg (137 lb 3.2 oz)    Exam:   General:  Awake, in nad  Cardiovascular:  regular, s1, s2  Respiratory: normal resp effort, no wheezing  Abdomen: soft,nondistended  Musculoskeletal: perfused, no clubbing   Data Reviewed: Basic Metabolic Panel:  Recent Labs Lab 03/19/15 1316 03/20/15 0251  NA 129* 131*  K 4.6 4.9  CL 99* 100*  CO2 20* 22  GLUCOSE 88 91  BUN 14 13  CREATININE 0.98 1.09*  CALCIUM 8.8* 8.4*   Liver Function Tests:  Recent Labs Lab 03/19/15 1526  AST 42*  ALT 43  ALKPHOS 41  BILITOT 0.6  PROT 6.1*  ALBUMIN 3.5    Recent Labs Lab 03/19/15 1526  LIPASE 39   No results for input(s): AMMONIA in the last 168 hours. CBC:  Recent Labs Lab 03/19/15 1316 03/20/15 0251  WBC 18.9* 12.2*  HGB 13.8 12.8  HCT 40.2 37.6  MCV 95.0 96.9  PLT 314 420*   Cardiac Enzymes:  Recent Labs Lab 03/19/15 2219 03/20/15 0251 03/20/15 0938  TROPONINI <0.03 0.03 <0.03   BNP (last 3 results)  Recent Labs  03/19/15 2219  BNP 232.0*    ProBNP (last 3 results) No results for input(s): PROBNP in the last 8760 hours.  CBG: No results for input(s): GLUCAP in the last 168 hours.  Recent Results (from the past 240 hour(s))  Culture, blood (routine x 2) Call MD if unable to obtain prior to antibiotics being given  Status: None (Preliminary result)   Collection Time: 03/19/15 10:19 PM  Result Value Ref Range Status   Specimen Description BLOOD LEFT ANTECUBITAL  Final   Special Requests BOTTLES DRAWN AEROBIC ONLY 10CC  Final   Culture NO GROWTH < 12 HOURS  Final   Report Status PENDING  Incomplete  Culture, blood (routine x 2) Call MD if unable to obtain prior to antibiotics being given     Status: None (Preliminary result)   Collection Time: 03/19/15 10:30 PM  Result Value Ref Range Status   Specimen Description BLOOD RIGHT HAND  Final   Special Requests BOTTLES DRAWN AEROBIC ONLY 5CC  Final   Culture NO GROWTH < 12 HOURS  Final   Report Status PENDING  Incomplete  MRSA PCR Screening     Status: None   Collection Time:  03/20/15  2:23 AM  Result Value Ref Range Status   MRSA by PCR NEGATIVE NEGATIVE Final    Comment:        The GeneXpert MRSA Assay (FDA approved for NASAL specimens only), is one component of a comprehensive MRSA colonization surveillance program. It is not intended to diagnose MRSA infection nor to guide or monitor treatment for MRSA infections.      Studies: Dg Chest 2 View  03/19/2015   CLINICAL DATA:  Chest pain, shortness of breath  EXAM: CHEST  2 VIEW  COMPARISON:  07/22/2014  FINDINGS: Mild cardiomegaly noted with central vascular congestion but no overt edema. No focal pulmonary opacity. No pleural effusion. Retained contrast in nondilated bowel. Atheromatous aortic calcification without calcified aneurysm.  IMPRESSION: Cardiomegaly without focal acute finding.   Electronically Signed   By: Conchita Paris M.D.   On: 03/19/2015 13:53   Ct Chest W Contrast  03/19/2015   CLINICAL DATA:  81 year old female with sharp stabbing chest pain since this morning, increasing with breathing.  EXAM: CT CHEST WITH CONTRAST  TECHNIQUE: Multidetector CT imaging of the chest was performed during intravenous contrast administration.  CONTRAST:  34mL OMNIPAQUE IOHEXOL 300 MG/ML  SOLN  COMPARISON:  No priors.  FINDINGS: Mediastinum/Lymph Nodes: Heart size is mildly enlarged. There is no significant pericardial fluid, thickening or pericardial calcification. There is atherosclerosis of the thoracic aorta, the great vessels of the mediastinum and the coronary arteries, including calcified atherosclerotic plaque in the left main, left anterior descending and right coronary arteries. Mild calcifications of the mitral annulus. No pathologically enlarged mediastinal or hilar lymph nodes. Esophagus is unremarkable in appearance. No axillary lymphadenopathy.  Lungs/Pleura: 8 x 13 mm ground-glass attenuation nodule in the lateral segment of the right middle lobe (image 26 of series 3). 4 mm subpleural nodule in the  medial aspect of the left lower lobe (image 34 of series 3), unchanged. No acute consolidative airspace disease. No pleural effusions. Areas of dependent subsegmental atelectasis in the lower lobes of the lungs bilaterally.  Upper Abdomen: Atherosclerosis.  Musculoskeletal/Soft Tissues: There are no aggressive appearing lytic or blastic lesions noted in the visualized portions of the skeleton.  IMPRESSION: 1. 8 x 13 mm ground-glass attenuation nodule in the lateral segment of the right middle lobe. This is favored to be infectious or inflammatory, however, neoplasm is not excluded. Initial follow-up by chest CT without contrast is recommended in 3 months to confirm persistence. This recommendation follows the consensus statement: Recommendations for the Management of Subsolid Pulmonary Nodules Detected at CT: A Statement from the [place] as published in Radiology 2013; 266:304-317. 2. No other potentially acute findings  noted in the thorax on today's examination. 3. Atherosclerosis, including left main and 2 vessel coronary artery disease. 4. Additional incidental findings, as above.   Electronically Signed   By: Vinnie Langton M.D.   On: 03/19/2015 18:09    Scheduled Meds: . ALPRAZolam  0.25 mg Oral BID  . aspirin  325 mg Oral Daily  . azithromycin  500 mg Intravenous Q24H  . calcium-vitamin D  1 tablet Oral Daily  . cefTRIAXone (ROCEPHIN)  IV  1 g Intravenous Q24H  . clopidogrel  75 mg Oral QPC lunch  . diltiazem  120 mg Oral Daily  . enoxaparin (LOVENOX) injection  30 mg Subcutaneous Q24H  . escitalopram  20 mg Oral q morning - 10a  . ferrous sulfate  325 mg Oral BID WC  . folic acid  1 mg Oral Daily  . [START ON 03/21/2015] Influenza vac split quadrivalent PF  0.5 mL Intramuscular Tomorrow-1000  . latanoprost  1 drop Both Eyes QHS  . loratadine  10 mg Oral Daily  . metoprolol tartrate  25 mg Oral BID  . nitroGLYCERIN  1 inch Topical 4 times per day  . pantoprazole  40 mg Oral Daily   . ranolazine  500 mg Oral Daily  . saccharomyces boulardii  250 mg Oral Daily  . sodium chloride  3 mL Intravenous Q12H  . sodium chloride  3 mL Intravenous Q12H  . timolol  1 drop Both Eyes Daily  . traZODone  150 mg Oral QHS   Continuous Infusions:   Principal Problem:   CAP (community acquired pneumonia) Active Problems:   Hyponatremia   Leukocytosis   CAD (coronary artery disease)   Bradycardia   HTN (hypertension)   Chest pain   Chronic diastolic heart failure    Lynelle Weiler K  [facility] Pager [phone]. If 7PM-7AM, please contact night-coverage at [email or web address], password [email or web address] 03/20/2015, 5:11 PM  LOS: 1 day

## 2015-03-21 DIAGNOSIS — J189 Pneumonia, unspecified organism: Secondary | ICD-10-CM | POA: Diagnosis not present

## 2015-03-21 DIAGNOSIS — I10 Essential (primary) hypertension: Secondary | ICD-10-CM

## 2015-03-21 LAB — CBC
HEMATOCRIT: 36.6 % (ref 36.0–46.0)
HEMOGLOBIN: 12.2 g/dL (ref 12.0–15.0)
MCH: 31.9 pg (ref 26.0–34.0)
MCHC: 33.3 g/dL (ref 30.0–36.0)
MCV: 95.6 fL (ref 78.0–100.0)
Platelets: 415 10*3/uL — ABNORMAL HIGH (ref 150–400)
RBC: 3.83 MIL/uL — ABNORMAL LOW (ref 3.87–5.11)
RDW: 14.2 % (ref 11.5–15.5)
WBC: 10.4 10*3/uL (ref 4.0–10.5)

## 2015-03-21 LAB — BASIC METABOLIC PANEL
ANION GAP: 7 (ref 5–15)
BUN: 10 mg/dL (ref 6–20)
CHLORIDE: 101 mmol/L (ref 101–111)
CO2: 25 mmol/L (ref 22–32)
Calcium: 8.5 mg/dL — ABNORMAL LOW (ref 8.9–10.3)
Creatinine, Ser: 0.95 mg/dL (ref 0.44–1.00)
GFR calc Af Amer: >60 mL/min (ref >60)
GFR calc non Af Amer: 53 mL/min — ABNORMAL LOW (ref >60)
GLUCOSE: 96 mg/dL (ref 65–99)
POTASSIUM: 3.8 mmol/L (ref 3.5–5.1)
Sodium: 133 mmol/L — ABNORMAL LOW (ref 135–145)

## 2015-03-21 MED ORDER — CEFUROXIME AXETIL 250 MG PO TABS: 250.0000 mg | ORAL_TABLET | Freq: Two times a day (BID) | ORAL | Status: DC

## 2015-03-21 MED ORDER — CEFUROXIME AXETIL 500 MG PO TABS: 250.0000 mg | ORAL_TABLET | Freq: Two times a day (BID) | ORAL | Status: DC

## 2015-03-21 MED ORDER — ENOXAPARIN SODIUM 40 MG/0.4ML ~~LOC~~ SOLN: 40.0000 mg | SUBCUTANEOUS | Status: DC

## 2015-03-21 MED ORDER — AZITHROMYCIN 250 MG PO TABS
250.0000 mg | ORAL_TABLET | Freq: Every day | ORAL | Status: DC
  Administered 2015-03-21: 250 mg via ORAL
  Filled 2015-03-21: qty 1

## 2015-03-21 MED ORDER — AZITHROMYCIN 250 MG PO TABS: ORAL_TABLET | ORAL | Status: DC

## 2015-03-21 NOTE — Evaluation (Signed)
Physical Therapy Evaluation Patient Details Name: Ellen Marshall MRN: [id number] DOB: [birth year] Today's Date: 03/21/2015   History of Present Illness  Ellen Marshall is a 81 y.o. female with a history of CAD, Chronic Diastolic CHF, HTN and recent Colitis who presents to the ED with complaints of Sharp Chest Pain across her chest radiating into her neck and face since 10 AM.Xray revealed pnemonia  Clinical Impression  Pt with generalized deconditioning and quick onset of fatigue. [facility] from indep living at [facility] but going home with son for a little while who can provide 24/7 assist. Recommend HHPT to progress pt to safe mod I level of functioning and improve pt strength and activity tolerance.    Follow Up Recommendations Home health PT;Supervision/Assistance - 24 hour    Equipment Recommendations  None recommended by PT    Recommendations for Other Services       Precautions / Restrictions Precautions Precautions: Fall (legally blind) Restrictions Weight Bearing Restrictions: No      Mobility  Bed Mobility Overal bed mobility: Needs Assistance Bed Mobility: Rolling;Sidelying to Sit Rolling: Supervision Sidelying to sit: Supervision       General bed mobility comments: pt with definite use of bedrail  Transfers Overall transfer level: Needs assistance Equipment used: Rolling walker (2 wheeled) Transfers: Sit to/from Stand Sit to Stand: Min guard         General transfer comment: v/c's for safe hand placement, push up from bed and reach back for chair/bed  Ambulation/Gait Ambulation/Gait assistance: Min guard Ambulation Distance (Feet): 100 Feet Assistive device: Rolling walker (2 wheeled) Gait Pattern/deviations: Step-to pattern;Decreased stride length Gait velocity: decrased   General Gait Details: pt steady with RW,c/o onset of fatigue  Stairs            Wheelchair Mobility    Modified Rankin (Stroke Patients Only)       Balance Overall  balance assessment: Needs assistance Sitting-balance support: Feet supported;No upper extremity supported Sitting balance-Leahy Scale: Good     Standing balance support: Bilateral upper extremity supported Standing balance-Leahy Scale: Poor Standing balance comment: requires RW for safe ambulation                             Pertinent Vitals/Pain Pain Assessment: No/denies pain    Home Living Family/patient expects to be discharged to:: Private residence Living Arrangements: Alone ([facility] but is going home with son) Available Help at Discharge: Family;Available 24 hours/day Type of Home: House Home Access: Level entry     Home Layout: One level Home Equipment: Walker - 4 wheels;Toilet riser      Prior Function Level of Independence: Needs assistance   Gait / Transfers Assistance Needed: uses 4ww  ADL's / Homemaking Assistance Needed: mod I  Comments: all meals are prepared and pt goes to [employer] or she can have them delivered     Hand Dominance   Dominant Hand: Right    Extremity/Trunk Assessment   Upper Extremity Assessment: Generalized weakness           Lower Extremity Assessment: Generalized weakness      Cervical / Trunk Assessment: Normal  Communication   Communication: No difficulties  Cognition Arousal/Alertness: Awake/alert Behavior During Therapy: WFL for tasks assessed/performed Overall Cognitive Status: Within Functional Limits for tasks assessed                      General Comments  Exercises        Assessment/Plan    PT Assessment Patient needs continued PT services  PT Diagnosis Difficulty walking   PT Problem List Decreased strength;Decreased activity tolerance;Decreased balance;Decreased mobility  PT Treatment Interventions DME instruction;Gait training;Functional mobility training;Therapeutic activities;Therapeutic exercise;Balance training   PT Goals (Current goals can be found  in the Care Plan section) Acute Rehab PT Goals Patient Stated Goal: home PT Goal Formulation: With patient Time For Goal Achievement: 03/28/15 Potential to Achieve Goals: Good    Frequency Min 3X/week   Barriers to discharge        Co-evaluation               End of Session Equipment Utilized During Treatment: Gait belt Activity Tolerance: Patient tolerated treatment well Patient left: in chair;with call bell/phone within reach;with family/visitor present Nurse Communication: Mobility status         Time: 7124-5809 PT Time Calculation (min) (ACUTE ONLY): 23 min   Charges:   PT Evaluation $Initial PT Evaluation Tier I: 1 Procedure PT Treatments $Gait Training: 8-22 mins   PT G CodesKingsley Callander 03/21/2015, 2:40 PM  Kittie Plater, PT, DPT Pager #: [phone] Office #: [phone]

## 2015-03-21 NOTE — Discharge Summary (Signed)
Physician Discharge Summary  Ellen Marshall TDV:[id number] DOB: [birth year] DOA: 03/19/2015  PCP: Henrine Screws, MD  Admit date: 03/19/2015 Discharge date: 03/21/2015  Time spent: 20 minutes  Recommendations for Outpatient Follow-up:  1. Follow up with PCP in 1-2 weeks 2. Follow up with Dr. Tamala Julian as scheduled 3. Please repeat CT chest without contrast in 3 months  Discharge Diagnoses:  Principal Problem:   CAP (community acquired pneumonia) Active Problems:   Hyponatremia   Leukocytosis   CAD (coronary artery disease)   Bradycardia   HTN (hypertension)   Chest pain   Chronic diastolic heart failure   Discharge Condition: Improved  Diet recommendation: Heart healthy diet  Filed Weights   03/19/15 2121 03/20/15 0425 03/21/15 0552  Weight: 62.687 kg (138 lb 3.2 oz) 62.234 kg (137 lb 3.2 oz) 61.916 kg (136 lb 8 oz)    History of present illness:  Please see admit h and p from 9/2 for details. Briefly, pt presented with chest pains. She was recently treated for colitis with 2 weeks of abx.  On presentation, pt was found to have imaging findings worrisome for PNA. Patient was admitted for further work up.  Hospital Course:  1. CAP 1. Afebrile. Leukocytosis improved overnight 2. Pt was initially continued on levaquin. Had discussed with pharmacy with recs to transition to azithromycin and rocephin 3. CT findings noted to be likely infectious, however recs to repeat CT in 3 months to ensure resolution to r/o malignant lesion 4. Patient will complete course with PO azithromycin with ceftin 2. Chest pain 1. Serial trop neg x3 2. Stable. Suspect pain related to presenting PNA 3. Chronic diastolic CHF 1. Stable. Appears to be euvolemic 2. Not on ACEI. Will defer to primary Cardiologist if patient would benefit from low dose ACEI 4. Hyponatremia 1. Stable 5. Leukocytosis 1. Secondary to CAP 2. Improving 6. CAD 1. Stable 2. Trop neg 3. Normally follows Dr.  Tamala Julian 7. Bradycardia 1. Stable. Normal HR 8. HTN 1. BP improved this AM 2. On beta blocker 9. DVT prophylaxis 1. Lovenox subQ while inpatient  Discharge Exam: Filed Vitals:   03/20/15 1438 03/20/15 2150 03/21/15 0552 03/21/15 1359  BP: 139/40 138/49 146/37 144/38  Pulse: 62 70 64 62  Temp: 97.7 F (36.5 C) 97.7 F (36.5 C) 97.8 F (36.6 C) 97.9 F (36.6 C)  TempSrc: Oral Oral Oral Oral  Resp: 20 18 18 20   Height:      Weight:   61.916 kg (136 lb 8 oz)   SpO2: 94% 97% 98% 95%    General: Awake, in nad Cardiovascular: regular, s1, s2 Respiratory: normal resp effort, no wheezing  Discharge Instructions     Medication List    STOP taking these medications        cephALEXin 500 MG capsule  Commonly known as:  KEFLEX      TAKE these medications        Acetaminophen 650 MG Tabs  Take 1,300 mg by mouth 2 (two) times daily.     ALPRAZolam 0.25 MG tablet  Commonly known as:  XANAX  Take 0.25 mg by mouth 2 (two) times daily. As needed for anxiety. Takes at 2pm and bedtime     aspirin EC 81 MG tablet  Take 1 tablet (81 mg total) by mouth daily.     azithromycin 250 MG tablet  Commonly known as:  ZITHROMAX  1 tab PO qday x 5 days, then stop, zero refills     Calcium Carbonate-Vitamin D  600-200 MG-UNIT Tabs  Take 1 tablet by mouth daily.     cefUROXime 250 MG tablet  Commonly known as:  CEFTIN  Take 1 tablet (250 mg total) by mouth 2 (two) times daily with a meal.     CENTRUM SILVER PO  Take 1 tablet by mouth daily at 12 noon.     clopidogrel 75 MG tablet  Commonly known as:  PLAVIX  Take 75 mg by mouth daily after lunch.     diltiazem 120 MG 24 hr capsule  Commonly known as:  DILACOR XR  Take 120 mg by mouth daily.     diphenoxylate-atropine 2.5-0.025 MG per tablet  Commonly known as:  LOMOTIL  Take 2 tablets by mouth 4 (four) times daily as needed for diarrhea or loose stools.     escitalopram 20 MG tablet  Commonly known as:  LEXAPRO  Take 20 mg  by mouth every morning.     ferrous sulfate 325 (65 FE) MG tablet  Take 325 mg by mouth 2 (two) times daily with a meal.     fexofenadine 180 MG tablet  Commonly known as:  ALLEGRA  Take 180 mg by mouth daily.     folic acid 326 MCG tablet  Commonly known as:  FOLVITE  Take 800 mcg by mouth daily.     isosorbide mononitrate 120 MG 24 hr tablet  Commonly known as:  IMDUR  Take 1 tablet (120 mg total) by mouth daily.     Lutein 20 MG Tabs  Take 1 tablet by mouth every morning.     metoprolol tartrate 25 MG tablet  Commonly known as:  LOPRESSOR  Take 12.5 mg by mouth 2 (two) times daily.     nitroGLYCERIN 0.4 MG SL tablet  Commonly known as:  NITROSTAT  Place 0.4 mg under the tongue every 5 (five) minutes as needed. As needed for chest pain.     pantoprazole 40 MG tablet  Commonly known as:  PROTONIX  Take 40 mg by mouth daily.     ranolazine 500 MG 12 hr tablet  Commonly known as:  RANEXA  Take 500 mg by mouth daily.     saccharomyces boulardii 250 MG capsule  Commonly known as:  FLORASTOR  Take 250 mg by mouth daily.     timolol 0.5 % ophthalmic gel-forming  Commonly known as:  TIMOPTIC-XR  Place 1 drop into both eyes daily.     travoprost (benzalkonium) 0.004 % ophthalmic solution  Commonly known as:  TRAVATAN  Place 1 drop into both eyes at bedtime.     traZODone 50 MG tablet  Commonly known as:  DESYREL  Take 150 mg by mouth at bedtime.       Allergies  Allergen Reactions  . Adhesive [Tape] Other (See Comments)    Burning, Itching.  . Amoxicillin Diarrhea  . Pregabalin     Weakness, diarrhea   . Simvastatin     Myalgias   . Sulfa Antibiotics Itching   Follow-up Information    Follow up with GATES,ROBERT NEVILL, MD. Schedule an appointment as soon as possible for a visit in 1 week.   Specialty:  Internal Medicine   Why:  Hospital follow up   Contact information:   [address] [place] [address] [phone]       Follow  up with Sinclair Grooms, MD On 04/01/2015.   Specialty:  Cardiology   Why:  follow up as already scheduledAppt. @ 10;30am  Contact information:   [address] [place] [address] [phone]        The results of significant diagnostics from this hospitalization (including imaging, microbiology, ancillary and laboratory) are listed below for reference.    Significant Diagnostic Studies: Dg Chest 2 View  03/19/2015   CLINICAL DATA:  Chest pain, shortness of breath  EXAM: CHEST  2 VIEW  COMPARISON:  07/22/2014  FINDINGS: Mild cardiomegaly noted with central vascular congestion but no overt edema. No focal pulmonary opacity. No pleural effusion. Retained contrast in nondilated bowel. Atheromatous aortic calcification without calcified aneurysm.  IMPRESSION: Cardiomegaly without focal acute finding.   Electronically Signed   By: Conchita Paris M.D.   On: 03/19/2015 13:53   Ct Chest W Contrast  03/19/2015   CLINICAL DATA:  81 year old female with sharp stabbing chest pain since this morning, increasing with breathing.  EXAM: CT CHEST WITH CONTRAST  TECHNIQUE: Multidetector CT imaging of the chest was performed during intravenous contrast administration.  CONTRAST:  54mL OMNIPAQUE IOHEXOL 300 MG/ML  SOLN  COMPARISON:  No priors.  FINDINGS: Mediastinum/Lymph Nodes: Heart size is mildly enlarged. There is no significant pericardial fluid, thickening or pericardial calcification. There is atherosclerosis of the thoracic aorta, the great vessels of the mediastinum and the coronary arteries, including calcified atherosclerotic plaque in the left main, left anterior descending and right coronary arteries. Mild calcifications of the mitral annulus. No pathologically enlarged mediastinal or hilar lymph nodes. Esophagus is unremarkable in appearance. No axillary lymphadenopathy.  Lungs/Pleura: 8 x 13 mm ground-glass attenuation nodule in the lateral segment of the right middle lobe (image  26 of series 3). 4 mm subpleural nodule in the medial aspect of the left lower lobe (image 34 of series 3), unchanged. No acute consolidative airspace disease. No pleural effusions. Areas of dependent subsegmental atelectasis in the lower lobes of the lungs bilaterally.  Upper Abdomen: Atherosclerosis.  Musculoskeletal/Soft Tissues: There are no aggressive appearing lytic or blastic lesions noted in the visualized portions of the skeleton.  IMPRESSION: 1. 8 x 13 mm ground-glass attenuation nodule in the lateral segment of the right middle lobe. This is favored to be infectious or inflammatory, however, neoplasm is not excluded. Initial follow-up by chest CT without contrast is recommended in 3 months to confirm persistence. This recommendation follows the consensus statement: Recommendations for the Management of Subsolid Pulmonary Nodules Detected at CT: A Statement from the [employer] as published in Radiology 2013; 266:304-317. 2. No other potentially acute findings noted in the thorax on today's examination. 3. Atherosclerosis, including left main and 2 vessel coronary artery disease. 4. Additional incidental findings, as above.   Electronically Signed   By: Vinnie Langton M.D.   On: 03/19/2015 18:09   US Abdomen Complete  03/09/2015   CLINICAL DATA:  Abdominal pain.  EXAM: ULTRASOUND ABDOMEN COMPLETE  COMPARISON:  None.  FINDINGS: Gallbladder: 3 mm calcific density noted along the anterior gallbladder wall. This is non mobile. This most likely represents an adherent tiny stone. No gallbladder wall thickening. Negative Murphy sign. No pericholecystic fluid collection.  Common bile duct: Diameter: 3.7 mm  Liver: Within normal limits in parenchymal echogenicity. 9.4 mm hyperechoic lesion left hepatic lobe most consistent with a small hemangioma. If need be gadolinium-enhanced abdominal MRI can be obtained for confirmation .  IVC: No abnormality visualized.  Pancreas: Visualized portion unremarkable.   Spleen: Size and appearance within normal limits.  Right Kidney: Length: 10.3 cm. Renal cortical thinning. Echogenicity within normal  limits. No hydronephrosis visualized. 9 mm simple cyst.  Left Kidney: Length: 9.5 cm. Renal cortical thinning . Echogenicity within normal limits. No mass or hydronephrosis visualized.  Abdominal aorta: No aneurysm visualized.  Other findings: None.  IMPRESSION: 1. 3 mm calcific density noted along the anterior gallbladder wall. This is non mobile. This most likely represents an adherent tiny gallstone. No evidence of cholecystitis.  2. 9.4 mm small well-circumscribed hyperechoic left hepatic lobe nodular density most consistent with benign hemangioma.  3. Bilateral renal cortical thinning. 9 mm simple cyst right kidney.   Electronically Signed   By: Marcello Moores  Register   On: 03/09/2015 09:54   Ct Abdomen Pelvis W Contrast  03/17/2015   CLINICAL DATA:  Bilateral lower quadrant abdominal pain for 2 weeks, right greater than left. Diarrhea and nausea.  EXAM: CT ABDOMEN AND PELVIS WITH CONTRAST  TECHNIQUE: Multidetector CT imaging of the abdomen and pelvis was performed using the standard protocol following bolus administration of intravenous contrast.  CONTRAST:  153mL ISOVUE-300 IOPAMIDOL (ISOVUE-300) INJECTION 61%  COMPARISON:  None.  FINDINGS: Lower chest: The lung bases are clear of acute process. No pleural effusion or worrisome pulmonary lesions. The heart is borderline enlarged. No pericardial effusion. Three-vessel coronary artery calcifications are noted. The distal esophagus is grossly normal.  Hepatobiliary: There are 2 small liver lesions. There is a 8 mm enhancing weight shaped peripheral lesion in segment 8 which is most likely a small vascular shunt. A flash filling hemangioma is also possible. There is a 6 mm lesion in segment 2 on image number 17 which is most likely a benign hemangioma. The gallbladder is normal. No common bile duct dilatation.  Pancreas: Mild  pancreatic atrophy but no mass or inflammation.  Spleen: Normal size.  No focal lesions.  Adrenals/Urinary Tract: The adrenal glands are normal. There are scarring changes involving both kidneys and small cysts. No hydronephrosis or worrisome renal lesions. No obstructing ureteral calculi or bladder calculi.  Stomach/Bowel: The stomach, duodenum and small bowel are unremarkable. No inflammatory changes, mass lesions or obstructive findings. Mild apparent wall thickening of the ascending colon could suggest mild ascending colitis. No mass or obstructive findings. The remainder of the colon is normal. The terminal ileum is normal. The appendix is normal.  Vascular/Lymphatic: No mesenteric or retroperitoneal mass or adenopathy. Small scattered lymph nodes are noted. There are advanced atherosclerotic calcifications involving the aorta and branch vessels but no focal aneurysm or dissection.  Other: The uterus is surgically absent. Both ovaries are still present and appear normal. The bladder is normal. Areas noted in the vagina. No pelvic mass or adenopathy. No free pelvic fluid collections. No inguinal mass or adenopathy.  Musculoskeletal: No significant bony findings. Moderate facet disease noted in the lumbar spine.  IMPRESSION: 1. Possible mild ascending colitis. 2. Two small benign-appearing liver lesions. 3. Scarring changes and small cysts involving both kidneys. 4. Advanced atherosclerotic calcifications involving the aorta and branch vessels.   Electronically Signed   By: Marijo Sanes M.D.   On: 03/17/2015 09:26    Microbiology: Recent Results (from the past 240 hour(s))  Culture, blood (routine x 2) Call MD if unable to obtain prior to antibiotics being given     Status: None (Preliminary result)   Collection Time: 03/19/15 10:19 PM  Result Value Ref Range Status   Specimen Description BLOOD LEFT ANTECUBITAL  Final   Special Requests BOTTLES DRAWN AEROBIC ONLY 10CC  Final   Culture NO GROWTH 2 DAYS   Final  Report Status PENDING  Incomplete  Culture, blood (routine x 2) Call MD if unable to obtain prior to antibiotics being given     Status: None (Preliminary result)   Collection Time: 03/19/15 10:30 PM  Result Value Ref Range Status   Specimen Description BLOOD RIGHT HAND  Final   Special Requests BOTTLES DRAWN AEROBIC ONLY 5CC  Final   Culture NO GROWTH 2 DAYS  Final   Report Status PENDING  Incomplete  MRSA PCR Screening     Status: None   Collection Time: 03/20/15  2:23 AM  Result Value Ref Range Status   MRSA by PCR NEGATIVE NEGATIVE Final    Comment:        The GeneXpert MRSA Assay (FDA approved for NASAL specimens only), is one component of a comprehensive MRSA colonization surveillance program. It is not intended to diagnose MRSA infection nor to guide or monitor treatment for MRSA infections.      Labs: Basic Metabolic Panel:  Recent Labs Lab 03/19/15 1316 03/20/15 0251 03/21/15 0431  NA 129* 131* 133*  K 4.6 4.9 3.8  CL 99* 100* 101  CO2 20* 22 25  GLUCOSE 88 91 96  BUN 14 13 10   CREATININE 0.98 1.09* 0.95  CALCIUM 8.8* 8.4* 8.5*   Liver Function Tests:  Recent Labs Lab 03/19/15 1526  AST 42*  ALT 43  ALKPHOS 41  BILITOT 0.6  PROT 6.1*  ALBUMIN 3.5    Recent Labs Lab 03/19/15 1526  LIPASE 39   No results for input(s): AMMONIA in the last 168 hours. CBC:  Recent Labs Lab 03/19/15 1316 03/20/15 0251 03/21/15 0431  WBC 18.9* 12.2* 10.4  HGB 13.8 12.8 12.2  HCT 40.2 37.6 36.6  MCV 95.0 96.9 95.6  PLT 314 420* 415*   Cardiac Enzymes:  Recent Labs Lab 03/19/15 2219 03/20/15 0251 03/20/15 0938  TROPONINI <0.03 0.03 <0.03   BNP: BNP (last 3 results)  Recent Labs  03/19/15 2219  BNP 232.0*    ProBNP (last 3 results) No results for input(s): PROBNP in the last 8760 hours.  CBG: No results for input(s): GLUCAP in the last 168 hours.  Signed:  CHIU, STEPHEN K  [facility] 03/21/2015, 4:59 PM

## 2015-03-24 LAB — CULTURE, BLOOD (ROUTINE X 2)
CULTURE: NO GROWTH
CULTURE: NO GROWTH

## 2015-03-24 NOTE — Care Management Note (Signed)
Case Management Note  Patient Details  Name: Ellen Marshall MRN: [id number] Date of Birth: 1926-06-13  Action/Plan: Received message that patient was discharged home without [facility] arrangements. TCT patient's son Abbe Amsterdam [phone]; Patient is staying with her son for a few weeks prior to returning to [facility]. Patient use [facility] at the facility and they do not do home visits. HHC choice offered to Abbe Amsterdam and he chose [facility] for home health care needs. Marie with [facility] called for arrangements and will contact the son also.                  Expected Discharge Plan:  [place]     Choice offered to:  Adult Children    HH Arranged:  PT [place] Agency:  [facility]  Status of Service:  Completed, signed off  Sherrilyn Rist [phone] 03/24/2015, 2:48 PM

## 2015-03-27 IMAGING — CT CT HEAD W/O CM
1 series · 16 of 30 positions shown, 20 images · non-contrast
Comparison: August 12, 2013

CLINICAL DATA: Acute onset headache with fatigue and changes in
vision

EXAM:
CT HEAD WITHOUT CONTRAST
TECHNIQUE: Contiguous axial images were obtained from the base of the skull
through the vertex without intravenous contrast.

[Series 2: head_seq -c 4.5 h37s st · axial · 0.43mm/px · z∈[-176,-32]mm · 16 of 36 slices shown, 20 images]
[im 2/36  brain]
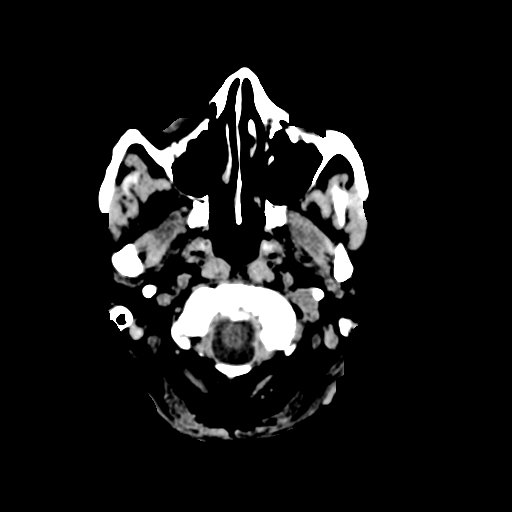
[im 2/36  bone]
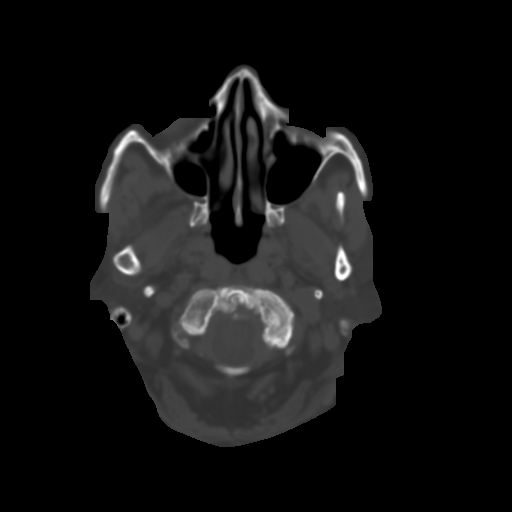
[im 4/36  brain]
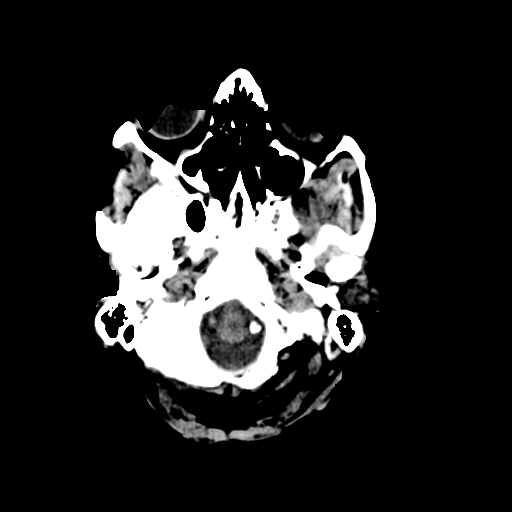
[im 7/36  brain]
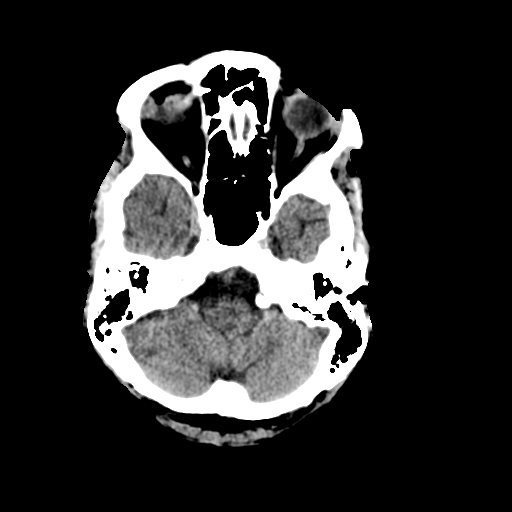
[im 9/36  brain]
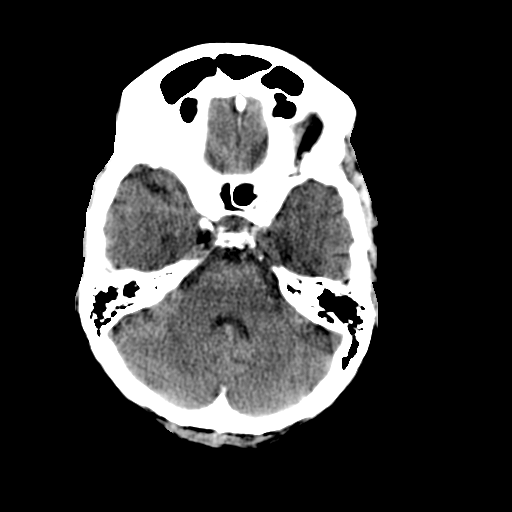
[im 10/36  brain]
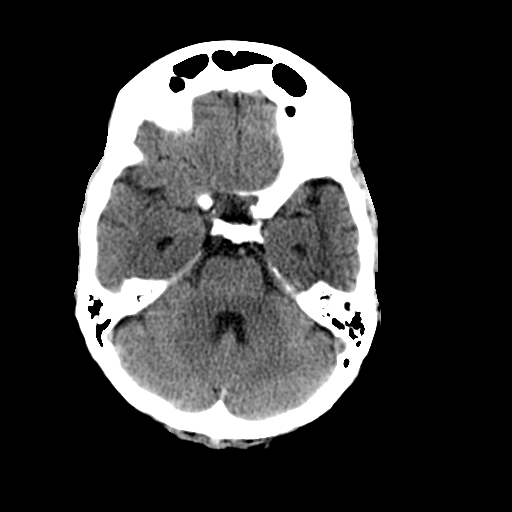
[im 10/36  bone]
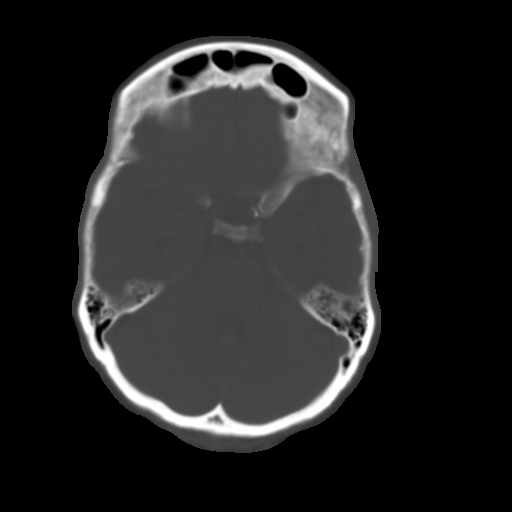
[im 13/36  brain]
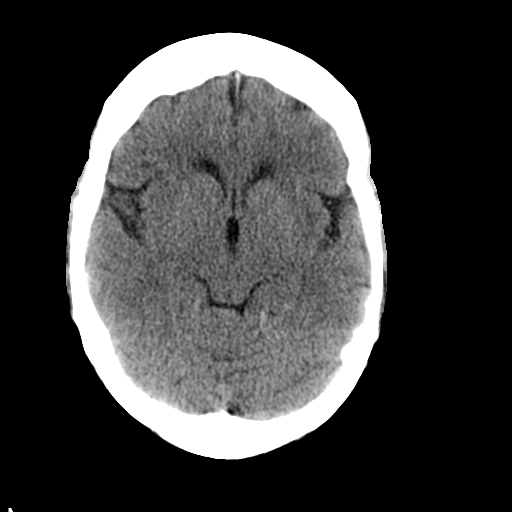
[im 15/36  brain]
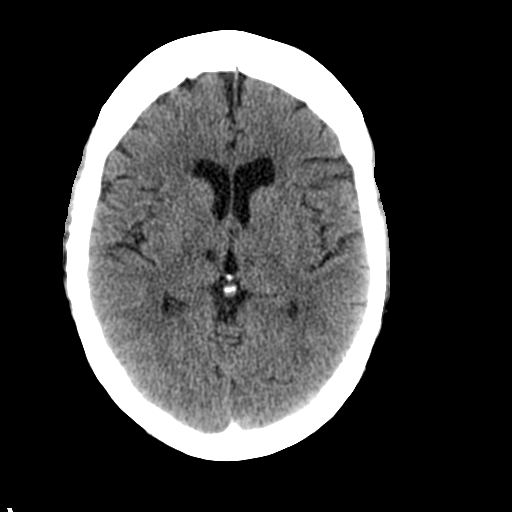
[im 17/36  brain]
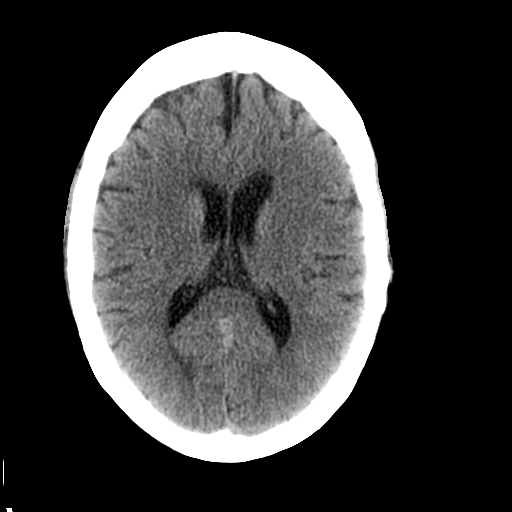
[im 19/36  brain]
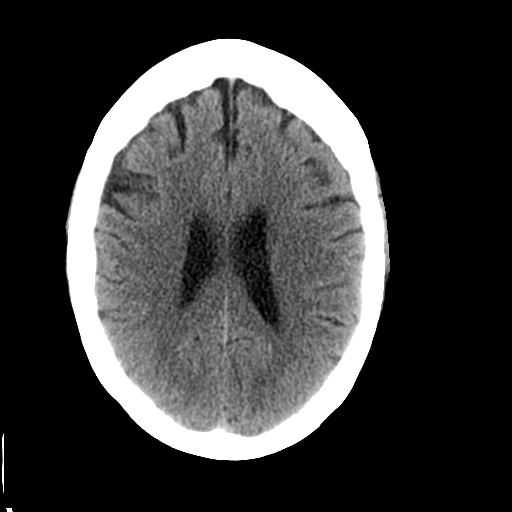
[im 19/36  bone]
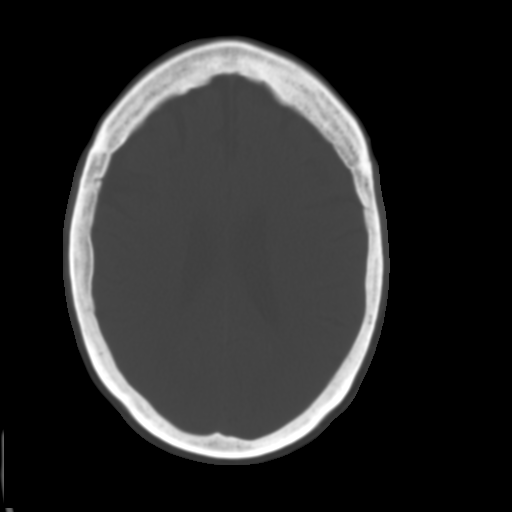
[im 21/36  brain]
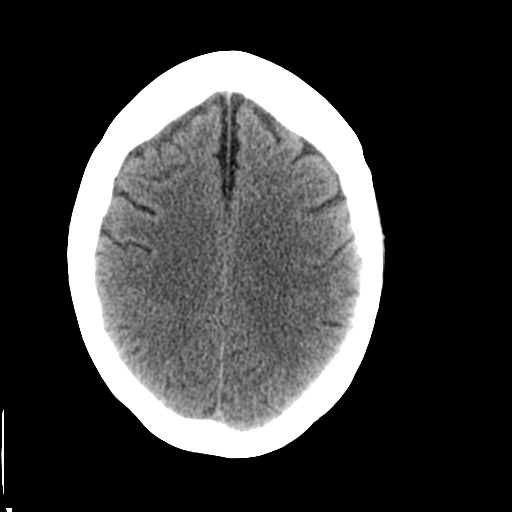
[im 23/36  brain]
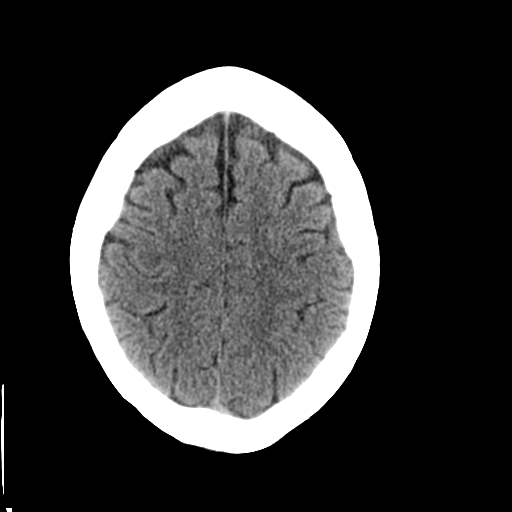
[im 26/36  brain]
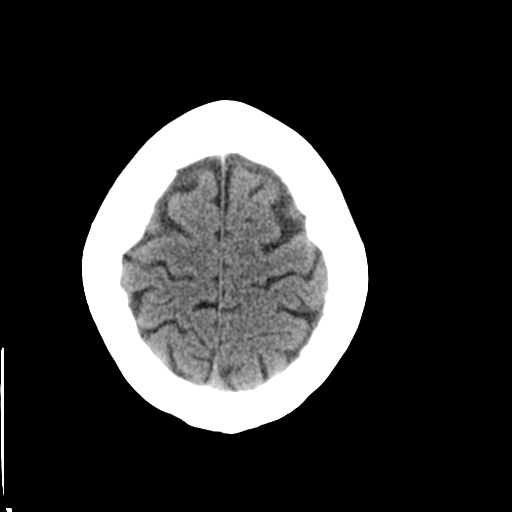
[im 27/36  brain]
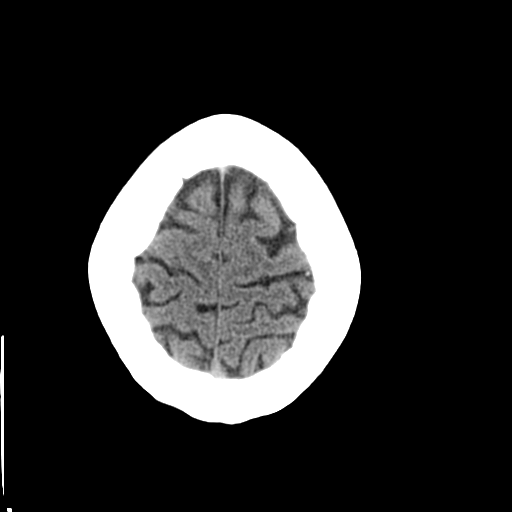
[im 27/36  bone]
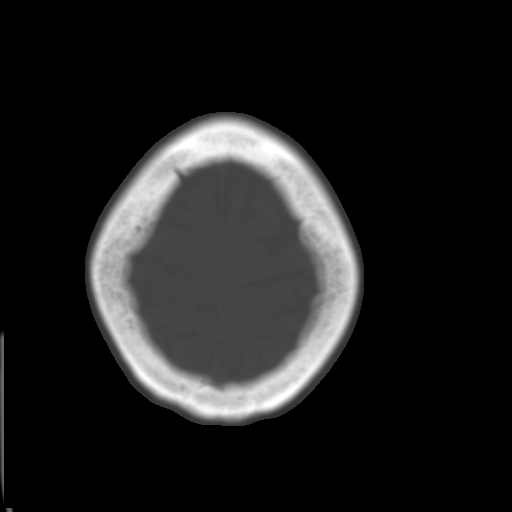
[im 29/36  brain]
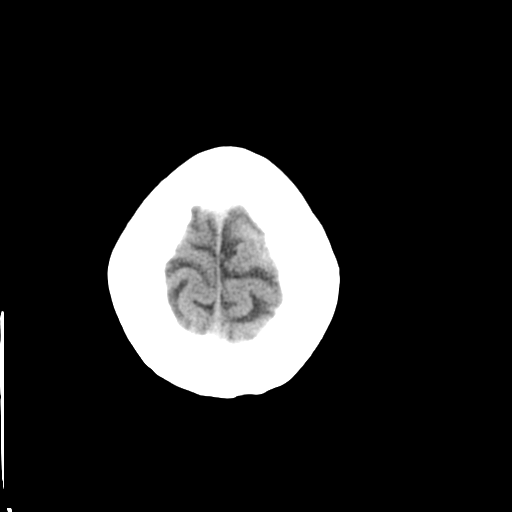
[im 32/36  brain]
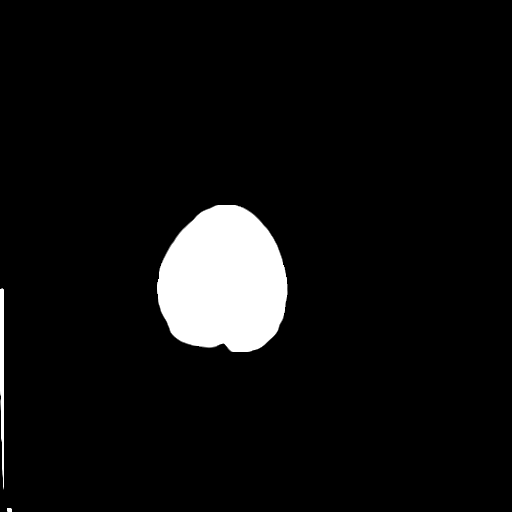
[im 34/36  brain]
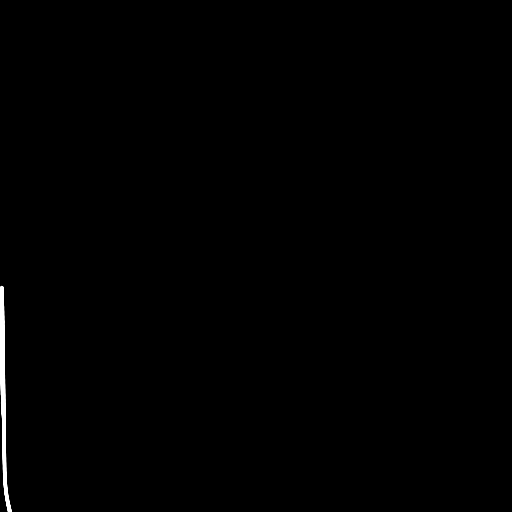

[16 of 30 positions shown; findings below may reference images not displayed]

FINDINGS: There is mild generalized atrophy, stable. There is no demonstrable
mass, hemorrhage, extra-axial fluid collection, or midline shift.
There is no mass, hemorrhage, extra-axial fluid collection, or
midline shift. There is evidence of a prior small lacunar infarct in
the medial right thalamus, stable. There is rather minimal small
vessel disease in the centra semiovale bilaterally. No new
gray-white compartment lesion. No demonstrable acute infarct. Bony
calvarium appears intact. The mastoid air cells are clear.
IMPRESSION: Mild atrophy with minimal periventricular small vessel disease.
Prior small lacunar infarct right thalamus medially. No intracranial
mass, hemorrhage, or acute appearing infarct.

## 2015-03-29 DIAGNOSIS — I639 Cerebral infarction, unspecified: Secondary | ICD-10-CM | POA: Insufficient documentation

## 2015-03-29 DIAGNOSIS — I10 Essential (primary) hypertension: Secondary | ICD-10-CM | POA: Insufficient documentation

## 2015-03-29 DIAGNOSIS — R0602 Shortness of breath: Secondary | ICD-10-CM | POA: Insufficient documentation

## 2015-03-29 DIAGNOSIS — I2510 Atherosclerotic heart disease of native coronary artery without angina pectoris: Secondary | ICD-10-CM | POA: Insufficient documentation

## 2015-03-29 DIAGNOSIS — M6281 Muscle weakness (generalized): Secondary | ICD-10-CM | POA: Diagnosis not present

## 2015-03-29 DIAGNOSIS — R262 Difficulty in walking, not elsewhere classified: Secondary | ICD-10-CM | POA: Diagnosis not present

## 2015-03-30 DIAGNOSIS — J189 Pneumonia, unspecified organism: Secondary | ICD-10-CM | POA: Diagnosis not present

## 2015-03-30 DIAGNOSIS — D72829 Elevated white blood cell count, unspecified: Secondary | ICD-10-CM | POA: Diagnosis not present

## 2015-03-30 DIAGNOSIS — A09 Infectious gastroenteritis and colitis, unspecified: Secondary | ICD-10-CM | POA: Diagnosis not present

## 2015-03-31 DIAGNOSIS — M6281 Muscle weakness (generalized): Secondary | ICD-10-CM | POA: Diagnosis not present

## 2015-03-31 DIAGNOSIS — R262 Difficulty in walking, not elsewhere classified: Secondary | ICD-10-CM | POA: Diagnosis not present

## 2015-04-01 ENCOUNTER — Ambulatory Visit (INDEPENDENT_AMBULATORY_CARE_PROVIDER_SITE_OTHER): Payer: Medicare Other | Admitting: Interventional Cardiology

## 2015-04-01 ENCOUNTER — Encounter: Payer: Self-pay | Admitting: Interventional Cardiology

## 2015-04-01 VITALS — BP 134/62 | HR 57 | Ht 63.0 in | Wt 140.0 lb

## 2015-04-01 DIAGNOSIS — I5032 Chronic diastolic (congestive) heart failure: Secondary | ICD-10-CM

## 2015-04-01 DIAGNOSIS — I6523 Occlusion and stenosis of bilateral carotid arteries: Secondary | ICD-10-CM

## 2015-04-01 DIAGNOSIS — R001 Bradycardia, unspecified: Secondary | ICD-10-CM | POA: Diagnosis not present

## 2015-04-01 DIAGNOSIS — I10 Essential (primary) hypertension: Secondary | ICD-10-CM | POA: Diagnosis not present

## 2015-04-01 DIAGNOSIS — R262 Difficulty in walking, not elsewhere classified: Secondary | ICD-10-CM | POA: Diagnosis not present

## 2015-04-01 DIAGNOSIS — M6281 Muscle weakness (generalized): Secondary | ICD-10-CM | POA: Diagnosis not present

## 2015-04-01 DIAGNOSIS — R0989 Other specified symptoms and signs involving the circulatory and respiratory systems: Secondary | ICD-10-CM

## 2015-04-01 DIAGNOSIS — I2510 Atherosclerotic heart disease of native coronary artery without angina pectoris: Secondary | ICD-10-CM

## 2015-04-01 DIAGNOSIS — J189 Pneumonia, unspecified organism: Secondary | ICD-10-CM | POA: Diagnosis not present

## 2015-04-01 MED ORDER — RANOLAZINE ER 500 MG PO TB12: 500.0000 mg | ORAL_TABLET | Freq: Two times a day (BID) | ORAL | Status: DC

## 2015-04-01 NOTE — Progress Notes (Signed)
Cardiology Office Note   Date:  04/01/2015   ID:  Ellen Marshall, DOB Apr 12, 1926, MRN [id number]  PCP:  Henrine Screws, MD  Cardiologist:  Sinclair Grooms, MD   Chief Complaint  Patient presents with  . Coronary Artery Disease      History of Present Illness: Ellen Marshall is a 81 y.o. female who presents for chronic angina pectoris, normal myocardial perfusion study, chronic diastolic heart failure, essential hypertension, history of CVA, prior history of GI bleeding, and recent hospitalization for chest pain with final diagnoses of pneumonia 9//2016.  Overall the patient is doing well. She was recently hospitalized with severe chest discomfort for which she took one nitroglycerin tablet and 3 minutes later another. When the discomfort did not resolve she came to the emergency room. Cardiac evaluation was performed and included an EKG, and serial markers. Both of these metrics were unremarkable. EKG did not reveal any significant change compared to prior. Since discharge is been no recurrence of discomfort. She was discharged on a Z-Pak. She denies cough. No return of the chest discomfort.    Past Medical History  Diagnosis Date  . Coronary artery disease   . Hypertension   . GERD (gastroesophageal reflux disease)   . Shortness of breath     laying down  . Stroke   . Chest pain     Past Surgical History  Procedure Laterality Date  . Colonscopy    . Esophagogastroduodenoscopy    . Stretching of egd    . Abdominal hysterectomy    . Esophagogastroduodenoscopy  10/10/2011    Procedure: ESOPHAGOGASTRODUODENOSCOPY (EGD);  Surgeon: Winfield Cunas., MD;  Location: [facility] ENDOSCOPY;  Service: Endoscopy;  Laterality: N/A;  with control of bleeding  . Esophagogastroduodenoscopy  10/12/2011    Procedure: ESOPHAGOGASTRODUODENOSCOPY (EGD);  Surgeon: Winfield Cunas., MD;  Location: [facility] ENDOSCOPY;  Service: Endoscopy;  Laterality: N/A;  . Flexible sigmoidoscopy  10/12/2011   Procedure: FLEXIBLE SIGMOIDOSCOPY;  Surgeon: Winfield Cunas., MD;  Location: [facility] ENDOSCOPY;  Service: Endoscopy;  Laterality: N/A;     Current Outpatient Prescriptions  Medication Sig Dispense Refill  . acetaminophen (TYLENOL) 650 MG CR tablet Take 1,300 mg by mouth 3 (three) times daily as needed for pain.    Marland Kitchen ALPRAZolam (XANAX) 0.25 MG tablet Take 0.25 mg by mouth 2 (two) times daily. As needed for anxiety. Takes at 2pm and bedtime    . aspirin EC 81 MG tablet Take 1 tablet (81 mg total) by mouth daily.    . Calcium Carbonate-Vitamin D (CALCIUM + D) 600-200 MG-UNIT TABS Take 1 tablet by mouth daily.     . clopidogrel (PLAVIX) 75 MG tablet Take 75 mg by mouth daily after lunch.     . diltiazem (DILACOR XR) 120 MG 24 hr capsule Take 120 mg by mouth daily.    . diphenoxylate-atropine (LOMOTIL) 2.5-0.025 MG per tablet Take 2 tablets by mouth 4 (four) times daily as needed for diarrhea or loose stools.     Marland Kitchen escitalopram (LEXAPRO) 20 MG tablet Take 20 mg by mouth every morning.    . ferrous sulfate 325 (65 FE) MG tablet Take 325 mg by mouth 2 (two) times daily with a meal.    . fexofenadine (ALLEGRA) 180 MG tablet Take 180 mg by mouth daily.    . folic acid (FOLVITE) 101 MCG tablet Take 800 mcg by mouth daily.    . isosorbide mononitrate (IMDUR) 120 MG 24 hr tablet Take 120  mg by mouth daily.    . Lutein 20 MG TABS Take 1 tablet by mouth every morning.    . metoprolol (LOPRESSOR) 50 MG tablet Take 25 mg by mouth 2 (two) times daily.    . Multiple Vitamins-Minerals (CENTRUM SILVER PO) Take 1 tablet by mouth daily at 12 noon.    . nitroGLYCERIN (NITROSTAT) 0.4 MG SL tablet Place 0.4 mg under the tongue every 5 (five) minutes as needed. As needed for chest pain.    . pantoprazole (PROTONIX) 40 MG tablet Take 40 mg by mouth daily.    . ranolazine (RANEXA) 500 MG 12 hr tablet Take 500 mg by mouth 2 (two) times daily.     Marland Kitchen saccharomyces boulardii (FLORASTOR) 250 MG capsule Take 250 mg by mouth  daily.     . timolol (TIMOPTIC-XR) 0.5 % ophthalmic gel-forming Place 1 drop into both eyes daily.    . travoprost, benzalkonium, (TRAVATAN) 0.004 % ophthalmic solution Place 1 drop into both eyes at bedtime.    . traZODone (DESYREL) 50 MG tablet Take 150 mg by mouth at bedtime.     No current facility-administered medications for this visit.    Allergies:   Adhesive; Amoxicillin; Pregabalin; Simvastatin; and Sulfa antibiotics    Social History:  The patient  reports that she has never smoked. She has never used smokeless tobacco. She reports that she does not drink alcohol or use illicit drugs.   Family History:  The patient's family history includes CAD in her brother, father, mother, and sister; Heart attack in her brother, father, mother, and sister; Hypertension in her sister; Pancreatic cancer in her mother.    ROS:  Please see the history of present illness.   Otherwise, review of systems are positive for dyspnea, shoulder discomfort, difficulty with balance, difficulty with ambulation, depression, abdominal pain, and dizziness..   All other systems are reviewed and negative.    PHYSICAL EXAM: VS:  BP 134/62 mmHg  Pulse 57  Ht 5\' 3"  (1.6 m)  Wt 63.504 kg (140 lb)  BMI 24.81 kg/m2  SpO2 98% , BMI Body mass index is 24.81 kg/(m^2). GEN: Well nourished, well developed, in no acute distress. Elderly and frail HEENT: normal Neck: no JVD, carotid bruits, or masses Cardiac: RRR.  There is no murmur, rub, or gallop. There is no edema. Respiratory:  clear to auscultation bilaterally, normal work of breathing. GI: soft, nontender, nondistended, + BS MS: no deformity or atrophy Skin: warm and dry, no rash Neuro:  Strength and sensation are intact Psych: euthymic mood, full affect   EKG:  EKG not repeated. I reviewed recent hospital EKGs and there was no evidence of ischemia.    Recent Labs: 07/22/2014: Magnesium 1.9; TSH 1.558 03/19/2015: ALT 43; B Natriuretic Peptide  232.0* 03/21/2015: BUN 10; Creatinine, Ser 0.95; Hemoglobin 12.2; Platelets 415*; Potassium 3.8; Sodium 133*    Lipid Panel No results found for: CHOL, TRIG, HDL, CHOLHDL, VLDL, LDLCALC, LDLDIRECT    Wt Readings from Last 3 Encounters:  04/01/15 63.504 kg (140 lb)  03/21/15 61.916 kg (136 lb 8 oz)  07/22/14 64.411 kg (142 lb)      Other studies Reviewed: Additional studies/ records that were reviewed today include: Review the recent hospital stay where pneumonia was diagnosed. Review the CT scans. Three-vessel coronary calcification is noted. She did have a ovoid mass in the lung. She will have a repeat CT performed in 3 months. She has significantly improved on anabiotic therapy.. .    ASSESSMENT  AND PLAN:  1. Coronary artery disease involving native coronary artery of native heart without angina pectoris Stable angina. I reinstructed the patient on nitroglycerin use  2. Bradycardia Asymptomatic  3. Essential hypertension Controlled  4. Bilateral carotid bruits Faint left carotid bruit is heard. No symptoms.  5. Diastolic heart failure Without evidence of volume overload  6. Recent pneumonia without clinical symptoms currently.  Current medicines are reviewed at length with the patient today.  The patient has the following concerns regarding medicines: None.  The following changes/actions have been instituted:    Reinstructed on nitroglycerin use  Clinical follow-up in hand at 12 months  Call if increase in frequency of chest discomfort  No change in the current medical regimen  Labs/ tests ordered today include:  No orders of the defined types were placed in this encounter.     Disposition:   FU with HS in 1 year  Signed, Sinclair Grooms, MD  04/01/2015 10:41 AM    [address], [place], [place]  [address] Phone: [phone]; Fax: [phone]

## 2015-04-01 NOTE — Patient Instructions (Signed)
Medication Instructions:  Your physician recommends that you continue on your current medications as directed. Please refer to the Current Medication list given to you today.  Ok to use up to 3 Nitro in 5  Minute increments.  Labwork: None ordered  Testing/Procedures: None ordered  Follow-Up: Your physician wants you to follow-up in: 9-12 months with Dr.Smith You will receive a reminder letter in the mail two months in advance. If you don't receive a letter, please call our office to schedule the follow-up appointment.   Any Other Special Instructions Will Be Listed Below (If Applicable). Have your CT that was ordered your primary care physician repeated in January 2017   Nitroglycerin sublingual tablets What is this medicine? NITROGLYCERIN (nye troe GLI ser in) is a type of vasodilator. It relaxes blood vessels, increasing the blood and oxygen supply to your heart. This medicine is used to relieve chest pain caused by angina. It is also used to prevent chest pain before activities like climbing stairs, going outdoors in cold weather, or sexual activity. This medicine may be used for other purposes; ask your health care provider or pharmacist if you have questions. COMMON BRAND NAME(S): Nitroquick, Nitrostat, Nitrotab What should I tell my health care provider before I take this medicine? They need to know if you have any of these conditions: -anemia -head injury, recent stroke, or bleeding in the brain -liver disease -previous heart attack -an unusual or allergic reaction to nitroglycerin, other medicines, foods, dyes, or preservatives -pregnant or trying to get pregnant -breast-feeding How should I use this medicine? Take this medicine by mouth as needed. At the first sign of an angina attack (chest pain or tightness) place one tablet under your tongue. You can also take this medicine 5 to 10 minutes before an event likely to produce chest pain. Follow the directions on the  prescription label. Let the tablet dissolve under the tongue. Do not swallow whole. Replace the dose if you accidentally swallow it. It will help if your mouth is not dry. Saliva around the tablet will help it to dissolve more quickly. Do not eat or drink, smoke or chew tobacco while a tablet is dissolving. If you are not better within 5 minutes after taking ONE dose of nitroglycerin, call 9-1-1 immediately to seek emergency medical care. Do not take more than 3 nitroglycerin tablets over 15 minutes. If you take this medicine often to relieve symptoms of angina, your doctor or health care professional may provide you with different instructions to manage your symptoms. If symptoms do not go away after following these instructions, it is important to call 9-1-1 immediately. Do not take more than 3 nitroglycerin tablets over 15 minutes. Talk to your pediatrician regarding the use of this medicine in children. Special care may be needed. Overdosage: If you think you have taken too much of this medicine contact a poison control center or emergency room at once. NOTE: This medicine is only for you. Do not share this medicine with others. What if I miss a dose? This does not apply. This medicine is only used as needed. What may interact with this medicine? Do not take this medicine with any of the following medications: -certain migraine medicines like ergotamine and dihydroergotamine (DHE) -medicines used to treat erectile dysfunction like sildenafil, tadalafil, and vardenafil -riociguat This medicine may also interact with the following medications: -alteplase -aspirin -heparin -medicines for high blood pressure -medicines for mental depression -other medicines used to treat angina -phenothiazines like chlorpromazine, mesoridazine, prochlorperazine,  thioridazine This list may not describe all possible interactions. Give your health care provider a list of all the medicines, herbs, non-prescription  drugs, or dietary supplements you use. Also tell them if you smoke, drink alcohol, or use illegal drugs. Some items may interact with your medicine. What should I watch for while using this medicine? Tell your doctor or health care professional if you feel your medicine is no longer working. Keep this medicine with you at all times. Sit or lie down when you take your medicine to prevent falling if you feel dizzy or faint after using it. Try to remain calm. This will help you to feel better faster. If you feel dizzy, take several deep breaths and lie down with your feet propped up, or bend forward with your head resting between your knees. You may get drowsy or dizzy. Do not drive, use machinery, or do anything that needs mental alertness until you know how this drug affects you. Do not stand or sit up quickly, especially if you are an older patient. This reduces the risk of dizzy or fainting spells. Alcohol can make you more drowsy and dizzy. Avoid alcoholic drinks. Do not treat yourself for coughs, colds, or pain while you are taking this medicine without asking your doctor or health care professional for advice. Some ingredients may increase your blood pressure. What side effects may I notice from receiving this medicine? Side effects that you should report to your doctor or health care professional as soon as possible: -blurred vision -dry mouth -skin rash -sweating -the feeling of extreme pressure in the head -unusually weak or tired Side effects that usually do not require medical attention (report to your doctor or health care professional if they continue or are bothersome): -flushing of the face or neck -headache -irregular heartbeat, palpitations -nausea, vomiting This list may not describe all possible side effects. Call your doctor for medical advice about side effects. You may report side effects to FDA at 1-800-FDA-1088. Where should I keep my medicine? Keep out of the reach of  children. Store at room temperature between 20 and 25 degrees C (68 and 77 degrees F). Store in [employer] of [employer]. Protect from light and moisture. Keep tightly closed. Throw away any unused medicine after the expiration date. NOTE: This sheet is a summary. It may not cover all possible information. If you have questions about this medicine, talk to your doctor, pharmacist, or health care provider.  2015, [employer]vier/[employer] Standard. (2013-05-01 17:57:36)

## 2015-04-06 DIAGNOSIS — R262 Difficulty in walking, not elsewhere classified: Secondary | ICD-10-CM | POA: Diagnosis not present

## 2015-04-06 DIAGNOSIS — M6281 Muscle weakness (generalized): Secondary | ICD-10-CM | POA: Diagnosis not present

## 2015-04-07 DIAGNOSIS — M6281 Muscle weakness (generalized): Secondary | ICD-10-CM | POA: Diagnosis not present

## 2015-04-07 DIAGNOSIS — R262 Difficulty in walking, not elsewhere classified: Secondary | ICD-10-CM | POA: Diagnosis not present

## 2015-04-08 DIAGNOSIS — R262 Difficulty in walking, not elsewhere classified: Secondary | ICD-10-CM | POA: Diagnosis not present

## 2015-04-08 DIAGNOSIS — M6281 Muscle weakness (generalized): Secondary | ICD-10-CM | POA: Diagnosis not present

## 2015-04-12 DIAGNOSIS — R262 Difficulty in walking, not elsewhere classified: Secondary | ICD-10-CM | POA: Diagnosis not present

## 2015-04-12 DIAGNOSIS — M6281 Muscle weakness (generalized): Secondary | ICD-10-CM | POA: Diagnosis not present

## 2015-04-13 DIAGNOSIS — M6281 Muscle weakness (generalized): Secondary | ICD-10-CM | POA: Diagnosis not present

## 2015-04-13 DIAGNOSIS — R262 Difficulty in walking, not elsewhere classified: Secondary | ICD-10-CM | POA: Diagnosis not present

## 2015-04-15 DIAGNOSIS — M6281 Muscle weakness (generalized): Secondary | ICD-10-CM | POA: Diagnosis not present

## 2015-04-15 DIAGNOSIS — R262 Difficulty in walking, not elsewhere classified: Secondary | ICD-10-CM | POA: Diagnosis not present

## 2015-04-20 DIAGNOSIS — R262 Difficulty in walking, not elsewhere classified: Secondary | ICD-10-CM | POA: Diagnosis not present

## 2015-04-20 DIAGNOSIS — M6281 Muscle weakness (generalized): Secondary | ICD-10-CM | POA: Diagnosis not present

## 2015-04-21 DIAGNOSIS — M6281 Muscle weakness (generalized): Secondary | ICD-10-CM | POA: Diagnosis not present

## 2015-04-21 DIAGNOSIS — R262 Difficulty in walking, not elsewhere classified: Secondary | ICD-10-CM | POA: Diagnosis not present

## 2015-04-22 DIAGNOSIS — M6281 Muscle weakness (generalized): Secondary | ICD-10-CM | POA: Diagnosis not present

## 2015-04-22 DIAGNOSIS — R262 Difficulty in walking, not elsewhere classified: Secondary | ICD-10-CM | POA: Diagnosis not present

## 2015-04-27 DIAGNOSIS — R262 Difficulty in walking, not elsewhere classified: Secondary | ICD-10-CM | POA: Diagnosis not present

## 2015-04-27 DIAGNOSIS — M6281 Muscle weakness (generalized): Secondary | ICD-10-CM | POA: Diagnosis not present

## 2015-04-28 DIAGNOSIS — M6281 Muscle weakness (generalized): Secondary | ICD-10-CM | POA: Diagnosis not present

## 2015-04-28 DIAGNOSIS — R262 Difficulty in walking, not elsewhere classified: Secondary | ICD-10-CM | POA: Diagnosis not present

## 2015-04-29 DIAGNOSIS — M6281 Muscle weakness (generalized): Secondary | ICD-10-CM | POA: Diagnosis not present

## 2015-04-29 DIAGNOSIS — R262 Difficulty in walking, not elsewhere classified: Secondary | ICD-10-CM | POA: Diagnosis not present

## 2015-05-22 ENCOUNTER — Other Ambulatory Visit: Payer: Self-pay | Admitting: Physician Assistant

## 2015-05-31 DIAGNOSIS — H401134 Primary open-angle glaucoma, bilateral, indeterminate stage: Secondary | ICD-10-CM | POA: Diagnosis not present

## 2015-05-31 DIAGNOSIS — H353134 Nonexudative age-related macular degeneration, bilateral, advanced atrophic with subfoveal involvement: Secondary | ICD-10-CM | POA: Diagnosis not present

## 2015-07-06 IMAGING — CT CT HEAD W/O CM
1 of 2 series · 15 of 30 positions shown, 19 images · non-contrast
Comparison: None.

CLINICAL DATA: Sudden extremity weakness. Unable to sit or stand
earlier today. Remote history of stroke. Severe right-sided headache
over the last 24 hr.

EXAM:
CT HEAD WITHOUT CONTRAST
TECHNIQUE: Contiguous axial images were obtained from the base of the skull
through the vertex without intravenous contrast.

[Series 3: head 2.0 h70h · axial · 0.44mm/px · z∈[-104,+30]mm · 15 of 75 slices shown, 19 images]
[im 4/75  brain]
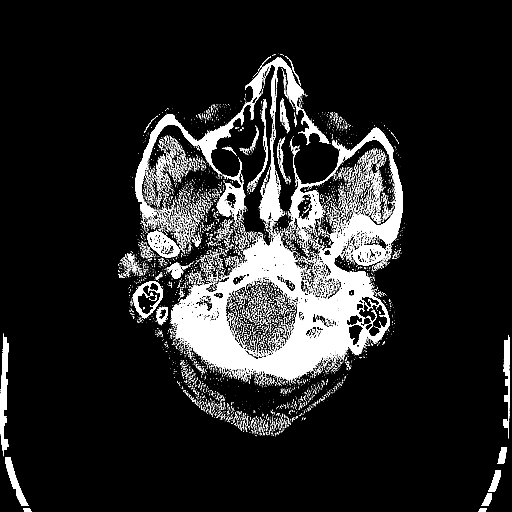
[im 4/75  bone]
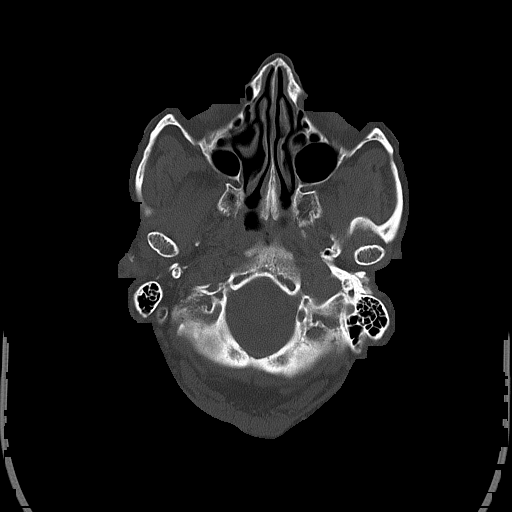
[im 8/75  brain]
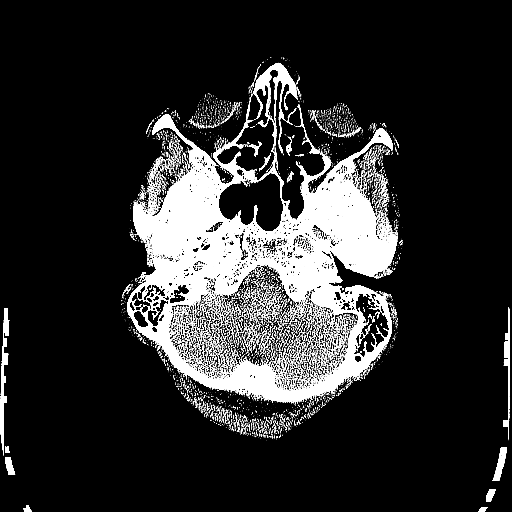
[im 15/75  brain]
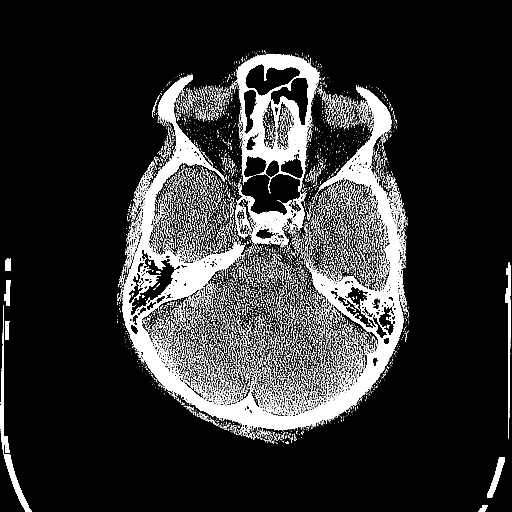
[im 19/75  brain]
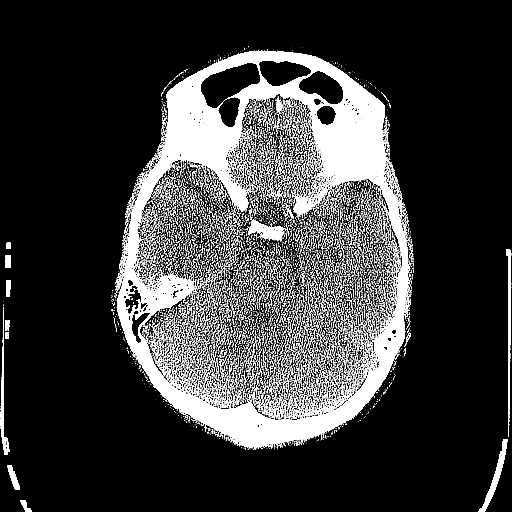
[im 23/75  brain]
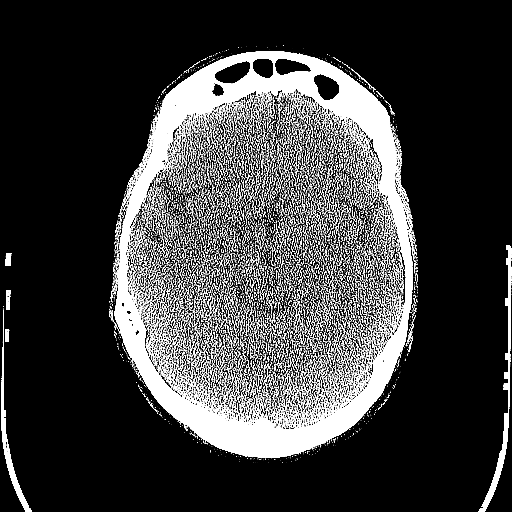
[im 23/75  bone]
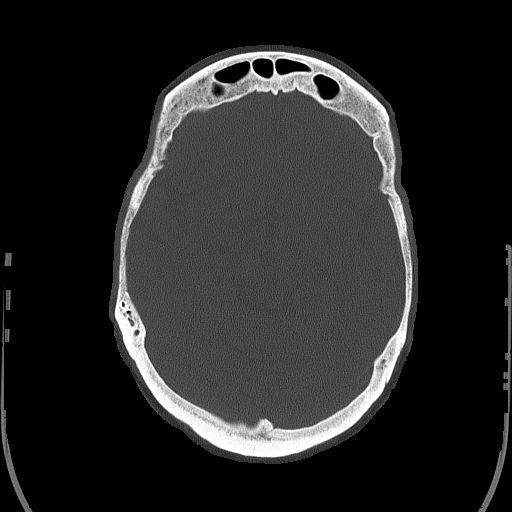
[im 26/75  brain]
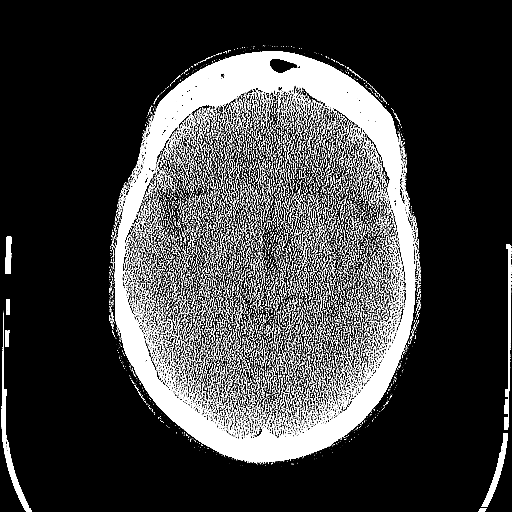
[im 34/75  brain]
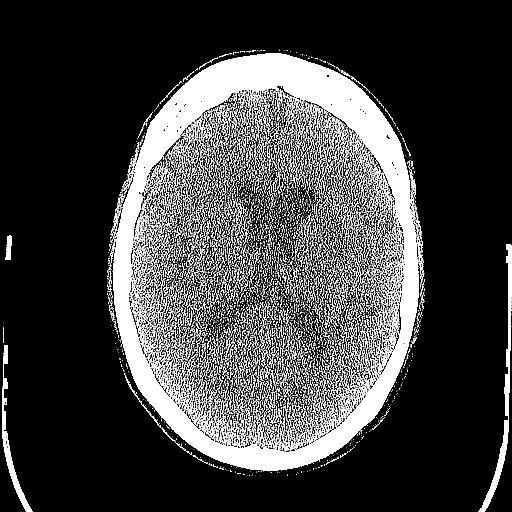
[im 38/75  brain]
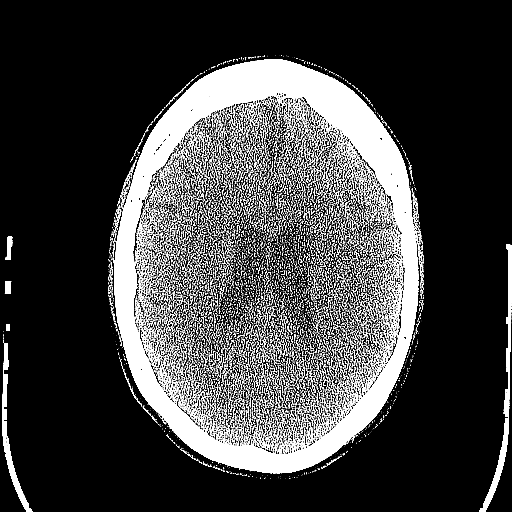
[im 41/75  brain]
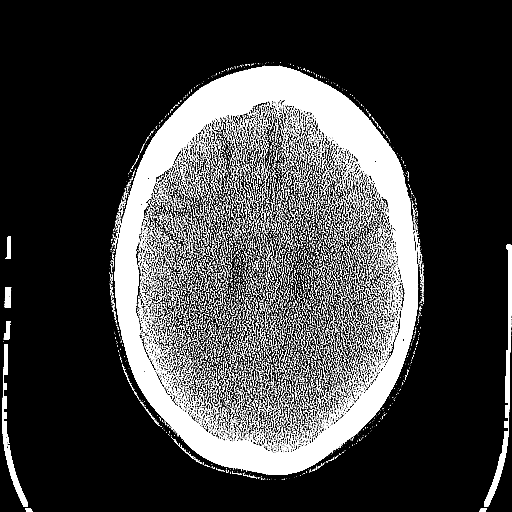
[im 41/75  bone]
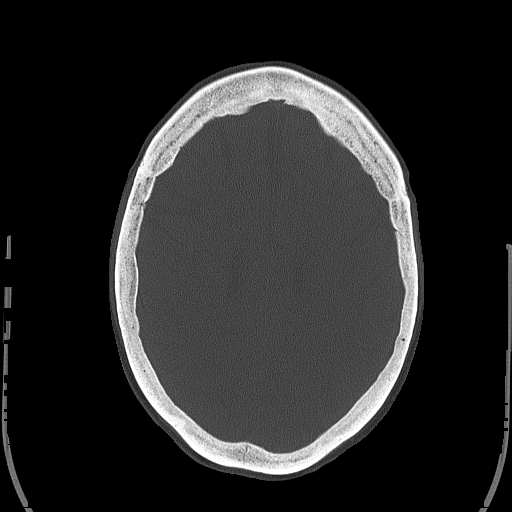
[im 49/75  brain]
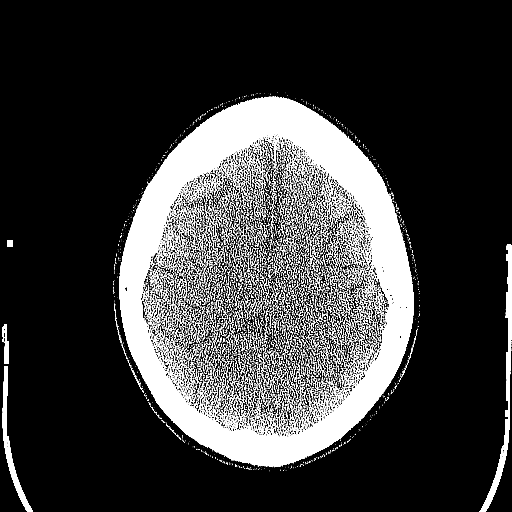
[im 52/75  brain]
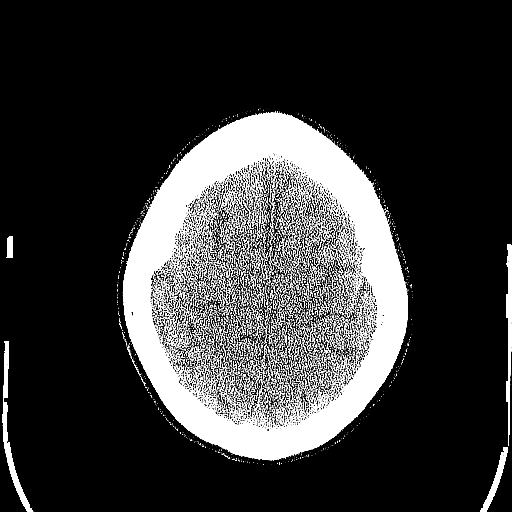
[im 56/75  brain]
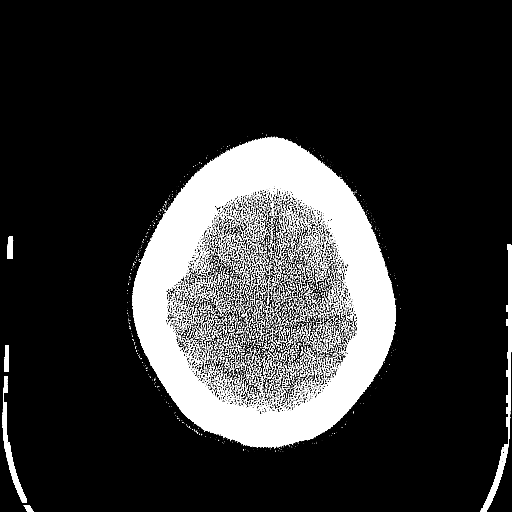
[im 60/75  brain]
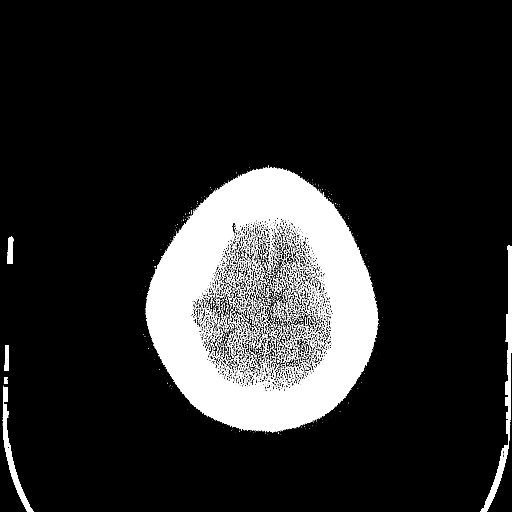
[im 60/75  bone]
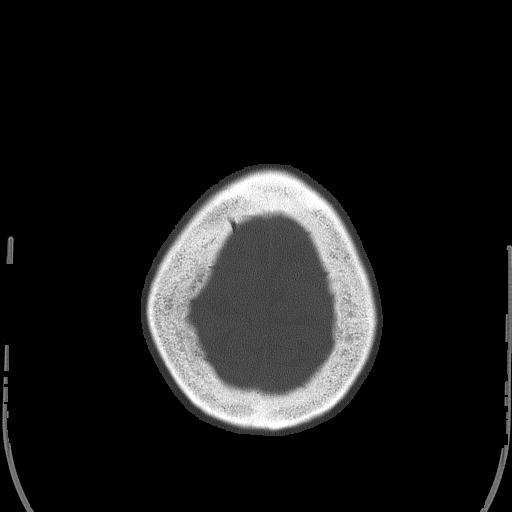
[im 67/75  brain]
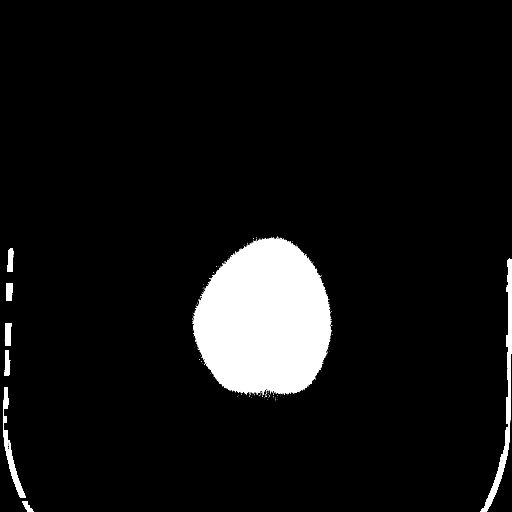
[im 71/75  brain]
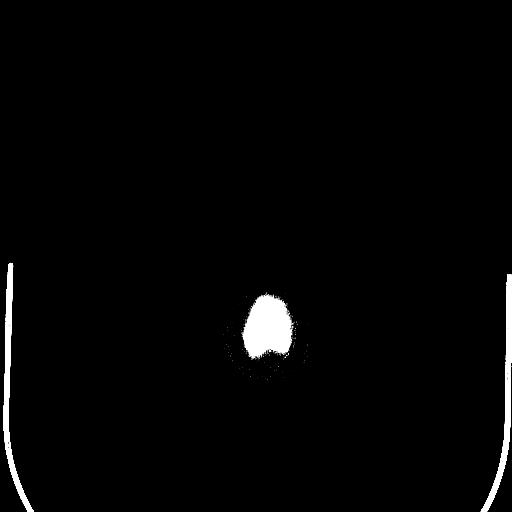

[15 of 30 positions shown; findings below may reference images not displayed]

FINDINGS: Remote medial cerebellar infarcts scratch the remote medial flank
infarcts are stable. There is also a remote infarct of the
peripheral right cerebellum. No acute cortical infarct, hemorrhage,
or mass lesion is present. The ventricles are of normal size. No
significant extraaxial fluid collection is present.

The paranasal sinuses and mastoid air cells are clear.
Atherosclerotic calcifications are evident. The calvarium is intact.
IMPRESSION: 1. Stable remote infarcts of the thalami and right cerebellum.
2. No acute intracranial abnormality or significant interval change.
3. Atherosclerosis.

## 2015-08-26 ENCOUNTER — Other Ambulatory Visit: Payer: Self-pay | Admitting: Internal Medicine

## 2015-08-26 ENCOUNTER — Ambulatory Visit
Admission: RE | Admit: 2015-08-26 | Discharge: 2015-08-26 | Disposition: A | Discharge status: Home / Self Care | Payer: Medicare Other | Source: Ambulatory Visit | Attending: Internal Medicine | Admitting: Internal Medicine

## 2015-08-26 DIAGNOSIS — R109 Unspecified abdominal pain: Secondary | ICD-10-CM

## 2015-08-26 DIAGNOSIS — E875 Hyperkalemia: Secondary | ICD-10-CM | POA: Diagnosis not present

## 2015-08-30 DIAGNOSIS — E875 Hyperkalemia: Secondary | ICD-10-CM | POA: Diagnosis not present

## 2015-09-06 DIAGNOSIS — R109 Unspecified abdominal pain: Secondary | ICD-10-CM | POA: Diagnosis not present

## 2015-12-22 ENCOUNTER — Other Ambulatory Visit: Payer: Self-pay | Admitting: Internal Medicine

## 2015-12-22 ENCOUNTER — Ambulatory Visit
Admission: RE | Admit: 2015-12-22 | Discharge: 2015-12-22 | Disposition: A | Discharge status: Home / Self Care | Payer: Medicare Other | Source: Ambulatory Visit | Attending: Internal Medicine | Admitting: Internal Medicine

## 2015-12-22 DIAGNOSIS — R0781 Pleurodynia: Secondary | ICD-10-CM

## 2015-12-22 DIAGNOSIS — M25511 Pain in right shoulder: Secondary | ICD-10-CM | POA: Diagnosis not present

## 2016-01-05 DIAGNOSIS — R0789 Other chest pain: Secondary | ICD-10-CM | POA: Diagnosis not present

## 2016-01-05 DIAGNOSIS — I10 Essential (primary) hypertension: Secondary | ICD-10-CM | POA: Diagnosis not present

## 2016-01-10 ENCOUNTER — Encounter: Payer: Self-pay | Admitting: Interventional Cardiology

## 2016-02-08 ENCOUNTER — Encounter: Payer: Self-pay | Admitting: Interventional Cardiology

## 2016-02-12 DIAGNOSIS — R413 Other amnesia: Secondary | ICD-10-CM | POA: Diagnosis not present

## 2016-02-12 DIAGNOSIS — I209 Angina pectoris, unspecified: Secondary | ICD-10-CM | POA: Diagnosis not present

## 2016-02-12 DIAGNOSIS — I69351 Hemiplegia and hemiparesis following cerebral infarction affecting right dominant side: Secondary | ICD-10-CM | POA: Diagnosis not present

## 2016-02-12 DIAGNOSIS — R1310 Dysphagia, unspecified: Secondary | ICD-10-CM | POA: Diagnosis not present

## 2016-02-12 DIAGNOSIS — G629 Polyneuropathy, unspecified: Secondary | ICD-10-CM | POA: Diagnosis not present

## 2016-02-12 DIAGNOSIS — Z7902 Long term (current) use of antithrombotics/antiplatelets: Secondary | ICD-10-CM | POA: Diagnosis not present

## 2016-02-12 DIAGNOSIS — Z9181 History of falling: Secondary | ICD-10-CM | POA: Diagnosis not present

## 2016-02-12 DIAGNOSIS — Z7982 Long term (current) use of aspirin: Secondary | ICD-10-CM | POA: Diagnosis not present

## 2016-02-12 DIAGNOSIS — I10 Essential (primary) hypertension: Secondary | ICD-10-CM | POA: Diagnosis not present

## 2016-02-14 ENCOUNTER — Telehealth: Payer: Self-pay | Admitting: Interventional Cardiology

## 2016-02-14 DIAGNOSIS — R1310 Dysphagia, unspecified: Secondary | ICD-10-CM | POA: Diagnosis not present

## 2016-02-14 DIAGNOSIS — I69351 Hemiplegia and hemiparesis following cerebral infarction affecting right dominant side: Secondary | ICD-10-CM | POA: Diagnosis not present

## 2016-02-14 DIAGNOSIS — I10 Essential (primary) hypertension: Secondary | ICD-10-CM | POA: Diagnosis not present

## 2016-02-14 DIAGNOSIS — R413 Other amnesia: Secondary | ICD-10-CM | POA: Diagnosis not present

## 2016-02-14 DIAGNOSIS — I209 Angina pectoris, unspecified: Secondary | ICD-10-CM | POA: Diagnosis not present

## 2016-02-14 DIAGNOSIS — G629 Polyneuropathy, unspecified: Secondary | ICD-10-CM | POA: Diagnosis not present

## 2016-02-14 NOTE — Telephone Encounter (Signed)
Pt was enrolled in home health by her pcp due to her uncontrollable BP. Spoke with patients homehealth nurse Angela,RN. patient now lives with her son.She was out to the patients home today and her son reports that the patients BP yesterday was 201/86,  the the patient complained of chest and jaw pain she was given Nitro x2 and was not taken to the ED, pt BP went down to 190/80 after Levell July pt son that if any reoccurrence of BP being that high and chest pain they are to call 911. Levada Dy sts that the pt's BP today was 156/78, pt has no chest pain. Pt has an appt on 8/14 to see Dr.Smith, but would like an earlier appt. Cloretta Ned that we could try to get the pt scheduled to see an APP, I will need to talk with a scheduler, I can call the patients son directly with appt info.

## 2016-02-14 NOTE — Telephone Encounter (Signed)
Pt c/o BP issue: STAT if pt c/o blurred vision, one-sided weakness or slurred speech  1. What are your last 5 BP readings? 195/75    151/52   212/75    2. Are you having any other symptoms (ex. Dizziness, headache, blurred vision, passed out)? headache  3. What is your BP issue? BP high, took two nitroglycerine

## 2016-02-14 NOTE — Telephone Encounter (Signed)
Routed to Dr.Smith to advise as well

## 2016-02-15 DIAGNOSIS — I10 Essential (primary) hypertension: Secondary | ICD-10-CM | POA: Diagnosis not present

## 2016-02-15 NOTE — Telephone Encounter (Signed)
Spoke with Dr Tamala Julian in regards to patient's increased BP.  Would like for PCP to handle this.  I have left a voicemail with [facility] regarding this.  Patient is due to follow up with Dr Tamala Julian in Sept. If she has more CP can call and will see her with APP.

## 2016-02-15 NOTE — Telephone Encounter (Signed)
Follow Up:    Son said he was waiting to hear from the nurse from yesterday/ Last nigh pt's blood pressure was 211/72 and this morning it was 215/85 around 9:00 A.M.. He gave her a nitro and it went down to 165/73. Pt needs to be seen,nurse had said she was going to work out something for today or tomorrow. Please call asap to advise.

## 2016-02-15 NOTE — Telephone Encounter (Signed)
Patient's son called back.  I spoke with him relaying recommendations from Dr Tamala Julian in regards to her BP and following up with her PCP regarding those issues.  He was very unhappy with this and stated, "THANKS A LOT" and hung up on me.  He says he was told by a nurse yesterday to keep her follow up appointment with Dr Tamala Julian in 2 weeks and be seen today or tomorrow by a nurse to address the BP issue at hand.  I tried to explain to him that the [facility] was ordered by her PCP for the increased BP but he did not want to hear anything I was saying.   I will forward to Dr Tamala Julian and his nurse for [facility].

## 2016-02-17 DIAGNOSIS — R1310 Dysphagia, unspecified: Secondary | ICD-10-CM | POA: Diagnosis not present

## 2016-02-17 DIAGNOSIS — G629 Polyneuropathy, unspecified: Secondary | ICD-10-CM | POA: Diagnosis not present

## 2016-02-17 DIAGNOSIS — I10 Essential (primary) hypertension: Secondary | ICD-10-CM | POA: Diagnosis not present

## 2016-02-17 DIAGNOSIS — R413 Other amnesia: Secondary | ICD-10-CM | POA: Diagnosis not present

## 2016-02-17 DIAGNOSIS — I209 Angina pectoris, unspecified: Secondary | ICD-10-CM | POA: Diagnosis not present

## 2016-02-17 DIAGNOSIS — I69351 Hemiplegia and hemiparesis following cerebral infarction affecting right dominant side: Secondary | ICD-10-CM | POA: Diagnosis not present

## 2016-02-17 NOTE — Telephone Encounter (Signed)
They have called multiple times. Please see if she can be placed on an extender's schedule tomorrow.

## 2016-02-17 NOTE — Telephone Encounter (Signed)
I would see her myself tomorrow but am stuck in the hospital all day.

## 2016-02-17 NOTE — Telephone Encounter (Signed)
Noted  

## 2016-02-17 NOTE — Telephone Encounter (Signed)
Follow up  Pt c/o BP issue:  1. What are your last 5 BP readings? Nothing below 160, per pts nurse Renee 2. Are you having any other symptoms (ex. Dizziness, headache, blurred vision, passed out)? Headache, Dizziness, Jaw Pain 3. What is your medication issue? Nurse states Nitro is increasing pts bp.  Pts nurse, Joseph Art is also wanting a sooner appt than 8.14, advised nurse nothing sooner than 8.14 even with an APP.  Please follow up

## 2016-02-17 NOTE — Telephone Encounter (Signed)
[facility] wanted to make sure Dr. Tamala Julian is aware patient is taking nitro everyday trying to get BP down.  She is waiting to hear back from pt's PCP, who is not in the office today.  She has reviewed instructions for nitro w/ son and patient. I have advised her that office does not have a sooner appt to office the patient. She wanted Tamala Julian aware pt taking nitro daily. Will forward to Dr. Tamala Julian

## 2016-02-17 NOTE — Telephone Encounter (Signed)
Appt made for tomorrow with extender. Son is grateful for our help with this.

## 2016-02-18 ENCOUNTER — Ambulatory Visit (INDEPENDENT_AMBULATORY_CARE_PROVIDER_SITE_OTHER): Payer: Medicare Other | Admitting: Nurse Practitioner

## 2016-02-18 ENCOUNTER — Encounter: Payer: Self-pay | Admitting: Nurse Practitioner

## 2016-02-18 VITALS — BP 120/58 | HR 55 | Ht 63.0 in | Wt 138.8 lb

## 2016-02-18 DIAGNOSIS — I2510 Atherosclerotic heart disease of native coronary artery without angina pectoris: Secondary | ICD-10-CM | POA: Diagnosis not present

## 2016-02-18 DIAGNOSIS — I10 Essential (primary) hypertension: Secondary | ICD-10-CM | POA: Diagnosis not present

## 2016-02-18 DIAGNOSIS — I5032 Chronic diastolic (congestive) heart failure: Secondary | ICD-10-CM

## 2016-02-18 LAB — BASIC METABOLIC PANEL
BUN: 19 mg/dL (ref 7–25)
CO2: 27 mmol/L (ref 20–31)
Calcium: 8.9 mg/dL (ref 8.6–10.4)
Chloride: 94 mmol/L — ABNORMAL LOW (ref 98–110)
Creat: 1.01 mg/dL — ABNORMAL HIGH (ref 0.60–0.88)
Glucose, Bld: 97 mg/dL (ref 65–99)
Potassium: 4.6 mmol/L (ref 3.5–5.3)
Sodium: 128 mmol/L — ABNORMAL LOW (ref 135–146)

## 2016-02-18 MED ORDER — AMLODIPINE BESYLATE 5 MG PO TABS: 5.0000 mg | ORAL_TABLET | Freq: Every day | ORAL | 6 refills | Status: DC

## 2016-02-18 NOTE — Patient Instructions (Addendum)
We will be checking the following labs today - BMET   Medication Instructions:    Continue with your current medicines. BUT  I am stopping the Diltiazem  I am adding Norvasc 5 mg to take at bedtime   Testing/Procedures To Be Arranged:  N/A  Follow-Up:   See Dr. Tamala Julian as planned later this month.     Other Special Instructions:   Start a new BP diary.     If you need a refill on your cardiac medications before your next appointment, please call your pharmacy.   Call the [place] office at [phone] if you have any questions, problems or concerns.

## 2016-02-18 NOTE — Progress Notes (Signed)
CARDIOLOGY OFFICE NOTE  Date:  02/18/2016    Ellen Marshall Date of Birth: September 09, 1925 Medical Record [id number]  PCP:  Ellen Screws, MD  Cardiologist:  Ellen Marshall    Chief Complaint  Patient presents with  . Hypertension  . Chest Pain    Work in visit - seen for Dr. Tamala Marshall    History of Present Illness: Ellen Marshall is a 82 y.o. female who presents today for a work in visit. Seen for Dr. Tamala Marshall.   She has a history of CAD, HTN, GERD and prior stroke. She has chronic angina, prior normal perfusion study back in 2015 and chronic diastolic HF and prior GI bleed.   Admitted last September with pneumonia.   Last seen here back in September.   Phone call earlier this week - Pt was enrolled in home health by her pcp due to her uncontrollable BP. Spoke with patients homehealth nurse Angela,RN. patient now lives with her son.She was out to the patients home today and her son reports that the patients BP yesterday was 201/86,  the the patient complained of chest and jaw pain she was given Nitro x2 and was not taken to the ED, pt BP went down to 190/80 after Levell July pt son that if any reoccurrence of BP being that high and chest pain they are to call 911. Levada Dy sts that the pt's BP today was 156/78, pt has no chest pain. Pt has an appt on 8/14 to see Dr.Smith, but would like an earlier appt. Ellen Marshall that we could try to get the pt scheduled to see an APP, I will need to talk with a scheduler, I can call the patients son directly with appt info.  Thus added to my schedule.  Comes in today. Here with her son. She is now at some facility. Recent onset of elevated BP - mostly in the early AM and late PM. Brings in readings over the past week - some up to 211/75. Seen by PCP coverage and was told to use extra Metoprolol prn. She has been using NTG almost daily. Losartan added about 6 weeks ago due to elevated BP. Got a phone call from PCP today - to start Hydralazine - this  has not been done. She does not really use salt. She continues to have spells of chest pain - uses NTG - this is chronic.   Past Medical History:  Diagnosis Date  . Chest pain   . Coronary artery disease   . GERD (gastroesophageal reflux disease)   . Hypertension   . Shortness of breath    laying down  . Stroke [facility])     Past Surgical History:  Procedure Laterality Date  . ABDOMINAL HYSTERECTOMY    . colonscopy    . ESOPHAGOGASTRODUODENOSCOPY    . ESOPHAGOGASTRODUODENOSCOPY  10/10/2011   Procedure: ESOPHAGOGASTRODUODENOSCOPY (EGD);  Surgeon: Winfield Cunas., MD;  Location: [facility] ENDOSCOPY;  Service: Endoscopy;  Laterality: N/A;  with control of bleeding  . ESOPHAGOGASTRODUODENOSCOPY  10/12/2011   Procedure: ESOPHAGOGASTRODUODENOSCOPY (EGD);  Surgeon: Winfield Cunas., MD;  Location: [facility] ENDOSCOPY;  Service: Endoscopy;  Laterality: N/A;  . FLEXIBLE SIGMOIDOSCOPY  10/12/2011   Procedure: FLEXIBLE SIGMOIDOSCOPY;  Surgeon: Winfield Cunas., MD;  [facility] ENDOSCOPY;  Service: Endoscopy;  Laterality: N/A;  . stretching of EGD       Medications: Current Outpatient Prescriptions  Medication Sig Dispense Refill  . acetaminophen (TYLENOL) 650 MG CR tablet Take  1,300 mg by mouth 3 (three) times daily as needed for pain.    Marland Kitchen ALPRAZolam (XANAX) 0.25 MG tablet Take 0.25 mg by mouth 2 (two) times daily. As needed for anxiety. Takes at 2pm and bedtime    . aspirin EC 81 MG tablet Take 1 tablet (81 mg total) by mouth daily.    . Calcium Carbonate-Vitamin D (CALCIUM + D) 600-200 MG-UNIT TABS Take 1 tablet by mouth daily.     . clopidogrel (PLAVIX) 75 MG tablet Take 75 mg by mouth daily after lunch.     . diphenoxylate-atropine (LOMOTIL) 2.5-0.025 MG per tablet Take 2 tablets by mouth 4 (four) times daily as needed for diarrhea or loose stools.     Marland Kitchen escitalopram (LEXAPRO) 20 MG tablet Take 20 mg by mouth every morning.    . ferrous sulfate 325 (65 FE) MG tablet Take 325 mg by mouth 2  (two) times daily with a meal.    . fexofenadine (ALLEGRA) 180 MG tablet Take 180 mg by mouth daily.    . folic acid (FOLVITE) A999333 MCG tablet Take 800 mcg by mouth daily.    . isosorbide mononitrate (IMDUR) 120 MG 24 hr tablet TAKE 1 TABLET DAILY 90 tablet 3  . losartan (COZAAR) 100 MG tablet take one tablet (100 mg ) daily  1  . Lutein 20 MG TABS Take 1 tablet by mouth every morning.    . metoprolol (LOPRESSOR) 50 MG tablet Take 25 mg by mouth 2 (two) times daily.    . Multiple Vitamins-Minerals (CENTRUM SILVER PO) Take 1 tablet by mouth daily at 12 noon.    . nitroGLYCERIN (NITROSTAT) 0.4 MG SL tablet Place 0.4 mg under the tongue every 5 (five) minutes as needed. As needed for chest pain.    . pantoprazole (PROTONIX) 40 MG tablet Take 40 mg by mouth daily.    . ranolazine (RANEXA) 500 MG 12 hr tablet Take 1 tablet (500 mg total) by mouth 2 (two) times daily. 180 tablet 3  . saccharomyces boulardii (FLORASTOR) 250 MG capsule Take 250 mg by mouth daily.     . timolol (TIMOPTIC-XR) 0.5 % ophthalmic gel-forming Place 1 drop into both eyes daily.    . travoprost, benzalkonium, (TRAVATAN) 0.004 % ophthalmic solution Place 1 drop into both eyes at bedtime.    . traZODone (DESYREL) 50 MG tablet Take 150 mg by mouth at bedtime.    Marland Kitchen amLODipine (NORVASC) 5 MG tablet Take 1 tablet (5 mg total) by mouth daily. 30 tablet 6   No current facility-administered medications for this visit.     Allergies: Allergies  Allergen Reactions  . Adhesive [Tape] Other (See Comments)    Burning, Itching.  . Amoxicillin Diarrhea  . Pregabalin     Weakness, diarrhea   . Simvastatin     Myalgias   . Sulfa Antibiotics Itching    Social History: The patient  reports that she has never smoked. She has never used smokeless tobacco. She reports that she does not drink alcohol or use drugs.   Family History: The patient's family history includes CAD in her brother, father, mother, and sister; Heart attack in her  brother, father, mother, and sister; Hypertension in her sister; Pancreatic cancer in her mother.   Review of Systems: Please see the history of present illness.   Otherwise, the review of systems is positive for none.   All other systems are reviewed and negative.   Physical Exam: VS:  BP (!) 120/58  Pulse (!) 55   Ht 5\' 3"  (1.6 m)   Wt 138 lb 12.8 oz (63 kg)   SpO2 98% Comment: at rest  BMI 24.59 kg/m  .  BMI Body mass index is 24.59 kg/m.  Wt Readings from Last 3 Encounters:  02/18/16 138 lb 12.8 oz (63 kg)  04/01/15 140 lb (63.5 kg)  03/21/15 136 lb 8 oz (61.9 kg)    General: Pleasant. Elderly female who is alert and in no acute distress. Seems chronically ill.   HEENT: Normal.  Neck: Supple, no JVD, carotid bruits, or masses noted.  Cardiac: Regular rate and rhythm. Heart tones are distant. No edema.  Respiratory:  Lungs are clear to auscultation bilaterally with normal work of breathing.  GI: Soft and nontender.  MS: No deformity or atrophy. Gait and ROM intact.  Skin: Warm and dry. Color is normal.  Neuro:  Strength and sensation are intact and no gross focal deficits noted.  Psych: Alert, appropriate and with normal affect.   LABORATORY DATA:  EKG:  EKG is ordered today. This shows sinus bradycardia. She has poor R wave progression and inferior Q's noted. Tracing is unchanged.  Lab Results  Component Value Date   WBC 10.4 03/21/2015   HGB 12.2 03/21/2015   HCT 36.6 03/21/2015   PLT 415 (H) 03/21/2015   GLUCOSE 96 03/21/2015   ALT 43 03/19/2015   AST 42 (H) 03/19/2015   NA 133 (L) 03/21/2015   K 3.8 03/21/2015   CL 101 03/21/2015   CREATININE 0.95 03/21/2015   BUN 10 03/21/2015   CO2 25 03/21/2015   TSH 1.558 07/22/2014    BNP (last 3 results)  Recent Labs  03/19/15 2219  BNP 232.0*    ProBNP (last 3 results) No results for input(s): PROBNP in the last 8760 hours.   Other Studies Reviewed Today:  Myoview Impression from 02/2014 Exercise  Capacity:  Lexiscan with no exercise. BP Response:  Normal blood pressure response. Clinical Symptoms:  Dyspnea ECG Impression:  No significant ST segment change suggestive of ischemia. Comparison with Prior Nuclear Study: No images to compare  Overall Impression:  Low risk stress nuclear study with a small, mild fixed apical perfusion defect that may represent soft tissue attenuation.  No ischemia. .  LV Ejection Fraction: 69%.  LV Wall Motion:  NL LV Function; NL Wall Motion  [name] 02/24/2014   Echo Study Conclusions from 07/2013  - Left ventricle: The cavity size was normal. Wall thickness was normal. Systolic function was normal. The estimated ejection fraction was in the range of 60% to 65%. Wall motion was normal; there were no regional wall motion abnormalities. Doppler parameters are consistent with abnormal left ventricular relaxation (grade 1 diastolic dysfunction). - Mitral valve: Calcified annulus. Impressions:  - Compared to the prior study, there has been no significant interval change.  Assessment/Plan: 1. HTN -  Looks to be high just in the early Am and late pm. Will stop the Diltiazem that she is taking in the Am - replace this with Norvasc 5 mg at bedtime. Continue to monitor the BP. HR should come up some. Would not start the Hydralazine at this time. Explained that we really should have just one provider managing this issue going forward.   2. Chronic chest pain - on nitrate and Ranexa therapy - would continue with medical management. I suspect her BP is some of the etiology but she has had this chronically and uses NTG on a chronic basis.  3. Diastolic HF  4. Chronic bradycardia - stopping Diltiazem today. Replacing with Norvasc.  Current medicines are reviewed with the patient today.  The patient does not have concerns regarding medicines other than what has been noted above.  The following changes have been made:  See  above.  Labs/ tests ordered today include:    Orders Placed This Encounter  Procedures  . Basic metabolic panel  . EKG 12-Lead     Disposition:   FU with Dr. Tamala Marshall as planned later this month.    Patient is agreeable to this plan and will call if any problems develop in the interim.   Signed: Burtis Junes, RN, ANP-C 02/18/2016 10:32 AM  [facility] [address] [place], [place]  [address] Phone: [phone] Fax: [phone]

## 2016-02-21 DIAGNOSIS — R1310 Dysphagia, unspecified: Secondary | ICD-10-CM | POA: Diagnosis not present

## 2016-02-21 DIAGNOSIS — G629 Polyneuropathy, unspecified: Secondary | ICD-10-CM | POA: Diagnosis not present

## 2016-02-21 DIAGNOSIS — R413 Other amnesia: Secondary | ICD-10-CM | POA: Diagnosis not present

## 2016-02-21 DIAGNOSIS — I209 Angina pectoris, unspecified: Secondary | ICD-10-CM | POA: Diagnosis not present

## 2016-02-21 DIAGNOSIS — I69351 Hemiplegia and hemiparesis following cerebral infarction affecting right dominant side: Secondary | ICD-10-CM | POA: Diagnosis not present

## 2016-02-21 DIAGNOSIS — I10 Essential (primary) hypertension: Secondary | ICD-10-CM | POA: Diagnosis not present

## 2016-02-23 DIAGNOSIS — G629 Polyneuropathy, unspecified: Secondary | ICD-10-CM | POA: Diagnosis not present

## 2016-02-23 DIAGNOSIS — I10 Essential (primary) hypertension: Secondary | ICD-10-CM | POA: Diagnosis not present

## 2016-02-23 DIAGNOSIS — R413 Other amnesia: Secondary | ICD-10-CM | POA: Diagnosis not present

## 2016-02-23 DIAGNOSIS — R1310 Dysphagia, unspecified: Secondary | ICD-10-CM | POA: Diagnosis not present

## 2016-02-23 DIAGNOSIS — I209 Angina pectoris, unspecified: Secondary | ICD-10-CM | POA: Diagnosis not present

## 2016-02-23 DIAGNOSIS — I69351 Hemiplegia and hemiparesis following cerebral infarction affecting right dominant side: Secondary | ICD-10-CM | POA: Diagnosis not present

## 2016-02-25 ENCOUNTER — Telehealth: Payer: Self-pay | Admitting: Interventional Cardiology

## 2016-02-25 DIAGNOSIS — R413 Other amnesia: Secondary | ICD-10-CM | POA: Diagnosis not present

## 2016-02-25 DIAGNOSIS — I10 Essential (primary) hypertension: Secondary | ICD-10-CM | POA: Diagnosis not present

## 2016-02-25 DIAGNOSIS — I69351 Hemiplegia and hemiparesis following cerebral infarction affecting right dominant side: Secondary | ICD-10-CM | POA: Diagnosis not present

## 2016-02-25 DIAGNOSIS — G629 Polyneuropathy, unspecified: Secondary | ICD-10-CM | POA: Diagnosis not present

## 2016-02-25 DIAGNOSIS — I209 Angina pectoris, unspecified: Secondary | ICD-10-CM | POA: Diagnosis not present

## 2016-02-25 DIAGNOSIS — R1310 Dysphagia, unspecified: Secondary | ICD-10-CM | POA: Diagnosis not present

## 2016-02-25 NOTE — Telephone Encounter (Signed)
New message      Call to verify which medications were stopped and which one's were started for the pt. Please call.

## 2016-02-25 NOTE — Telephone Encounter (Signed)
Spoke w/ pt home health nurse with [facility]. verified the medication changes made by Truitt Merle, NP Diltiazem was d/c Norvasc 5mg  at bedtime was added. Renee verbalized understanding and voiced appreciation for the call back

## 2016-02-28 ENCOUNTER — Ambulatory Visit (INDEPENDENT_AMBULATORY_CARE_PROVIDER_SITE_OTHER): Payer: Medicare Other | Admitting: Interventional Cardiology

## 2016-02-28 ENCOUNTER — Encounter: Payer: Self-pay | Admitting: Interventional Cardiology

## 2016-02-28 VITALS — BP 136/60 | HR 63 | Ht 63.0 in | Wt 141.8 lb

## 2016-02-28 DIAGNOSIS — R1310 Dysphagia, unspecified: Secondary | ICD-10-CM | POA: Diagnosis not present

## 2016-02-28 DIAGNOSIS — I69351 Hemiplegia and hemiparesis following cerebral infarction affecting right dominant side: Secondary | ICD-10-CM | POA: Diagnosis not present

## 2016-02-28 DIAGNOSIS — I2510 Atherosclerotic heart disease of native coronary artery without angina pectoris: Secondary | ICD-10-CM

## 2016-02-28 DIAGNOSIS — R0600 Dyspnea, unspecified: Secondary | ICD-10-CM | POA: Diagnosis not present

## 2016-02-28 DIAGNOSIS — I5032 Chronic diastolic (congestive) heart failure: Secondary | ICD-10-CM | POA: Diagnosis not present

## 2016-02-28 DIAGNOSIS — R413 Other amnesia: Secondary | ICD-10-CM | POA: Diagnosis not present

## 2016-02-28 DIAGNOSIS — I209 Angina pectoris, unspecified: Secondary | ICD-10-CM | POA: Diagnosis not present

## 2016-02-28 DIAGNOSIS — I10 Essential (primary) hypertension: Secondary | ICD-10-CM

## 2016-02-28 DIAGNOSIS — G629 Polyneuropathy, unspecified: Secondary | ICD-10-CM | POA: Diagnosis not present

## 2016-02-28 NOTE — Progress Notes (Signed)
Cardiology Office Note    Date:  02/28/2016   ID:  Kingston Mccrite, DOB Aug 17, 1925, MRN ID:[id number]  PCP:  Henrine Screws, MD  Cardiologist: Sinclair Grooms, MD   Chief Complaint  Patient presents with  . Follow-up    Hypertension    History of Present Illness:  Ellen Marshall is a 82 y.o. female follow-up of coronary artery disease with high-grade obstruction in the diagonal branch. There has been recent difficulty with increased blood pressure. Recent changes have been made by Truitt Merle. No side effects with her recent changes.  When blood pressure was running in the 123XX123 systolic range she complained of increased episodes of chest pain, fullness in her head, and dyspnea. Everything has improved except dyspnea.  Past Medical History:  Diagnosis Date  . Chest pain   . Coronary artery disease   . GERD (gastroesophageal reflux disease)   . Hypertension   . Shortness of breath    laying down  . Stroke [facility])     Past Surgical History:  Procedure Laterality Date  . ABDOMINAL HYSTERECTOMY    . colonscopy    . ESOPHAGOGASTRODUODENOSCOPY    . ESOPHAGOGASTRODUODENOSCOPY  10/10/2011   Procedure: ESOPHAGOGASTRODUODENOSCOPY (EGD);  Surgeon: Winfield Cunas., MD;  Location: [facility] ENDOSCOPY;  Service: Endoscopy;  Laterality: N/A;  with control of bleeding  . ESOPHAGOGASTRODUODENOSCOPY  10/12/2011   Procedure: ESOPHAGOGASTRODUODENOSCOPY (EGD);  Surgeon: Winfield Cunas., MD;  Location: [facility] ENDOSCOPY;  Service: Endoscopy;  Laterality: N/A;  . FLEXIBLE SIGMOIDOSCOPY  10/12/2011   Procedure: FLEXIBLE SIGMOIDOSCOPY;  Surgeon: Winfield Cunas., MD;  Location: [facility] ENDOSCOPY;  Service: Endoscopy;  Laterality: N/A;  . stretching of EGD      Current Medications: Outpatient Medications Prior to Visit  Medication Sig Dispense Refill  . acetaminophen (TYLENOL) 650 MG CR tablet Take 1,300 mg by mouth 3 (three) times daily as needed for pain.    Marland Kitchen ALPRAZolam (XANAX) 0.25 MG tablet Take  0.25 mg by mouth 2 (two) times daily. As needed for anxiety. Takes at 2pm and bedtime    . amLODipine (NORVASC) 5 MG tablet Take 1 tablet (5 mg total) by mouth daily. 30 tablet 6  . aspirin EC 81 MG tablet Take 1 tablet (81 mg total) by mouth daily.    . Calcium Carbonate-Vitamin D (CALCIUM + D) 600-200 MG-UNIT TABS Take 1 tablet by mouth daily.     . clopidogrel (PLAVIX) 75 MG tablet Take 75 mg by mouth daily after lunch.     . diphenoxylate-atropine (LOMOTIL) 2.5-0.025 MG per tablet Take 2 tablets by mouth 4 (four) times daily as needed for diarrhea or loose stools.     Marland Kitchen escitalopram (LEXAPRO) 20 MG tablet Take 20 mg by mouth every morning.    . ferrous sulfate 325 (65 FE) MG tablet Take 325 mg by mouth 2 (two) times daily with a meal.    . fexofenadine (ALLEGRA) 180 MG tablet Take 180 mg by mouth daily.    . folic acid (FOLVITE) A999333 MCG tablet Take 800 mcg by mouth daily.    . isosorbide mononitrate (IMDUR) 120 MG 24 hr tablet TAKE 1 TABLET DAILY 90 tablet 3  . losartan (COZAAR) 100 MG tablet take one tablet (100 mg ) daily  1  . Lutein 20 MG TABS Take 1 tablet by mouth every morning.    . metoprolol (LOPRESSOR) 50 MG tablet Take 25 mg by mouth 2 (two) times daily.    . Multiple  Vitamins-Minerals (CENTRUM SILVER PO) Take 1 tablet by mouth daily at 12 noon.    . nitroGLYCERIN (NITROSTAT) 0.4 MG SL tablet Place 0.4 mg under the tongue every 5 (five) minutes as needed. As needed for chest pain.    . pantoprazole (PROTONIX) 40 MG tablet Take 40 mg by mouth daily.    . ranolazine (RANEXA) 500 MG 12 hr tablet Take 1 tablet (500 mg total) by mouth 2 (two) times daily. 180 tablet 3  . saccharomyces boulardii (FLORASTOR) 250 MG capsule Take 250 mg by mouth daily.     . timolol (TIMOPTIC-XR) 0.5 % ophthalmic gel-forming Place 1 drop into both eyes daily.    . travoprost, benzalkonium, (TRAVATAN) 0.004 % ophthalmic solution Place 1 drop into both eyes at bedtime.    . traZODone (DESYREL) 50 MG tablet  Take 150 mg by mouth at bedtime.     No facility-administered medications prior to visit.      Allergies:   Adhesive [tape]; Amoxicillin; Pregabalin; Simvastatin; and Sulfa antibiotics   Social History   Social History  . Marital status: Widowed    Spouse name: N/A  . Number of children: N/A  . Years of education: N/A   Social History Main Topics  . Smoking status: Never Smoker  . Smokeless tobacco: Never Used  . Alcohol use No     Comment: none in 20 years - 08/12/13  . Drug use: No  . Sexual activity: Not Asked   Other Topics Concern  . None   Social History Narrative  . None     Family History:  The patient's family history includes CAD in her brother, father, mother, and sister; Heart attack in her brother, father, mother, and sister; Hypertension in her sister; Pancreatic cancer in her mother.   ROS:   Please see the history of present illness.    Back pain, muscle pain, difficulty swallowing, nausea, anxiety, difficulty with balance, headache, excessive fatigue, chest pain responsive to nitroglycerin. Chest pain is improved since blood pressure control as noted above. Wakes up at night short of breath  All other systems reviewed and are negative.   PHYSICAL EXAM:   VS:  BP 136/60   Pulse 63   Ht 5\' 3"  (1.6 m)   Wt 141 lb 12.8 oz (64.3 kg)   BMI 25.12 kg/m    GEN: Well nourished, well developed, in no acute distress  HEENT: normal  Neck: no JVD, carotid bruits, or masses Cardiac: RRR; no murmurs, rubs, or gallops,no edema  Respiratory:  clear to auscultation bilaterally, normal work of breathing GI: soft, nontender, nondistended, + BS MS: no deformity or atrophy  Skin: warm and dry, no rash Neuro:  Alert and Oriented x 3, Strength and sensation are intact Psych: euthymic mood, full affect  Wt Readings from Last 3 Encounters:  02/28/16 141 lb 12.8 oz (64.3 kg)  02/18/16 138 lb 12.8 oz (63 kg)  04/01/15 140 lb (63.5 kg)      Studies/Labs Reviewed:    EKG:  EKG  Not performed.  Recent Labs: 03/19/2015: ALT 43; B Natriuretic Peptide 232.0 03/21/2015: Hemoglobin 12.2; Platelets 415 02/18/2016: BUN 19; Creat 1.01; Potassium 4.6; Sodium 128   Lipid Panel No results found for: CHOL, TRIG, HDL, CHOLHDL, VLDL, LDLCALC, LDLDIRECT  Additional studies/ records that were reviewed today include:  Reviewed recent basic metabolic panel.    ASSESSMENT:    1. Chronic diastolic heart failure ([place])   2. Dyspnea   3. Essential hypertension  4. Coronary artery disease involving native coronary artery of native heart without angina pectoris      PLAN:  In order of problems listed above:  1. No obvious volume overload. 2. We'll check a hemoglobin and BNP to determine if dyspnea could be explained by situations that would have a therapeutic remedy. May need to add diuretic therapy of BNP is elevated. 3. Continue amlodipine.. 4. Continue to use nitroglycerin for episodes of chest pain. I strategy is conservative medical management and not invasive.    Medication Adjustments/Labs and Tests Ordered: Current medicines are reviewed at length with the patient today.  Concerns regarding medicines are outlined above.  Medication changes, Labs and Tests ordered today are listed in the Patient Instructions below. Patient Instructions  Medication Instructions:  Your physician recommends that you continue on your current medications as directed. Please refer to the Current Medication list given to you today.   Labwork: Bmet, Bnp, Cbc today  Testing/Procedures: None ordered  Follow-Up: Your physician recommends that you schedule a follow-up appointment as needed   Any Other Special Instructions Will Be Listed Below (If Applicable).     If you need a refill on your cardiac medications before your next appointment, please call your pharmacy.      Signed, Sinclair Grooms, MD  02/28/2016 2:08 PM    [facility] [address], [place], [place]  [address] Phone: [phone]; Fax: [phone]

## 2016-02-28 NOTE — Patient Instructions (Signed)
Medication Instructions:  Your physician recommends that you continue on your current medications as directed. Please refer to the Current Medication list given to you today.   Labwork: Bmet, Bnp, Cbc today  Testing/Procedures: None ordered  Follow-Up: Your physician recommends that you schedule a follow-up appointment as needed   Any Other Special Instructions Will Be Listed Below (If Applicable).     If you need a refill on your cardiac medications before your next appointment, please call your pharmacy.

## 2016-02-29 DIAGNOSIS — I10 Essential (primary) hypertension: Secondary | ICD-10-CM | POA: Diagnosis not present

## 2016-02-29 DIAGNOSIS — R1310 Dysphagia, unspecified: Secondary | ICD-10-CM | POA: Diagnosis not present

## 2016-02-29 DIAGNOSIS — R413 Other amnesia: Secondary | ICD-10-CM | POA: Diagnosis not present

## 2016-02-29 DIAGNOSIS — G629 Polyneuropathy, unspecified: Secondary | ICD-10-CM | POA: Diagnosis not present

## 2016-02-29 DIAGNOSIS — I209 Angina pectoris, unspecified: Secondary | ICD-10-CM | POA: Diagnosis not present

## 2016-02-29 DIAGNOSIS — I69351 Hemiplegia and hemiparesis following cerebral infarction affecting right dominant side: Secondary | ICD-10-CM | POA: Diagnosis not present

## 2016-02-29 LAB — BASIC METABOLIC PANEL
BUN: 19 mg/dL (ref 7–25)
CO2: 25 mmol/L (ref 20–31)
Calcium: 9.1 mg/dL (ref 8.6–10.4)
Chloride: 95 mmol/L — ABNORMAL LOW (ref 98–110)
Creat: 0.87 mg/dL (ref 0.60–0.88)
GLUCOSE: 78 mg/dL (ref 65–99)
POTASSIUM: 4.5 mmol/L (ref 3.5–5.3)
Sodium: 130 mmol/L — ABNORMAL LOW (ref 135–146)

## 2016-02-29 LAB — CBC WITH DIFFERENTIAL/PLATELET
BASOS ABS: 0 {cells}/uL (ref 0–200)
BASOS PCT: 0 %
Eosinophils Absolute: 115 cells/uL (ref 15–500)
Eosinophils Relative: 1 %
HEMATOCRIT: 35.5 % (ref 35.0–45.0)
HEMOGLOBIN: 11.7 g/dL (ref 11.7–15.5)
LYMPHS ABS: 1380 {cells}/uL (ref 850–3900)
MCH: 32.1 pg (ref 27.0–33.0)
MCHC: 33 g/dL (ref 32.0–36.0)
MCV: 97.5 fL (ref 80.0–100.0)
MPV: 11 fL (ref 7.5–12.5)
Monocytes Absolute: 920 cells/uL (ref 200–950)
Monocytes Relative: 8 %
NEUTROS ABS: 9085 {cells}/uL — AB (ref 1500–7800)
Neutrophils Relative %: 79 %
Platelets: 493 10*3/uL — ABNORMAL HIGH (ref 140–400)
RBC: 3.64 MIL/uL — AB (ref 3.80–5.10)
RDW: 15 % (ref 11.0–15.0)
WBC: 11.5 10*3/uL — ABNORMAL HIGH (ref 3.8–10.8)

## 2016-02-29 LAB — BRAIN NATRIURETIC PEPTIDE: BRAIN NATRIURETIC PEPTIDE: 149.6 pg/mL — AB (ref <100)

## 2016-03-01 ENCOUNTER — Encounter: Payer: Self-pay | Admitting: Interventional Cardiology

## 2016-03-01 ENCOUNTER — Telehealth: Payer: Self-pay

## 2016-03-01 DIAGNOSIS — I10 Essential (primary) hypertension: Secondary | ICD-10-CM | POA: Diagnosis not present

## 2016-03-01 DIAGNOSIS — I209 Angina pectoris, unspecified: Secondary | ICD-10-CM | POA: Diagnosis not present

## 2016-03-01 DIAGNOSIS — R413 Other amnesia: Secondary | ICD-10-CM | POA: Diagnosis not present

## 2016-03-01 DIAGNOSIS — R1310 Dysphagia, unspecified: Secondary | ICD-10-CM | POA: Diagnosis not present

## 2016-03-01 DIAGNOSIS — I69351 Hemiplegia and hemiparesis following cerebral infarction affecting right dominant side: Secondary | ICD-10-CM | POA: Diagnosis not present

## 2016-03-01 DIAGNOSIS — G629 Polyneuropathy, unspecified: Secondary | ICD-10-CM | POA: Diagnosis not present

## 2016-03-01 MED ORDER — FUROSEMIDE 20 MG PO TABS: 20.0000 mg | ORAL_TABLET | ORAL | 11 refills | Status: DC

## 2016-03-01 NOTE — Telephone Encounter (Signed)
Pt son aware of lab results and Dr.Smith's recommendation. hemoglobin isn't in normal. Kidney function is normal. BNP is mildly elevated suggesting the possibility of mild fluid buildup in lungs. Start furosemide 20 mg Monday Wednesday and Friday. A copy will be sent to Henrine Screws, MD  Rx sent to pt pharmacy. Results fwd to pcp Pt son verbalized understanding.

## 2016-03-01 NOTE — Telephone Encounter (Signed)
-----   Message from Belva Crome, MD sent at 02/29/2016  6:51 PM EDT ----- Let the patient know hemoglobin isn't in normal. Kidney function is normal. BNP is mildly elevated suggesting the possibility of mild fluid buildup in lungs. Start furosemide 20 mg Monday Wednesday and Friday. A copy will be sent to Henrine Screws, MD

## 2016-03-07 DIAGNOSIS — I69351 Hemiplegia and hemiparesis following cerebral infarction affecting right dominant side: Secondary | ICD-10-CM | POA: Diagnosis not present

## 2016-03-07 DIAGNOSIS — I209 Angina pectoris, unspecified: Secondary | ICD-10-CM | POA: Diagnosis not present

## 2016-03-07 DIAGNOSIS — I10 Essential (primary) hypertension: Secondary | ICD-10-CM | POA: Diagnosis not present

## 2016-03-07 DIAGNOSIS — R413 Other amnesia: Secondary | ICD-10-CM | POA: Diagnosis not present

## 2016-03-07 DIAGNOSIS — G629 Polyneuropathy, unspecified: Secondary | ICD-10-CM | POA: Diagnosis not present

## 2016-03-07 DIAGNOSIS — R1310 Dysphagia, unspecified: Secondary | ICD-10-CM | POA: Diagnosis not present

## 2016-03-09 DIAGNOSIS — I69351 Hemiplegia and hemiparesis following cerebral infarction affecting right dominant side: Secondary | ICD-10-CM | POA: Diagnosis not present

## 2016-03-09 DIAGNOSIS — I10 Essential (primary) hypertension: Secondary | ICD-10-CM | POA: Diagnosis not present

## 2016-03-09 DIAGNOSIS — R1310 Dysphagia, unspecified: Secondary | ICD-10-CM | POA: Diagnosis not present

## 2016-03-09 DIAGNOSIS — R413 Other amnesia: Secondary | ICD-10-CM | POA: Diagnosis not present

## 2016-03-09 DIAGNOSIS — I209 Angina pectoris, unspecified: Secondary | ICD-10-CM | POA: Diagnosis not present

## 2016-03-09 DIAGNOSIS — G629 Polyneuropathy, unspecified: Secondary | ICD-10-CM | POA: Diagnosis not present

## 2016-03-10 DIAGNOSIS — R1310 Dysphagia, unspecified: Secondary | ICD-10-CM | POA: Diagnosis not present

## 2016-03-10 DIAGNOSIS — R413 Other amnesia: Secondary | ICD-10-CM | POA: Diagnosis not present

## 2016-03-10 DIAGNOSIS — I209 Angina pectoris, unspecified: Secondary | ICD-10-CM | POA: Diagnosis not present

## 2016-03-10 DIAGNOSIS — I69351 Hemiplegia and hemiparesis following cerebral infarction affecting right dominant side: Secondary | ICD-10-CM | POA: Diagnosis not present

## 2016-03-10 DIAGNOSIS — I10 Essential (primary) hypertension: Secondary | ICD-10-CM | POA: Diagnosis not present

## 2016-03-10 DIAGNOSIS — G629 Polyneuropathy, unspecified: Secondary | ICD-10-CM | POA: Diagnosis not present

## 2016-03-11 DIAGNOSIS — R413 Other amnesia: Secondary | ICD-10-CM | POA: Diagnosis not present

## 2016-03-11 DIAGNOSIS — G629 Polyneuropathy, unspecified: Secondary | ICD-10-CM | POA: Diagnosis not present

## 2016-03-11 DIAGNOSIS — I69351 Hemiplegia and hemiparesis following cerebral infarction affecting right dominant side: Secondary | ICD-10-CM | POA: Diagnosis not present

## 2016-03-11 DIAGNOSIS — R1310 Dysphagia, unspecified: Secondary | ICD-10-CM | POA: Diagnosis not present

## 2016-03-11 DIAGNOSIS — I209 Angina pectoris, unspecified: Secondary | ICD-10-CM | POA: Diagnosis not present

## 2016-03-11 DIAGNOSIS — I10 Essential (primary) hypertension: Secondary | ICD-10-CM | POA: Diagnosis not present

## 2016-03-13 DIAGNOSIS — I209 Angina pectoris, unspecified: Secondary | ICD-10-CM | POA: Diagnosis not present

## 2016-03-13 DIAGNOSIS — I69351 Hemiplegia and hemiparesis following cerebral infarction affecting right dominant side: Secondary | ICD-10-CM | POA: Diagnosis not present

## 2016-03-13 DIAGNOSIS — R413 Other amnesia: Secondary | ICD-10-CM | POA: Diagnosis not present

## 2016-03-13 DIAGNOSIS — G629 Polyneuropathy, unspecified: Secondary | ICD-10-CM | POA: Diagnosis not present

## 2016-03-13 DIAGNOSIS — I10 Essential (primary) hypertension: Secondary | ICD-10-CM | POA: Diagnosis not present

## 2016-03-13 DIAGNOSIS — R1310 Dysphagia, unspecified: Secondary | ICD-10-CM | POA: Diagnosis not present

## 2016-03-14 DIAGNOSIS — R413 Other amnesia: Secondary | ICD-10-CM | POA: Diagnosis not present

## 2016-03-14 DIAGNOSIS — I209 Angina pectoris, unspecified: Secondary | ICD-10-CM | POA: Diagnosis not present

## 2016-03-14 DIAGNOSIS — I69351 Hemiplegia and hemiparesis following cerebral infarction affecting right dominant side: Secondary | ICD-10-CM | POA: Diagnosis not present

## 2016-03-14 DIAGNOSIS — G629 Polyneuropathy, unspecified: Secondary | ICD-10-CM | POA: Diagnosis not present

## 2016-03-14 DIAGNOSIS — R1310 Dysphagia, unspecified: Secondary | ICD-10-CM | POA: Diagnosis not present

## 2016-03-14 DIAGNOSIS — I10 Essential (primary) hypertension: Secondary | ICD-10-CM | POA: Diagnosis not present

## 2016-03-15 DIAGNOSIS — I69351 Hemiplegia and hemiparesis following cerebral infarction affecting right dominant side: Secondary | ICD-10-CM | POA: Diagnosis not present

## 2016-03-15 DIAGNOSIS — I209 Angina pectoris, unspecified: Secondary | ICD-10-CM | POA: Diagnosis not present

## 2016-03-15 DIAGNOSIS — I10 Essential (primary) hypertension: Secondary | ICD-10-CM | POA: Diagnosis not present

## 2016-03-15 DIAGNOSIS — G629 Polyneuropathy, unspecified: Secondary | ICD-10-CM | POA: Diagnosis not present

## 2016-03-15 DIAGNOSIS — R413 Other amnesia: Secondary | ICD-10-CM | POA: Diagnosis not present

## 2016-03-15 DIAGNOSIS — R1310 Dysphagia, unspecified: Secondary | ICD-10-CM | POA: Diagnosis not present

## 2016-03-16 DIAGNOSIS — I209 Angina pectoris, unspecified: Secondary | ICD-10-CM | POA: Diagnosis not present

## 2016-03-16 DIAGNOSIS — R413 Other amnesia: Secondary | ICD-10-CM | POA: Diagnosis not present

## 2016-03-16 DIAGNOSIS — I10 Essential (primary) hypertension: Secondary | ICD-10-CM | POA: Diagnosis not present

## 2016-03-16 DIAGNOSIS — G629 Polyneuropathy, unspecified: Secondary | ICD-10-CM | POA: Diagnosis not present

## 2016-03-16 DIAGNOSIS — I69351 Hemiplegia and hemiparesis following cerebral infarction affecting right dominant side: Secondary | ICD-10-CM | POA: Diagnosis not present

## 2016-03-16 DIAGNOSIS — R1310 Dysphagia, unspecified: Secondary | ICD-10-CM | POA: Diagnosis not present

## 2016-03-21 DIAGNOSIS — I209 Angina pectoris, unspecified: Secondary | ICD-10-CM | POA: Diagnosis not present

## 2016-03-21 DIAGNOSIS — G629 Polyneuropathy, unspecified: Secondary | ICD-10-CM | POA: Diagnosis not present

## 2016-03-21 DIAGNOSIS — R1310 Dysphagia, unspecified: Secondary | ICD-10-CM | POA: Diagnosis not present

## 2016-03-21 DIAGNOSIS — I10 Essential (primary) hypertension: Secondary | ICD-10-CM | POA: Diagnosis not present

## 2016-03-21 DIAGNOSIS — R413 Other amnesia: Secondary | ICD-10-CM | POA: Diagnosis not present

## 2016-03-21 DIAGNOSIS — I69351 Hemiplegia and hemiparesis following cerebral infarction affecting right dominant side: Secondary | ICD-10-CM | POA: Diagnosis not present

## 2016-03-23 DIAGNOSIS — I209 Angina pectoris, unspecified: Secondary | ICD-10-CM | POA: Diagnosis not present

## 2016-03-23 DIAGNOSIS — I10 Essential (primary) hypertension: Secondary | ICD-10-CM | POA: Diagnosis not present

## 2016-03-23 DIAGNOSIS — G629 Polyneuropathy, unspecified: Secondary | ICD-10-CM | POA: Diagnosis not present

## 2016-03-23 DIAGNOSIS — R1310 Dysphagia, unspecified: Secondary | ICD-10-CM | POA: Diagnosis not present

## 2016-03-23 DIAGNOSIS — R413 Other amnesia: Secondary | ICD-10-CM | POA: Diagnosis not present

## 2016-03-23 DIAGNOSIS — I69351 Hemiplegia and hemiparesis following cerebral infarction affecting right dominant side: Secondary | ICD-10-CM | POA: Diagnosis not present

## 2016-03-27 ENCOUNTER — Ambulatory Visit (HOSPITAL_COMMUNITY)
Admission: RE | Admit: 2016-03-27 | Discharge: 2016-03-27 | Disposition: A | Discharge status: Home / Self Care | Payer: Medicare Other | Source: Ambulatory Visit | Attending: Interventional Cardiology | Admitting: Interventional Cardiology

## 2016-03-27 DIAGNOSIS — I6523 Occlusion and stenosis of bilateral carotid arteries: Secondary | ICD-10-CM | POA: Diagnosis not present

## 2016-03-27 DIAGNOSIS — Z8673 Personal history of transient ischemic attack (TIA), and cerebral infarction without residual deficits: Secondary | ICD-10-CM | POA: Insufficient documentation

## 2016-03-27 DIAGNOSIS — I10 Essential (primary) hypertension: Secondary | ICD-10-CM | POA: Diagnosis not present

## 2016-03-27 DIAGNOSIS — I2510 Atherosclerotic heart disease of native coronary artery without angina pectoris: Secondary | ICD-10-CM | POA: Insufficient documentation

## 2016-03-28 DIAGNOSIS — Z23 Encounter for immunization: Secondary | ICD-10-CM | POA: Diagnosis not present

## 2016-03-28 DIAGNOSIS — I209 Angina pectoris, unspecified: Secondary | ICD-10-CM | POA: Diagnosis not present

## 2016-03-28 DIAGNOSIS — I2510 Atherosclerotic heart disease of native coronary artery without angina pectoris: Secondary | ICD-10-CM | POA: Diagnosis not present

## 2016-03-28 DIAGNOSIS — I69351 Hemiplegia and hemiparesis following cerebral infarction affecting right dominant side: Secondary | ICD-10-CM | POA: Diagnosis not present

## 2016-03-28 DIAGNOSIS — G8320 Monoplegia of upper limb affecting unspecified side: Secondary | ICD-10-CM | POA: Diagnosis not present

## 2016-03-28 DIAGNOSIS — Z0000 Encounter for general adult medical examination without abnormal findings: Secondary | ICD-10-CM | POA: Diagnosis not present

## 2016-03-28 DIAGNOSIS — L309 Dermatitis, unspecified: Secondary | ICD-10-CM | POA: Diagnosis not present

## 2016-03-28 DIAGNOSIS — I5032 Chronic diastolic (congestive) heart failure: Secondary | ICD-10-CM | POA: Diagnosis not present

## 2016-03-28 DIAGNOSIS — Z86010 Personal history of colonic polyps: Secondary | ICD-10-CM | POA: Diagnosis not present

## 2016-03-28 DIAGNOSIS — Z6826 Body mass index (BMI) 26.0-26.9, adult: Secondary | ICD-10-CM | POA: Diagnosis not present

## 2016-03-28 DIAGNOSIS — Z79899 Other long term (current) drug therapy: Secondary | ICD-10-CM | POA: Diagnosis not present

## 2016-03-28 DIAGNOSIS — R1310 Dysphagia, unspecified: Secondary | ICD-10-CM | POA: Diagnosis not present

## 2016-03-28 DIAGNOSIS — I10 Essential (primary) hypertension: Secondary | ICD-10-CM | POA: Diagnosis not present

## 2016-03-28 DIAGNOSIS — R413 Other amnesia: Secondary | ICD-10-CM | POA: Diagnosis not present

## 2016-03-28 DIAGNOSIS — G629 Polyneuropathy, unspecified: Secondary | ICD-10-CM | POA: Diagnosis not present

## 2016-03-28 DIAGNOSIS — I679 Cerebrovascular disease, unspecified: Secondary | ICD-10-CM | POA: Diagnosis not present

## 2016-03-28 DIAGNOSIS — K219 Gastro-esophageal reflux disease without esophagitis: Secondary | ICD-10-CM | POA: Diagnosis not present

## 2016-03-28 DIAGNOSIS — Z1389 Encounter for screening for other disorder: Secondary | ICD-10-CM | POA: Diagnosis not present

## 2016-03-28 DIAGNOSIS — M109 Gout, unspecified: Secondary | ICD-10-CM | POA: Diagnosis not present

## 2016-03-28 DIAGNOSIS — J309 Allergic rhinitis, unspecified: Secondary | ICD-10-CM | POA: Diagnosis not present

## 2016-03-29 DIAGNOSIS — G629 Polyneuropathy, unspecified: Secondary | ICD-10-CM | POA: Diagnosis not present

## 2016-03-29 DIAGNOSIS — I10 Essential (primary) hypertension: Secondary | ICD-10-CM | POA: Diagnosis not present

## 2016-03-29 DIAGNOSIS — I69351 Hemiplegia and hemiparesis following cerebral infarction affecting right dominant side: Secondary | ICD-10-CM | POA: Diagnosis not present

## 2016-03-29 DIAGNOSIS — I209 Angina pectoris, unspecified: Secondary | ICD-10-CM | POA: Diagnosis not present

## 2016-03-29 DIAGNOSIS — R413 Other amnesia: Secondary | ICD-10-CM | POA: Diagnosis not present

## 2016-03-29 DIAGNOSIS — R1310 Dysphagia, unspecified: Secondary | ICD-10-CM | POA: Diagnosis not present

## 2016-04-10 DIAGNOSIS — H04123 Dry eye syndrome of bilateral lacrimal glands: Secondary | ICD-10-CM | POA: Diagnosis not present

## 2016-04-10 DIAGNOSIS — H353134 Nonexudative age-related macular degeneration, bilateral, advanced atrophic with subfoveal involvement: Secondary | ICD-10-CM | POA: Diagnosis not present

## 2016-04-10 DIAGNOSIS — H401134 Primary open-angle glaucoma, bilateral, indeterminate stage: Secondary | ICD-10-CM | POA: Diagnosis not present

## 2016-04-26 ENCOUNTER — Other Ambulatory Visit: Payer: Self-pay | Admitting: *Deleted

## 2016-04-26 ENCOUNTER — Encounter: Payer: Self-pay | Admitting: Nurse Practitioner

## 2016-04-26 MED ORDER — AMLODIPINE BESYLATE 5 MG PO TABS: 5.0000 mg | ORAL_TABLET | Freq: Every day | ORAL | 3 refills | Status: DC

## 2016-05-19 ENCOUNTER — Other Ambulatory Visit: Payer: Self-pay | Admitting: Interventional Cardiology

## 2016-05-19 ENCOUNTER — Other Ambulatory Visit: Payer: Self-pay | Admitting: Physician Assistant

## 2016-06-22 DIAGNOSIS — B029 Zoster without complications: Secondary | ICD-10-CM | POA: Diagnosis not present

## 2016-07-11 ENCOUNTER — Ambulatory Visit
Admission: RE | Admit: 2016-07-11 | Discharge: 2016-07-11 | Disposition: A | Discharge status: Home / Self Care | Payer: Medicare Other | Source: Ambulatory Visit | Attending: Internal Medicine | Admitting: Internal Medicine

## 2016-07-11 ENCOUNTER — Other Ambulatory Visit: Payer: Self-pay | Admitting: Internal Medicine

## 2016-07-11 DIAGNOSIS — R059 Cough, unspecified: Secondary | ICD-10-CM

## 2016-07-11 DIAGNOSIS — M109 Gout, unspecified: Secondary | ICD-10-CM | POA: Diagnosis not present

## 2016-07-11 DIAGNOSIS — R05 Cough: Secondary | ICD-10-CM

## 2016-07-11 DIAGNOSIS — R0602 Shortness of breath: Secondary | ICD-10-CM | POA: Diagnosis not present

## 2016-07-11 DIAGNOSIS — Z79899 Other long term (current) drug therapy: Secondary | ICD-10-CM | POA: Diagnosis not present

## 2016-07-11 DIAGNOSIS — J209 Acute bronchitis, unspecified: Secondary | ICD-10-CM | POA: Diagnosis not present

## 2016-07-11 DIAGNOSIS — I10 Essential (primary) hypertension: Secondary | ICD-10-CM | POA: Diagnosis not present

## 2016-07-11 DIAGNOSIS — I2510 Atherosclerotic heart disease of native coronary artery without angina pectoris: Secondary | ICD-10-CM | POA: Diagnosis not present

## 2016-07-11 DIAGNOSIS — G629 Polyneuropathy, unspecified: Secondary | ICD-10-CM | POA: Diagnosis not present

## 2016-07-13 DIAGNOSIS — R05 Cough: Secondary | ICD-10-CM | POA: Diagnosis not present

## 2016-07-13 DIAGNOSIS — J209 Acute bronchitis, unspecified: Secondary | ICD-10-CM | POA: Diagnosis not present

## 2016-09-05 DIAGNOSIS — I10 Essential (primary) hypertension: Secondary | ICD-10-CM | POA: Diagnosis not present

## 2016-09-05 DIAGNOSIS — I5032 Chronic diastolic (congestive) heart failure: Secondary | ICD-10-CM | POA: Diagnosis not present

## 2016-09-05 DIAGNOSIS — M7580 Other shoulder lesions, unspecified shoulder: Secondary | ICD-10-CM | POA: Diagnosis not present

## 2016-09-05 DIAGNOSIS — I679 Cerebrovascular disease, unspecified: Secondary | ICD-10-CM | POA: Diagnosis not present

## 2016-09-05 DIAGNOSIS — I2510 Atherosclerotic heart disease of native coronary artery without angina pectoris: Secondary | ICD-10-CM | POA: Diagnosis not present

## 2016-10-23 DIAGNOSIS — H353134 Nonexudative age-related macular degeneration, bilateral, advanced atrophic with subfoveal involvement: Secondary | ICD-10-CM | POA: Diagnosis not present

## 2016-10-23 DIAGNOSIS — H04123 Dry eye syndrome of bilateral lacrimal glands: Secondary | ICD-10-CM | POA: Diagnosis not present

## 2016-10-23 DIAGNOSIS — H401134 Primary open-angle glaucoma, bilateral, indeterminate stage: Secondary | ICD-10-CM | POA: Diagnosis not present

## 2016-12-11 ENCOUNTER — Observation Stay (HOSPITAL_COMMUNITY)
Admission: EM | Admit: 2016-12-11 | Discharge: 2016-12-12 | Disposition: A | Discharge status: Home / Self Care | Payer: Medicare Other | Attending: Internal Medicine | Admitting: Internal Medicine

## 2016-12-11 ENCOUNTER — Emergency Department (HOSPITAL_COMMUNITY): Payer: Medicare Other

## 2016-12-11 ENCOUNTER — Encounter (HOSPITAL_COMMUNITY): Payer: Self-pay | Admitting: *Deleted

## 2016-12-11 DIAGNOSIS — Z882 Allergy status to sulfonamides status: Secondary | ICD-10-CM | POA: Diagnosis not present

## 2016-12-11 DIAGNOSIS — R109 Unspecified abdominal pain: Secondary | ICD-10-CM | POA: Diagnosis not present

## 2016-12-11 DIAGNOSIS — Z66 Do not resuscitate: Secondary | ICD-10-CM | POA: Diagnosis not present

## 2016-12-11 DIAGNOSIS — X58XXXA Exposure to other specified factors, initial encounter: Secondary | ICD-10-CM | POA: Insufficient documentation

## 2016-12-11 DIAGNOSIS — K2900 Acute gastritis without bleeding: Secondary | ICD-10-CM | POA: Insufficient documentation

## 2016-12-11 DIAGNOSIS — K2210 Ulcer of esophagus without bleeding: Secondary | ICD-10-CM | POA: Insufficient documentation

## 2016-12-11 DIAGNOSIS — I5032 Chronic diastolic (congestive) heart failure: Secondary | ICD-10-CM | POA: Diagnosis present

## 2016-12-11 DIAGNOSIS — K449 Diaphragmatic hernia without obstruction or gangrene: Secondary | ICD-10-CM | POA: Diagnosis not present

## 2016-12-11 DIAGNOSIS — R52 Pain, unspecified: Secondary | ICD-10-CM | POA: Diagnosis not present

## 2016-12-11 DIAGNOSIS — Z8673 Personal history of transient ischemic attack (TIA), and cerebral infarction without residual deficits: Secondary | ICD-10-CM | POA: Diagnosis not present

## 2016-12-11 DIAGNOSIS — I6529 Occlusion and stenosis of unspecified carotid artery: Secondary | ICD-10-CM | POA: Diagnosis not present

## 2016-12-11 DIAGNOSIS — R079 Chest pain, unspecified: Secondary | ICD-10-CM | POA: Diagnosis not present

## 2016-12-11 DIAGNOSIS — E876 Hypokalemia: Secondary | ICD-10-CM | POA: Diagnosis not present

## 2016-12-11 DIAGNOSIS — R1032 Left lower quadrant pain: Secondary | ICD-10-CM | POA: Diagnosis not present

## 2016-12-11 DIAGNOSIS — I739 Peripheral vascular disease, unspecified: Secondary | ICD-10-CM | POA: Diagnosis not present

## 2016-12-11 DIAGNOSIS — H548 Legal blindness, as defined in USA: Secondary | ICD-10-CM | POA: Diagnosis not present

## 2016-12-11 DIAGNOSIS — I110 Hypertensive heart disease with heart failure: Secondary | ICD-10-CM | POA: Insufficient documentation

## 2016-12-11 DIAGNOSIS — T18128A Food in esophagus causing other injury, initial encounter: Principal | ICD-10-CM | POA: Diagnosis present

## 2016-12-11 DIAGNOSIS — Z79899 Other long term (current) drug therapy: Secondary | ICD-10-CM | POA: Insufficient documentation

## 2016-12-11 DIAGNOSIS — Z7902 Long term (current) use of antithrombotics/antiplatelets: Secondary | ICD-10-CM | POA: Diagnosis not present

## 2016-12-11 DIAGNOSIS — I10 Essential (primary) hypertension: Secondary | ICD-10-CM | POA: Diagnosis present

## 2016-12-11 DIAGNOSIS — Z7982 Long term (current) use of aspirin: Secondary | ICD-10-CM | POA: Diagnosis not present

## 2016-12-11 DIAGNOSIS — K228 Other specified diseases of esophagus: Secondary | ICD-10-CM | POA: Insufficient documentation

## 2016-12-11 DIAGNOSIS — K219 Gastro-esophageal reflux disease without esophagitis: Secondary | ICD-10-CM | POA: Diagnosis not present

## 2016-12-11 DIAGNOSIS — Z8249 Family history of ischemic heart disease and other diseases of the circulatory system: Secondary | ICD-10-CM | POA: Insufficient documentation

## 2016-12-11 DIAGNOSIS — I6523 Occlusion and stenosis of bilateral carotid arteries: Secondary | ICD-10-CM

## 2016-12-11 DIAGNOSIS — K210 Gastro-esophageal reflux disease with esophagitis: Secondary | ICD-10-CM | POA: Diagnosis not present

## 2016-12-11 DIAGNOSIS — I2510 Atherosclerotic heart disease of native coronary artery without angina pectoris: Secondary | ICD-10-CM | POA: Diagnosis not present

## 2016-12-11 DIAGNOSIS — K222 Esophageal obstruction: Secondary | ICD-10-CM | POA: Insufficient documentation

## 2016-12-11 DIAGNOSIS — R0682 Tachypnea, not elsewhere classified: Secondary | ICD-10-CM | POA: Diagnosis not present

## 2016-12-11 DIAGNOSIS — R000 Tachycardia, unspecified: Secondary | ICD-10-CM | POA: Diagnosis not present

## 2016-12-11 DIAGNOSIS — F419 Anxiety disorder, unspecified: Secondary | ICD-10-CM | POA: Insufficient documentation

## 2016-12-11 DIAGNOSIS — Z880 Allergy status to penicillin: Secondary | ICD-10-CM | POA: Insufficient documentation

## 2016-12-11 DIAGNOSIS — I639 Cerebral infarction, unspecified: Secondary | ICD-10-CM | POA: Diagnosis present

## 2016-12-11 DIAGNOSIS — M546 Pain in thoracic spine: Secondary | ICD-10-CM | POA: Diagnosis not present

## 2016-12-11 LAB — URINALYSIS, ROUTINE W REFLEX MICROSCOPIC
BILIRUBIN URINE: NEGATIVE
Bacteria, UA: NONE SEEN
Glucose, UA: NEGATIVE mg/dL
Hgb urine dipstick: NEGATIVE
KETONES UR: 20 mg/dL — AB
Nitrite: NEGATIVE
PH: 6 (ref 5.0–8.0)
Protein, ur: 100 mg/dL — AB
SQUAMOUS EPITHELIAL / LPF: NONE SEEN
Specific Gravity, Urine: 1.013 (ref 1.005–1.030)

## 2016-12-11 LAB — COMPREHENSIVE METABOLIC PANEL
ALBUMIN: 4.3 g/dL (ref 3.5–5.0)
ALT: 16 U/L (ref 14–54)
AST: 22 U/L (ref 15–41)
Alkaline Phosphatase: 58 U/L (ref 38–126)
Anion gap: 12 (ref 5–15)
BUN: 10 mg/dL (ref 6–20)
CHLORIDE: 104 mmol/L (ref 101–111)
CO2: 25 mmol/L (ref 22–32)
CREATININE: 0.95 mg/dL (ref 0.44–1.00)
Calcium: 9.8 mg/dL (ref 8.9–10.3)
GFR calc Af Amer: >60 mL/min (ref >60)
GFR, EST NON AFRICAN AMERICAN: 52 mL/min — AB (ref >60)
GLUCOSE: 108 mg/dL — AB (ref 65–99)
POTASSIUM: 3.4 mmol/L — AB (ref 3.5–5.1)
Sodium: 141 mmol/L (ref 135–145)
Total Bilirubin: 0.9 mg/dL (ref 0.3–1.2)
Total Protein: 7.7 g/dL (ref 6.5–8.1)

## 2016-12-11 LAB — CBC WITH DIFFERENTIAL/PLATELET
BASOS ABS: 0 10*3/uL (ref 0.0–0.1)
BASOS PCT: 0 %
EOS PCT: 0 %
Eosinophils Absolute: 0.1 10*3/uL (ref 0.0–0.7)
HEMATOCRIT: 42.7 % (ref 36.0–46.0)
Hemoglobin: 14.6 g/dL (ref 12.0–15.0)
LYMPHS PCT: 11 %
Lymphs Abs: 1.6 10*3/uL (ref 0.7–4.0)
MCH: 33 pg (ref 26.0–34.0)
MCHC: 34.2 g/dL (ref 30.0–36.0)
MCV: 96.6 fL (ref 78.0–100.0)
MONO ABS: 1.1 10*3/uL — AB (ref 0.1–1.0)
Monocytes Relative: 7 %
NEUTROS ABS: 11.9 10*3/uL — AB (ref 1.7–7.7)
Neutrophils Relative %: 82 %
PLATELETS: 533 10*3/uL — AB (ref 150–400)
RBC: 4.42 MIL/uL (ref 3.87–5.11)
RDW: 14.7 % (ref 11.5–15.5)
WBC: 14.7 10*3/uL — AB (ref 4.0–10.5)

## 2016-12-11 LAB — I-STAT CHEM 8, ED
BUN: 9 mg/dL (ref 6–20)
CHLORIDE: 104 mmol/L (ref 101–111)
Calcium, Ion: 1.09 mmol/L — ABNORMAL LOW (ref 1.15–1.40)
Creatinine, Ser: 1 mg/dL (ref 0.44–1.00)
Glucose, Bld: 107 mg/dL — ABNORMAL HIGH (ref 65–99)
HEMATOCRIT: 46 % (ref 36.0–46.0)
Hemoglobin: 15.6 g/dL — ABNORMAL HIGH (ref 12.0–15.0)
Potassium: 3.3 mmol/L — ABNORMAL LOW (ref 3.5–5.1)
SODIUM: 141 mmol/L (ref 135–145)
TCO2: 26 mmol/L (ref 0–100)

## 2016-12-11 LAB — LIPASE, BLOOD: Lipase: 25 U/L (ref 11–51)

## 2016-12-11 LAB — I-STAT TROPONIN, ED: TROPONIN I, POC: 0.03 ng/mL (ref 0.00–0.08)

## 2016-12-11 LAB — PROTIME-INR
INR: 1.1
Prothrombin Time: 14.2 seconds (ref 11.4–15.2)

## 2016-12-11 LAB — I-STAT CG4 LACTIC ACID, ED
LACTIC ACID, VENOUS: 1.51 mmol/L (ref 0.5–1.9)
Lactic Acid, Venous: 0.62 mmol/L (ref 0.5–1.9)

## 2016-12-11 LAB — TROPONIN I: Troponin I: <0.03 ng/mL (ref <0.03)

## 2016-12-11 MED ORDER — LORAZEPAM 2 MG/ML IJ SOLN: 0.5000 mg | Freq: Once | INTRAMUSCULAR | Status: DC

## 2016-12-11 MED ORDER — IOPAMIDOL (ISOVUE-370) INJECTION 76%
100.0000 mL | Freq: Once | INTRAVENOUS | Status: AC | PRN
  Administered 2016-12-11: 100 mL via INTRAVENOUS

## 2016-12-11 MED ORDER — METOPROLOL TARTRATE 5 MG/5ML IV SOLN
5.0000 mg | INTRAVENOUS | Status: DC | PRN
  Administered 2016-12-11 – 2016-12-12 (×4): 5 mg via INTRAVENOUS
  Filled 2016-12-11 (×4): qty 5

## 2016-12-11 MED ORDER — PANTOPRAZOLE SODIUM 40 MG IV SOLR
40.0000 mg | Freq: Two times a day (BID) | INTRAVENOUS | Status: DC
  Administered 2016-12-11 – 2016-12-12 (×2): 40 mg via INTRAVENOUS
  Filled 2016-12-11 (×2): qty 40

## 2016-12-11 MED ORDER — ONDANSETRON HCL 4 MG/2ML IJ SOLN
4.0000 mg | Freq: Once | INTRAMUSCULAR | Status: AC
  Administered 2016-12-11: 4 mg via INTRAVENOUS
  Filled 2016-12-11: qty 2

## 2016-12-11 MED ORDER — LORAZEPAM 2 MG/ML IJ SOLN
0.5000 mg | Freq: Once | INTRAMUSCULAR | Status: AC
  Administered 2016-12-11: 21:00:00 0.5 mg via INTRAVENOUS
  Filled 2016-12-11: qty 1

## 2016-12-11 MED ORDER — IOPAMIDOL (ISOVUE-370) INJECTION 76%
INTRAVENOUS | Status: AC
Start: 2016-12-11 — End: 2016-12-12
  Filled 2016-12-11: qty 100

## 2016-12-11 MED ORDER — MORPHINE SULFATE (PF) 2 MG/ML IV SOLN: 2.0000 mg | INTRAVENOUS | Status: DC | PRN

## 2016-12-11 MED ORDER — NITROGLYCERIN 2 % TD OINT
0.5000 [in_us] | TOPICAL_OINTMENT | Freq: Four times a day (QID) | TRANSDERMAL | Status: DC
Start: 2016-12-11 — End: 2016-12-12
  Administered 2016-12-11: 0.5 [in_us] via TOPICAL
  Filled 2016-12-11: qty 30

## 2016-12-11 MED ORDER — ONDANSETRON HCL 4 MG/2ML IJ SOLN: 4.0000 mg | Freq: Four times a day (QID) | INTRAMUSCULAR | Status: DC | PRN

## 2016-12-11 MED ORDER — POTASSIUM CHLORIDE IN NACL 40-0.9 MEQ/L-% IV SOLN
INTRAVENOUS | Status: DC
  Administered 2016-12-11 – 2016-12-12 (×2): 75 mL/h via INTRAVENOUS
  Filled 2016-12-11 (×3): qty 1000

## 2016-12-11 MED ORDER — ACETAMINOPHEN 650 MG RE SUPP: 650.0000 mg | Freq: Four times a day (QID) | RECTAL | Status: DC | PRN

## 2016-12-11 MED ORDER — POTASSIUM CHLORIDE IN NACL 20-0.9 MEQ/L-% IV SOLN
Freq: Once | INTRAVENOUS | Status: DC
  Administered 2016-12-11: 16:00:00 via INTRAVENOUS
  Filled 2016-12-11: qty 1000

## 2016-12-11 MED ORDER — MORPHINE SULFATE (PF) 2 MG/ML IV SOLN
4.0000 mg | Freq: Once | INTRAVENOUS | Status: AC
  Administered 2016-12-11: 4 mg via INTRAVENOUS
  Filled 2016-12-11 (×2): qty 2

## 2016-12-11 MED ORDER — ALBUTEROL SULFATE (2.5 MG/3ML) 0.083% IN NEBU: 2.5000 mg | INHALATION_SOLUTION | RESPIRATORY_TRACT | Status: DC | PRN

## 2016-12-11 MED ORDER — SODIUM CHLORIDE 0.9 % IV BOLUS (SEPSIS)
1000.0000 mL | Freq: Once | INTRAVENOUS | Status: AC
  Administered 2016-12-11: 1000 mL via INTRAVENOUS

## 2016-12-11 MED ORDER — LORAZEPAM 2 MG/ML IJ SOLN: 0.2500 mg | Freq: Once | INTRAMUSCULAR | Status: DC

## 2016-12-11 MED ORDER — SODIUM CHLORIDE 0.9 % IV SOLN
INTRAVENOUS | Status: DC
  Filled 2016-12-11: qty 1000

## 2016-12-11 MED ORDER — HYDRALAZINE HCL 20 MG/ML IJ SOLN
10.0000 mg | Freq: Four times a day (QID) | INTRAMUSCULAR | Status: DC | PRN
  Administered 2016-12-11: 10 mg via INTRAVENOUS
  Filled 2016-12-11: qty 1

## 2016-12-11 NOTE — H&P (Signed)
TRH H&P   Patient Demographics:    Ellen Marshall, is a 83 y.o. female  MRN: [id number]   DOB - 1926/02/01  Admit Date - 12/11/2016  Outpatient Primary MD for the patient is Josetta Huddle, MD  Patient coming from: [place]  Chief Complaint  Patient presents with  . Abdominal Pain  . Tachycardia      HPI:    Ellen Marshall  is a 83 y.o. female, With history of reflux, hiatal hernia, legally blind, CAD, stroke in the past on aspirin and Plavix, hypertension, CVA in the past, anxiety, who lives at assisted living facility who presents to the hospital with chief complaints of left-sided chest pain along with nausea vomiting that started this afternoon, she also says that for the last month she has had 9-10 pounds of unintentional weight loss which is very unusual for her, in the ER workup suggestive of large food in the esophagus. I was called to admit. Patient currently is symptom free and in good spirits.    Review of systems:    In addition to the HPI above, Currently symptom free No Fever-chills, No Headache, No changes with Vision or hearing, No problems swallowing food or Liquids, No Chest pain, Cough or Shortness of Breath, No Abdominal pain, No Nausea or Vommitting, Bowel movements are regular, No Blood in stool or Urine, No dysuria, No new skin rashes or bruises, No new joints pains-aches,  No new weakness, tingling, numbness in any extremity, No recent weight gain or loss, No polyuria, polydypsia or polyphagia, No significant Mental Stressors.  A full 10 point Review of Systems was done, except as stated above, all other Review of Systems were negative.   With Past History of the following :      Past Medical History:  Diagnosis Date  . Chest pain   . Coronary artery disease   . GERD (gastroesophageal reflux disease)   . Hypertension   . Shortness of breath    laying down  . Stroke [facility]       Past Surgical History:  Procedure Laterality Date  . ABDOMINAL HYSTERECTOMY    . colonscopy    . ESOPHAGOGASTRODUODENOSCOPY    . ESOPHAGOGASTRODUODENOSCOPY  10/10/2011   Procedure: ESOPHAGOGASTRODUODENOSCOPY (EGD);  Surgeon: Winfield Cunas., MD;  Location: [facility] ENDOSCOPY;  Service: Endoscopy;  Laterality:  N/A;  with control of bleeding  . ESOPHAGOGASTRODUODENOSCOPY  10/12/2011   Procedure: ESOPHAGOGASTRODUODENOSCOPY (EGD);  Surgeon: Winfield Cunas., MD;  Location: [facility] ENDOSCOPY;  Service: Endoscopy;  Laterality: N/A;  . FLEXIBLE SIGMOIDOSCOPY  10/12/2011   Procedure: FLEXIBLE SIGMOIDOSCOPY;  Surgeon: Winfield Cunas., MD;  Location: [facility] ENDOSCOPY;  Service: Endoscopy;  Laterality: N/A;  . stretching of EGD        Social History:     Social History  Substance Use Topics  . Smoking status: Never Smoker  . Smokeless tobacco: Never Used  . Alcohol use No     Comment: none in 20 years - 08/12/13         Family History :     Family History  Problem Relation Age of Onset  . Pancreatic cancer Mother   . CAD Mother   . Heart attack Mother   . CAD Father   . Heart attack Father   . CAD Sister   . Hypertension Sister   . CAD Brother   . Heart attack Sister   . Heart attack Brother        Home Medications:   Prior to Admission medications   Medication Sig Start Date End Date Taking? Authorizing Provider  acetaminophen (TYLENOL) 650 MG CR tablet Take 1,300 mg by mouth 3 (three) times daily as needed for pain.   Yes [provider]  ALPRAZolam (XANAX) 0.25 MG tablet Take 0.25 mg by mouth 2 (two) times daily. As needed for anxiety. Takes at 2pm and bedtime   Yes [provider]  amLODipine (NORVASC) 5 MG tablet Take 1 tablet (5 mg total) by  mouth daily. 04/26/16 12/11/16 Yes Belva Crome, MD  aspirin EC 81 MG tablet Take 1 tablet (81 mg total) by mouth daily. 04/20/14  Yes Weaver, Scott T, PA-C  Calcium Carbonate-Vitamin D (CALCIUM + D) 600-200 MG-UNIT TABS Take 1 tablet by mouth daily.    Yes [provider]  clopidogrel (PLAVIX) 75 MG tablet Take 75 mg by mouth daily after lunch.    Yes [provider]  diphenoxylate-atropine (LOMOTIL) 2.5-0.025 MG per tablet Take 2 tablets by mouth 4 (four) times daily as needed for diarrhea or loose stools.    Yes [provider]  escitalopram (LEXAPRO) 20 MG tablet Take 20 mg by mouth every morning.   Yes [provider]  ferrous sulfate 325 (65 FE) MG tablet Take 325 mg by mouth 2 (two) times daily with a meal.   Yes [provider]  fexofenadine (ALLEGRA) 180 MG tablet Take 180 mg by mouth daily.   Yes [provider]  folic acid (FOLVITE) 921 MCG tablet Take 800 mcg by mouth daily.   Yes [provider]  furosemide (LASIX) 20 MG tablet Take 1 tablet (20 mg total) by mouth every Monday, Wednesday, and Friday. 03/01/16 12/11/16 Yes Belva Crome, MD  isosorbide mononitrate (IMDUR) 120 MG 24 hr tablet TAKE 1 TABLET DAILY 05/19/16  Yes Richardson Dopp T, PA-C  losartan (COZAAR) 100 MG tablet take one tablet (100 mg ) daily 01/27/16  Yes [provider]  Lutein 20 MG TABS Take 1 tablet by mouth every morning.   Yes [provider]  metoprolol (LOPRESSOR) 50 MG tablet Take 25 mg by mouth 2 (two) times daily.   Yes [provider]  Multiple Vitamins-Minerals (CENTRUM SILVER PO) Take 1 tablet by mouth daily at 12 noon.   Yes [provider]  nitroGLYCERIN (  NITROSTAT) 0.4 MG SL tablet Place 0.4 mg under the tongue every 5 (five) minutes as needed. As needed for chest pain.   Yes [provider]  pantoprazole (PROTONIX) 40 MG tablet Take 40 mg by mouth daily.   Yes [provider]  RANEXA 500  MG 12 hr tablet TAKE 1 TABLET TWICE A DAY 05/19/16  Yes Belva Crome, MD  saccharomyces boulardii (FLORASTOR) 250 MG capsule Take 250 mg by mouth daily.    Yes [provider]  timolol (TIMOPTIC-XR) 0.5 % ophthalmic gel-forming Place 1 drop into both eyes daily.   Yes [provider]  travoprost, benzalkonium, (TRAVATAN) 0.004 % ophthalmic solution Place 1 drop into both eyes at bedtime.   Yes [provider]  traZODone (DESYREL) 50 MG tablet Take 150 mg by mouth at bedtime.   Yes [provider]     Allergies:     Allergies  Allergen Reactions  . Adhesive [Tape] Other (See Comments)    Burning, Itching.  . Amoxicillin Diarrhea  . Pregabalin     Weakness, diarrhea   . Simvastatin     Myalgias   . Sulfa Antibiotics Itching     Physical Exam:   Vitals  Blood pressure (!) 183/64, pulse 89, temperature 99 F (37.2 C), resp. rate 16, height 5\' 3"  (1.6 m), weight 59.4 kg (131 lb), SpO2 95 %.   1. General pleasant frail elderly white female lying in bed in NAD,    2. Normal affect and insight, Not Suicidal or Homicidal, Awake Alert, Oriented X 3.  3. No F.N deficits, ALL C.Nerves Intact, Strength 5/5 all 4 extremities, Sensation intact all 4 extremities, Plantars down going.  4. Ears and Eyes appear Normal, Conjunctivae clear, PERRLA. Moist Oral Mucosa.  5. Supple Neck, No JVD, No cervical lymphadenopathy appriciated, No Carotid Bruits.  6. Symmetrical Chest wall movement, Good air movement bilaterally, CTAB.  7. RRR, No Gallops, Rubs or Murmurs, No Parasternal Heave.  8. Positive Bowel Sounds, Abdomen Soft, No tenderness, No organomegaly appriciated,No rebound -guarding or rigidity.  9.  No Cyanosis, Normal Skin Turgor, No Skin Rash or Bruise.  10. Good muscle tone,  joints appear normal , no effusions, Normal ROM.  11. No Palpable Lymph Nodes in Neck or Axillae      Data Review:    CBC  Recent Labs Lab 12/11/16 1025  12/11/16 1036  WBC 14.7*  --   HGB 14.6 15.6*  HCT 42.7 46.0  PLT 533*  --   MCV 96.6  --   MCH 33.0  --   MCHC 34.2  --   RDW 14.7  --   LYMPHSABS 1.6  --   MONOABS 1.1*  --   EOSABS 0.1  --   BASOSABS 0.0  --    ------------------------------------------------------------------------------------------------------------------  Chemistries   Recent Labs Lab 12/11/16 1025 12/11/16 1036  NA 141 141  K 3.4* 3.3*  CL 104 104  CO2 25  --   GLUCOSE 108* 107*  BUN 10 9  CREATININE 0.95 1.00  CALCIUM 9.8  --   AST 22  --   ALT 16  --   ALKPHOS 58  --   BILITOT 0.9  --    ------------------------------------------------------------------------------------------------------------------ estimated creatinine clearance is 32.2 mL/min (by C-G formula based on SCr of 1 mg/dL). ------------------------------------------------------------------------------------------------------------------ No results for input(s): TSH, T4TOTAL, T3FREE, THYROIDAB in the last 72 hours.  Invalid input(s): FREET3  Coagulation profile No results for input(s): INR, PROTIME in the  last 168 hours. ------------------------------------------------------------------------------------------------------------------- No results for input(s): DDIMER in the last 72 hours. -------------------------------------------------------------------------------------------------------------------  Cardiac Enzymes No results for input(s): CKMB, TROPONINI, MYOGLOBIN in the last 168 hours.  Invalid input(s): CK ------------------------------------------------------------------------------------------------------------------    Component Value Date/Time   BNP 149.6 (H) 02/28/2016 1413     ---------------------------------------------------------------------------------------------------------------  Urinalysis    Component Value Date/Time   COLORURINE YELLOW 12/11/2016 1031   APPEARANCEUR CLEAR 12/11/2016 1031    LABSPEC 1.013 12/11/2016 1031   PHURINE 6.0 12/11/2016 1031   GLUCOSEU NEGATIVE 12/11/2016 1031   GLUCOSEU NEGATIVE 10/13/2013 1518   HGBUR NEGATIVE 12/11/2016 1031   BILIRUBINUR NEGATIVE 12/11/2016 1031   KETONESUR 20 (A) 12/11/2016 1031   PROTEINUR 100 (A) 12/11/2016 1031   UROBILINOGEN 0.2 07/30/2014 0730   NITRITE NEGATIVE 12/11/2016 1031   LEUKOCYTESUR TRACE (A) 12/11/2016 1031    ----------------------------------------------------------------------------------------------------------------   Imaging Results:    Ct T-spine No Charge  Result Date: 12/11/2016 CLINICAL DATA:  Back pain EXAM: CT THORACIC SPINE WITHOUT CONTRAST TECHNIQUE: Multidetector CT images of the thoracic were obtained using the standard protocol without intravenous contrast. COMPARISON:  03/19/2015 FINDINGS: Alignment: Normal. Vertebrae: No acute fracture or focal pathologic process. Chronic mild compression fractures of the T6, T7, T8, T9 vertebral body. Paraspinal and other soft tissues: Negative. Disc levels: Mild degenerative disc disease with disc height loss at T2-3, T3-4, T4-5 and T5-6. Anterior bridging osteophyte at T10-11. IMPRESSION: 1.  No acute osseous injury of the thoracic spine. Electronically Signed   By: Kathreen Devoid   On: 12/11/2016 13:08   Dg Chest Portable 1 View  Result Date: 12/11/2016 CLINICAL DATA:  Left-sided chest pain EXAM: PORTABLE CHEST 1 VIEW COMPARISON:  07/11/2016 FINDINGS: The heart size and mediastinal contours are within normal limits. Aortic atherosclerosis. Both lungs are clear. Bilateral glenohumeral joint osteoarthritis. IMPRESSION: 1. No active disease 2.  Aortic Atherosclerosis (ICD10-I70.0). Electronically Signed   By: Kerby Moors M.D.   On: 12/11/2016 10:19   Ct Angio Chest/abd/pel For Dissection W And/or Wo Contrast  Result Date: 12/11/2016 CLINICAL DATA:  Chest and abdominal pain for several hours EXAM: CT ANGIOGRAPHY CHEST, ABDOMEN AND PELVIS TECHNIQUE:  Multidetector CT imaging through the chest, abdomen and pelvis was performed using the standard protocol during bolus administration of intravenous contrast. Multiplanar reconstructed images and MIPs were obtained and reviewed to evaluate the vascular anatomy. CONTRAST:  100 mL Isovue 370. COMPARISON:  None. FINDINGS: CTA CHEST FINDINGS Cardiovascular: Thoracic aorta demonstrates atherosclerotic calcification without aneurysmal dilatation or dissection. The pulmonary artery shows no evidence of pulmonary emboli. Mild coronary calcifications are seen. Mediastinum/Nodes: Thoracic inlet demonstrates a small less than 1 cm hypodensity in the left lobe of the thyroid. The esophagus is dilated and fluid filled with some soft tissue material just above the gastroesophageal junction. It would be difficult to exclude the possibility of a food bolus. Clinical correlation is recommended. Barium swallow may be helpful. No significant hilar or mediastinal adenopathy is noted. Lungs/Pleura: Lungs are well aerated bilaterally without focal infiltrate or sizable effusion. A small patchy area of ground-glass density is again seen in the lateral aspect of the right middle lobe. It is slightly decreased in size measuring approximately 10 mm. Stable 7 mm nodule is noted in the posterior aspect of the left upper lobe best seen on image number 23 of series 11. Musculoskeletal: No acute compression deformity is noted. No rib fractures are seen. Review of the MIP images confirms the above findings. CTA ABDOMEN AND PELVIS FINDINGS VASCULAR Aorta:  Atherosclerotic changes of the aorta are noted without focal aneurysmal dilatation or dissection. Celiac: Atherosclerotic change is noted at the origin with mild stenosis. The more distal celiac axis is within normal limits. SMA: Mild atherosclerotic change at the origin with mild narrowing identified. The distal aspect is within normal limits. Renals: Single renal arteries are identified  bilaterally with atherosclerotic change. Very mild narrowing is noted. IMA: Patent Iliacs: Show atherosclerotic change without aneurysmal dilatation or focal dissection. No focal narrowing is seen. Veins: No definitive venous anomaly is noted. Review of the MIP images confirms the above findings. NON-VASCULAR Hepatobiliary: Diffuse fatty infiltration of the liver is noted. The gallbladder is unremarkable. Pancreas: Pancreas is somewhat atrophic. Spleen: Spleen is within normal limits. Adrenals/Urinary Tract: The adrenal glands are unremarkable. Mild scarring is noted in the kidneys bilaterally with small cysts identified. No calculi or obstructive changes are seen. Bladder is well distended. Stomach/Bowel: Scattered diverticular change of the colon is noted without evidence of diverticulitis. The appendix is not visualized and may have been surgically removed. No inflammatory changes are seen. As described above dilatation of the esophagus is noted with both fluid and soft tissue changes likely related to ingested food stuffs. Possibility of obstructive food bolus would deserve consideration and could simulate the patient is discomfort. Lymphatic: No significant lymphadenopathy is noted. Reproductive: Uterus has been surgically removed. Other: No free fluid is seen. Musculoskeletal: Degenerative changes of the lumbar spine are noted. No compression deformity is seen. Review of the MIP images confirms the above findings. IMPRESSION: No evidence of thoracic aortic aneurysm or dissection. No pulmonary emboli are seen. Stable changes in both lungs as described above consistent with benign etiology given their stability. Changes suggestive of distal esophageal narrowing with ingested fluids and food stuffs within the distal esophagus. Possibility of a partially obstructive food bolus would deserve consideration. Chronic changes in the abdomen and pelvis without acute abnormality. Electronically Signed   By: Inez Catalina  M.D.   On: 12/11/2016 13:00     EKG ordered   Assessment & Plan:     1. Chest pain nausea vomiting. Most likely due to his aphasia constriction with obstructed food. With her history of weight loss and her age malignancy is clearly a concern, we'll keep her nothing by mouth, GI has been called likely EGD in the morning. Keep her nothing by mouth. Will have to hold her aspirin and Plavix as well. Supportive care with IV fluids, continue Nitropaste which may also help with esophageal relaxation. Supportive care.  2. CAD. Patient has advanced CAD is on aspirin, Plavix, Ranexa, Imdur and beta blocker for secondary prevention. These medications obviously will be on hold. As needed IV Lopressor ordered.  3. Hypertension. Nothing by mouth for now due to #1 above, when necessary IV hydralazine and Lopressor ordered. Nitropaste ordered as well.  4. Chronic CHF EF 60%. Currently compensated.  5. Hypokalemia. Replace and monitor.   DVT Prophylaxis   SCDs   AM Labs Ordered, also please review Full Orders  Family Communication: Admission, patients condition and plan of care including tests being ordered have been discussed with the patient and son who indicate understanding and agree with the plan and Code Status.  Code Status DNR  Likely DC to  TBD  Condition Fair  Consults called: [facility]    Admission status: Obs    Time spent in minutes : 30   Lala Lund M.D on 12/11/2016 at 4:04 PM  Between 7am to 7pm - Pager -  [phone] ( page via [facility], text pages only, please mention full 10 digit call back number).  After 7pm go to [email or web address] - password [facility]  [facility] - Office  [phone]

## 2016-12-11 NOTE — ED Triage Notes (Signed)
Pt did not keep water down. Threw up a few times after swallowing. Dr Tyrone Nine aware.

## 2016-12-11 NOTE — ED Notes (Signed)
ATTEMPTED BLOOD DRAW X2 UNSUCCESSFUL 

## 2016-12-11 NOTE — Progress Notes (Signed)
Administered the 1800 dose of Nitro and shortly after patient complained of not being able to breathe good.  Paste was removed.  BP and heart rate was elevated and her breathing was labored, respirations at 40.  EKG was done and MD was notified.  Will continue to monitor.

## 2016-12-11 NOTE — Consult Note (Signed)
Referring Provider:  Dr. Tyrone Nine Primary Care Physician:  Josetta Huddle, MD Primary Gastroenterologist:  Sadie Haber primary.  Reason for Consultation:  Possible food impaction  HPI: Ellen Marshall is a 83 y.o. female with past medical history of coronary artery disease, history of stroke currently on Plavix came into the ER with complaint of nausea and vomiting. GI is consulted for further evaluation of possible food impaction.   Patient seen and examined in the ER at the bedside. Family at bedside. Patient was eating muffin yesterday morning when she started having nausea along with left-sided chest pain with radiation of pain towards left upper quadrant. Patient had few episodes of nausea and vomiting since then. Patient was not able to keep down food or her medications. Because of the left-sided chest pain she underwent CT Angio chest abdomen pelvis to rule out dissection which showed distal esophageal narrowing with possible partially obstructive food bolus.  Last EGD in 2013 for GI bleed was unremarkable. Past Medical History:  Diagnosis Date  . Chest pain   . Coronary artery disease   . GERD (gastroesophageal reflux disease)   . Hypertension   . Shortness of breath    laying down  . Stroke [facility])     Past Surgical History:  Procedure Laterality Date  . ABDOMINAL HYSTERECTOMY    . colonscopy    . ESOPHAGOGASTRODUODENOSCOPY    . ESOPHAGOGASTRODUODENOSCOPY  10/10/2011   Procedure: ESOPHAGOGASTRODUODENOSCOPY (EGD);  Surgeon: Winfield Cunas., MD;  Location: [facility] ENDOSCOPY;  Service: Endoscopy;  Laterality: N/A;  with control of bleeding  . ESOPHAGOGASTRODUODENOSCOPY  10/12/2011   Procedure: ESOPHAGOGASTRODUODENOSCOPY (EGD);  Surgeon: Winfield Cunas., MD;  Location: [facility] ENDOSCOPY;  Service: Endoscopy;  Laterality: N/A;  . FLEXIBLE SIGMOIDOSCOPY  10/12/2011   Procedure: FLEXIBLE SIGMOIDOSCOPY;  Surgeon: Winfield Cunas., MD;  Location: [facility] ENDOSCOPY;  Service: Endoscopy;  Laterality: N/A;  .  stretching of EGD      Prior to Admission medications   Medication Sig Start Date End Date Taking? Authorizing Provider  acetaminophen (TYLENOL) 650 MG CR tablet Take 1,300 mg by mouth 3 (three) times daily as needed for pain.   Yes [provider]  ALPRAZolam (XANAX) 0.25 MG tablet Take 0.25 mg by mouth 2 (two) times daily. As needed for anxiety. Takes at 2pm and bedtime   Yes [provider]  amLODipine (NORVASC) 5 MG tablet Take 1 tablet (5 mg total) by mouth daily. 04/26/16 12/11/16 Yes Belva Crome, MD  aspirin EC 81 MG tablet Take 1 tablet (81 mg total) by mouth daily. 04/20/14  Yes Weaver, Scott T, PA-C  Calcium Carbonate-Vitamin D (CALCIUM + D) 600-200 MG-UNIT TABS Take 1 tablet by mouth daily.    Yes [provider]  clopidogrel (PLAVIX) 75 MG tablet Take 75 mg by mouth daily after lunch.    Yes [provider]  diphenoxylate-atropine (LOMOTIL) 2.5-0.025 MG per tablet Take 2 tablets by mouth 4 (four) times daily as needed for diarrhea or loose stools.    Yes [provider]  escitalopram (LEXAPRO) 20 MG tablet Take 20 mg by mouth every morning.   Yes [provider]  ferrous sulfate 325 (65 FE) MG tablet Take 325 mg by mouth 2 (two) times daily with a meal.   Yes [provider]  fexofenadine (ALLEGRA) 180 MG tablet Take 180 mg by mouth daily.   Yes [provider]  folic acid (FOLVITE) 539 MCG tablet Take 800 mcg by mouth daily.  Yes [provider]  furosemide (LASIX) 20 MG tablet Take 1 tablet (20 mg total) by mouth every Monday, Wednesday, and Friday. 03/01/16 12/11/16 Yes Belva Crome, MD  isosorbide mononitrate (IMDUR) 120 MG 24 hr tablet TAKE 1 TABLET DAILY 05/19/16  Yes Richardson Dopp T, PA-C  losartan (COZAAR) 100 MG tablet take one tablet (100 mg ) daily 01/27/16  Yes [provider]  Lutein 20 MG TABS Take 1 tablet by mouth every morning.   Yes [provider]  metoprolol  (LOPRESSOR) 50 MG tablet Take 25 mg by mouth 2 (two) times daily.   Yes [provider]  Multiple Vitamins-Minerals (CENTRUM SILVER PO) Take 1 tablet by mouth daily at 12 noon.   Yes [provider]  nitroGLYCERIN (NITROSTAT) 0.4 MG SL tablet Place 0.4 mg under the tongue every 5 (five) minutes as needed. As needed for chest pain.   Yes [provider]  pantoprazole (PROTONIX) 40 MG tablet Take 40 mg by mouth daily.   Yes [provider]  RANEXA 500 MG 12 hr tablet TAKE 1 TABLET TWICE A DAY 05/19/16  Yes Belva Crome, MD  saccharomyces boulardii (FLORASTOR) 250 MG capsule Take 250 mg by mouth daily.    Yes [provider]  timolol (TIMOPTIC-XR) 0.5 % ophthalmic gel-forming Place 1 drop into both eyes daily.   Yes [provider]  travoprost, benzalkonium, (TRAVATAN) 0.004 % ophthalmic solution Place 1 drop into both eyes at bedtime.   Yes [provider]  traZODone (DESYREL) 50 MG tablet Take 150 mg by mouth at bedtime.   Yes [provider]    Scheduled Meds: . iopamidol       Continuous Infusions: . 0.9 % NaCl with KCl 20 mEq / L     PRN Meds:.  Allergies as of 12/11/2016 - Review Complete 12/11/2016  Allergen Reaction Noted  . Adhesive [tape] Other (See Comments)   . Amoxicillin Diarrhea 08/12/2013  . Pregabalin  03/19/2015  . Simvastatin    . Sulfa antibiotics Itching 08/12/2013    Family History  Problem Relation Age of Onset  . Pancreatic cancer Mother   . CAD Mother   . Heart attack Mother   . CAD Father   . Heart attack Father   . CAD Sister   . Hypertension Sister   . CAD Brother   . Heart attack Sister   . Heart attack Brother     Social History   Social History  . Marital status: Widowed    Spouse name: N/A  . Number of children: N/A  . Years of education: N/A   Occupational History  . Not on file.   Social History Main Topics  . Smoking status: Never Smoker  . Smokeless tobacco:  Never Used  . Alcohol use No     Comment: none in 20 years - 08/12/13  . Drug use: No  . Sexual activity: Not on file   Other Topics Concern  . Not on file   Social History Narrative  . No narrative on file    Review of Systems: All negative except as stated above in HPI.  Physical Exam: Vital signs: Vitals:   12/11/16 1230 12/11/16 1432  BP: (!) 175/62 (!) 173/63  Pulse: 88 94  Resp: 19 18  Temp: 99.1 F (37.3 C) 99 F (37.2 C)     General:   Alert and cooperative elderly appearing patient. Currently not in acute distress. HEENT: Normocephalic, atraumatic, extraocular movement  intact. No oral lesions Lungs:  Clear throughout to auscultation.   No wheezes, crackles, or rhonchi. No acute distress. Heart:  Regular rate and rhythm; no murmurs, clicks, rubs,  or gallops. Abdomen: Patient has generalized discomfort with the some right upper quadrant tenderness to palpation, bowel sounds present. No peritoneal signs. LE: No edema. Pulses intact Rectal:  Deferred  GI:  Lab Results:  Recent Labs  12/11/16 1025 12/11/16 1036  WBC 14.7*  --   HGB 14.6 15.6*  HCT 42.7 46.0  PLT 533*  --    BMET  Recent Labs  12/11/16 1025 12/11/16 1036  NA 141 141  K 3.4* 3.3*  CL 104 104  CO2 25  --   GLUCOSE 108* 107*  BUN 10 9  CREATININE 0.95 1.00  CALCIUM 9.8  --    LFT  Recent Labs  12/11/16 1025  PROT 7.7  ALBUMIN 4.3  AST 22  ALT 16  ALKPHOS 58  BILITOT 0.9   PT/INR No results for input(s): LABPROT, INR in the last 72 hours.   Studies/Results: Ct T-spine No Charge  Result Date: 12/11/2016 CLINICAL DATA:  Back pain EXAM: CT THORACIC SPINE WITHOUT CONTRAST TECHNIQUE: Multidetector CT images of the thoracic were obtained using the standard protocol without intravenous contrast. COMPARISON:  03/19/2015 FINDINGS: Alignment: Normal. Vertebrae: No acute fracture or focal pathologic process. Chronic mild compression fractures of the T6, T7, T8, T9 vertebral body.  Paraspinal and other soft tissues: Negative. Disc levels: Mild degenerative disc disease with disc height loss at T2-3, T3-4, T4-5 and T5-6. Anterior bridging osteophyte at T10-11. IMPRESSION: 1.  No acute osseous injury of the thoracic spine. Electronically Signed   By: Kathreen Devoid   On: 12/11/2016 13:08   Dg Chest Portable 1 View  Result Date: 12/11/2016 CLINICAL DATA:  Left-sided chest pain EXAM: PORTABLE CHEST 1 VIEW COMPARISON:  07/11/2016 FINDINGS: The heart size and mediastinal contours are within normal limits. Aortic atherosclerosis. Both lungs are clear. Bilateral glenohumeral joint osteoarthritis. IMPRESSION: 1. No active disease 2.  Aortic Atherosclerosis (ICD10-I70.0). Electronically Signed   By: Kerby Moors M.D.   On: 12/11/2016 10:19   Ct Angio Chest/abd/pel For Dissection W And/or Wo Contrast  Result Date: 12/11/2016 CLINICAL DATA:  Chest and abdominal pain for several hours EXAM: CT ANGIOGRAPHY CHEST, ABDOMEN AND PELVIS TECHNIQUE: Multidetector CT imaging through the chest, abdomen and pelvis was performed using the standard protocol during bolus administration of intravenous contrast. Multiplanar reconstructed images and MIPs were obtained and reviewed to evaluate the vascular anatomy. CONTRAST:  100 mL Isovue 370. COMPARISON:  None. FINDINGS: CTA CHEST FINDINGS Cardiovascular: Thoracic aorta demonstrates atherosclerotic calcification without aneurysmal dilatation or dissection. The pulmonary artery shows no evidence of pulmonary emboli. Mild coronary calcifications are seen. Mediastinum/Nodes: Thoracic inlet demonstrates a small less than 1 cm hypodensity in the left lobe of the thyroid. The esophagus is dilated and fluid filled with some soft tissue material just above the gastroesophageal junction. It would be difficult to exclude the possibility of a food bolus. Clinical correlation is recommended. Barium swallow may be helpful. No significant hilar or mediastinal adenopathy is  noted. Lungs/Pleura: Lungs are well aerated bilaterally without focal infiltrate or sizable effusion. A small patchy area of ground-glass density is again seen in the lateral aspect of the right middle lobe. It is slightly decreased in size measuring approximately 10 mm. Stable 7 mm nodule is noted in the posterior aspect of the left upper lobe best seen on image number  23 of series 11. Musculoskeletal: No acute compression deformity is noted. No rib fractures are seen. Review of the MIP images confirms the above findings. CTA ABDOMEN AND PELVIS FINDINGS VASCULAR Aorta: Atherosclerotic changes of the aorta are noted without focal aneurysmal dilatation or dissection. Celiac: Atherosclerotic change is noted at the origin with mild stenosis. The more distal celiac axis is within normal limits. SMA: Mild atherosclerotic change at the origin with mild narrowing identified. The distal aspect is within normal limits. Renals: Single renal arteries are identified bilaterally with atherosclerotic change. Very mild narrowing is noted. IMA: Patent Iliacs: Show atherosclerotic change without aneurysmal dilatation or focal dissection. No focal narrowing is seen. Veins: No definitive venous anomaly is noted. Review of the MIP images confirms the above findings. NON-VASCULAR Hepatobiliary: Diffuse fatty infiltration of the liver is noted. The gallbladder is unremarkable. Pancreas: Pancreas is somewhat atrophic. Spleen: Spleen is within normal limits. Adrenals/Urinary Tract: The adrenal glands are unremarkable. Mild scarring is noted in the kidneys bilaterally with small cysts identified. No calculi or obstructive changes are seen. Bladder is well distended. Stomach/Bowel: Scattered diverticular change of the colon is noted without evidence of diverticulitis. The appendix is not visualized and may have been surgically removed. No inflammatory changes are seen. As described above dilatation of the esophagus is noted with both fluid  and soft tissue changes likely related to ingested food stuffs. Possibility of obstructive food bolus would deserve consideration and could simulate the patient is discomfort. Lymphatic: No significant lymphadenopathy is noted. Reproductive: Uterus has been surgically removed. Other: No free fluid is seen. Musculoskeletal: Degenerative changes of the lumbar spine are noted. No compression deformity is seen. Review of the MIP images confirms the above findings. IMPRESSION: No evidence of thoracic aortic aneurysm or dissection. No pulmonary emboli are seen. Stable changes in both lungs as described above consistent with benign etiology given their stability. Changes suggestive of distal esophageal narrowing with ingested fluids and food stuffs within the distal esophagus. Possibility of a partially obstructive food bolus would deserve consideration. Chronic changes in the abdomen and pelvis without acute abnormality. Electronically Signed   By: Inez Catalina M.D.   On: 12/11/2016 13:00    Impression/Plan: - Nausea and vomiting with CT scan showing possible partial obstructive food bolus. - Left-sided chest pain with along with abdominal pain. CT  Angio  negative for dissection - History of coronary artery disease and stroke. On Plavix. Last dose yesterday morning.  Recommendations ------------------------- - Patient currently resting without any acute distress. She is feeling better. Need for EGD for further evaluation discussed with the patient as well as patient's son. Complications such as infection, bleeding and perforation discussed. Patient and family wants to wait overnight and consider EGD in the morning if symptoms persist. - Recommend nothing by mouth with ice chips.ok to have clear liquid diet if able to keep down water. - GI will follow.   LOS: 0 days   Otis Brace  MD, FACP 12/11/2016, 3:43 PM  Pager [phone] If no answer or after 5 PM call [phone]

## 2016-12-11 NOTE — ED Triage Notes (Signed)
Last 24 hours unable to keep any po's down, went to [place] this am, was sent here for eval. Pain in back, left side and epigastric area. No diarrhea

## 2016-12-11 NOTE — ED Triage Notes (Signed)
Disregard 1700 bp, not correct.

## 2016-12-11 NOTE — ED Provider Notes (Signed)
[place] DEPT Provider Note   CSN: [id number] Arrival date & time: 12/11/16  3220     History   Chief Complaint Chief Complaint  Patient presents with  . Abdominal Pain  . Tachycardia    HPI Ellen Marshall is a 83 y.o. female.  83 yo F with a chief complaint of diffuse abdominal pain. She also has pain to her left chest. Going on for the past couple days. Significant worsening this morning. Has a history of esophageal stricture requiring 6 dilations. She is not able to tolerate anything by mouth including her secretions for the past day. Denies fevers or chills. Denies diarrhea. Eating makes the pain worse. Nothing seems to make it better.   The history is provided by the patient.  Abdominal Pain   This is a new problem. The current episode started 2 days ago. The problem occurs constantly. The problem has been rapidly worsening. The pain is associated with eating. The pain is located in the LUQ, epigastric region and generalized abdominal region. The quality of the pain is sharp and shooting. The pain is at a severity of 10/10. The pain is severe. Associated symptoms include nausea and vomiting. Pertinent negatives include fever, dysuria, headaches, arthralgias and myalgias. Nothing aggravates the symptoms. Nothing relieves the symptoms.    Past Medical History:  Diagnosis Date  . Chest pain   . Coronary artery disease   . GERD (gastroesophageal reflux disease)   . Hypertension   . Shortness of breath    laying down  . Stroke [facility])     Patient Active Problem List   Diagnosis Date Noted  . Coronary artery disease   . Shortness of breath   . Stroke ([place])   . Colitis   . Chronic diastolic heart failure ([place]) 03/25/2014  . Carotid stenosis 03/25/2014  . Bilateral carotid bruits 02/16/2014  . Chest pain   . CAP (community acquired pneumonia) 08/13/2013  . Syncope 08/12/2013  . Bradycardia 08/12/2013  . Hematoma of right parietal scalp 08/12/2013  . HTN  (hypertension) 08/12/2013  . Acute renal failure ([place]) 08/12/2013  . GI bleed 10/10/2011  . Anemia due to blood loss, acute 10/10/2011  . Hyponatremia 10/09/2011  . Diarrhea 10/09/2011  . Vomiting 10/09/2011  . Weakness 10/09/2011  . Leukocytosis 10/09/2011  . Thrombocythemia ([place]) 10/09/2011  . GERD (gastroesophageal reflux disease) 10/09/2011  . CAD (coronary artery disease) 10/09/2011    Past Surgical History:  Procedure Laterality Date  . ABDOMINAL HYSTERECTOMY    . colonscopy    . ESOPHAGOGASTRODUODENOSCOPY    . ESOPHAGOGASTRODUODENOSCOPY  10/10/2011   Procedure: ESOPHAGOGASTRODUODENOSCOPY (EGD);  Surgeon: Winfield Cunas., MD;  Location: [facility] ENDOSCOPY;  Service: Endoscopy;  Laterality: N/A;  with control of bleeding  . ESOPHAGOGASTRODUODENOSCOPY  10/12/2011   Procedure: ESOPHAGOGASTRODUODENOSCOPY (EGD);  Surgeon: Winfield Cunas., MD;  Location: [facility] ENDOSCOPY;  Service: Endoscopy;  Laterality: N/A;  . FLEXIBLE SIGMOIDOSCOPY  10/12/2011   Procedure: FLEXIBLE SIGMOIDOSCOPY;  Surgeon: Winfield Cunas., MD;  Location: [facility] ENDOSCOPY;  Service: Endoscopy;  Laterality: N/A;  . stretching of EGD      OB History    No data available       Home Medications    Prior to Admission medications   Medication Sig Start Date End Date Taking? Authorizing Provider  acetaminophen (TYLENOL) 650 MG CR tablet Take 1,300 mg by mouth 3 (three) times daily as needed for pain.   Yes [provider]  ALPRAZolam Duanne Moron) 0.25 MG  tablet Take 0.25 mg by mouth 2 (two) times daily. As needed for anxiety. Takes at 2pm and bedtime   Yes [provider]  amLODipine (NORVASC) 5 MG tablet Take 1 tablet (5 mg total) by mouth daily. 04/26/16 12/11/16 Yes Belva Crome, MD  aspirin EC 81 MG tablet Take 1 tablet (81 mg total) by mouth daily. 04/20/14  Yes Weaver, Scott T, PA-C  Calcium Carbonate-Vitamin D (CALCIUM + D) 600-200 MG-UNIT TABS Take 1 tablet by mouth daily.    Yes [provider]  clopidogrel (PLAVIX) 75 MG tablet Take 75 mg by mouth daily after lunch.    Yes [provider]  diphenoxylate-atropine (LOMOTIL) 2.5-0.025 MG per tablet Take 2 tablets by mouth 4 (four) times daily as needed for diarrhea or loose stools.    Yes [provider]  escitalopram (LEXAPRO) 20 MG tablet Take 20 mg by mouth every morning.   Yes [provider]  ferrous sulfate 325 (65 FE) MG tablet Take 325 mg by mouth 2 (two) times daily with a meal.   Yes [provider]  fexofenadine (ALLEGRA) 180 MG tablet Take 180 mg by mouth daily.   Yes [provider]  folic acid (FOLVITE) 616 MCG tablet Take 800 mcg by mouth daily.   Yes [provider]  furosemide (LASIX) 20 MG tablet Take 1 tablet (20 mg total) by mouth every Monday, Wednesday, and Friday. 03/01/16 12/11/16 Yes Belva Crome, MD  isosorbide mononitrate (IMDUR) 120 MG 24 hr tablet TAKE 1 TABLET DAILY 05/19/16  Yes Richardson Dopp T, PA-C  losartan (COZAAR) 100 MG tablet take one tablet (100 mg ) daily 01/27/16  Yes [provider]  Lutein 20 MG TABS Take 1 tablet by mouth every morning.   Yes [provider]  metoprolol (LOPRESSOR) 50 MG tablet Take 25 mg by mouth 2 (two) times daily.   Yes [provider]  Multiple Vitamins-Minerals (CENTRUM SILVER PO) Take 1 tablet by mouth daily at 12 noon.   Yes [provider]  nitroGLYCERIN (NITROSTAT) 0.4 MG SL tablet Place 0.4 mg under the tongue every 5 (five) minutes as needed. As needed for chest pain.   Yes [provider]  pantoprazole (PROTONIX) 40 MG tablet Take 40 mg by mouth daily.   Yes [provider]  RANEXA 500 MG 12 hr tablet TAKE 1 TABLET TWICE A DAY 05/19/16  Yes Belva Crome, MD  saccharomyces boulardii (FLORASTOR) 250 MG capsule Take 250 mg by mouth daily.    Yes [provider]  timolol (TIMOPTIC-XR) 0.5 % ophthalmic gel-forming Place 1 drop into both eyes  daily.   Yes [provider]  travoprost, benzalkonium, (TRAVATAN) 0.004 % ophthalmic solution Place 1 drop into both eyes at bedtime.   Yes [provider]  traZODone (DESYREL) 50 MG tablet Take 150 mg by mouth at bedtime.   Yes [provider]    Family History Family History  Problem Relation Age of Onset  . Pancreatic cancer Mother   . CAD Mother   . Heart attack Mother   . CAD Father   . Heart attack Father   . CAD Sister   . Hypertension Sister   . CAD Brother   . Heart attack Sister   . Heart attack Brother     Social History Social History  Substance Use Topics  . Smoking status: Never Smoker  . Smokeless tobacco: Never Used  . Alcohol use No  Comment: none in 20 years - 08/12/13     Allergies   Adhesive [tape]; Amoxicillin; Pregabalin; Simvastatin; and Sulfa antibiotics   Review of Systems Review of Systems  Constitutional: Negative for chills and fever.  HENT: Negative for congestion and rhinorrhea.   Eyes: Negative for redness and visual disturbance.  Respiratory: Negative for shortness of breath and wheezing.   Cardiovascular: Positive for chest pain. Negative for palpitations.  Gastrointestinal: Positive for abdominal pain, nausea and vomiting.  Genitourinary: Negative for dysuria and urgency.  Musculoskeletal: Negative for arthralgias and myalgias.  Skin: Negative for pallor and wound.  Neurological: Negative for dizziness and headaches.     Physical Exam Updated Vital Signs BP (!) 173/63 (BP Location: Left Arm)   Pulse 94   Temp 99 F (37.2 C)   Resp 18   Ht 5\' 3"  (1.6 m)   Wt 59.4 kg (131 lb)   SpO2 95%   BMI 23.21 kg/m   Physical Exam  Constitutional: She is oriented to person, place, and time. She appears well-developed and well-nourished. No distress.  HENT:  Head: Normocephalic and atraumatic.  Eyes: EOM are normal. Pupils are equal, round, and reactive to light.  Neck: Normal range of motion. Neck  supple.  Cardiovascular: Regular rhythm.  Tachycardia present.  Exam reveals no gallop and no friction rub.   No murmur heard. Pulmonary/Chest: Effort normal. She has no wheezes. She has no rales.  Tachypnea  Abdominal: Soft. She exhibits no distension and no mass. There is tenderness (Diffuse but worse to the upper quadrants). There is no guarding.  Musculoskeletal: She exhibits no edema or tenderness.  Neurological: She is alert and oriented to person, place, and time.  Skin: Skin is warm and dry. She is not diaphoretic.  Psychiatric: She has a normal mood and affect. Her behavior is normal.  Nursing note and vitals reviewed.    ED Treatments / Results  Labs (all labs ordered are listed, but only abnormal results are displayed) Labs Reviewed  CBC WITH DIFFERENTIAL/PLATELET - Abnormal; Notable for the following:       Result Value   WBC 14.7 (*)    Platelets 533 (*)    Neutro Abs 11.9 (*)    Monocytes Absolute 1.1 (*)    All other components within normal limits  COMPREHENSIVE METABOLIC PANEL - Abnormal; Notable for the following:    Potassium 3.4 (*)    Glucose, Bld 108 (*)    GFR calc non Af Amer 52 (*)    All other components within normal limits  URINALYSIS, ROUTINE W REFLEX MICROSCOPIC - Abnormal; Notable for the following:    Ketones, ur 20 (*)    Protein, ur 100 (*)    Leukocytes, UA TRACE (*)    All other components within normal limits  I-STAT CHEM 8, ED - Abnormal; Notable for the following:    Potassium 3.3 (*)    Glucose, Bld 107 (*)    Calcium, Ion 1.09 (*)    Hemoglobin 15.6 (*)    All other components within normal limits  LIPASE, BLOOD  I-STAT CG4 LACTIC ACID, ED  I-STAT TROPOININ, ED  I-STAT CG4 LACTIC ACID, ED    EKG  EKG Interpretation None       Radiology Ct T-spine No Charge  Result Date: 12/11/2016 CLINICAL DATA:  Back pain EXAM: CT THORACIC SPINE WITHOUT CONTRAST TECHNIQUE: Multidetector CT images of the thoracic were obtained using the  standard protocol without intravenous contrast. COMPARISON:  03/19/2015 FINDINGS: Alignment:  Normal. Vertebrae: No acute fracture or focal pathologic process. Chronic mild compression fractures of the T6, T7, T8, T9 vertebral body. Paraspinal and other soft tissues: Negative. Disc levels: Mild degenerative disc disease with disc height loss at T2-3, T3-4, T4-5 and T5-6. Anterior bridging osteophyte at T10-11. IMPRESSION: 1.  No acute osseous injury of the thoracic spine. Electronically Signed   By: Kathreen Devoid   On: 12/11/2016 13:08   Dg Chest Portable 1 View  Result Date: 12/11/2016 CLINICAL DATA:  Left-sided chest pain EXAM: PORTABLE CHEST 1 VIEW COMPARISON:  07/11/2016 FINDINGS: The heart size and mediastinal contours are within normal limits. Aortic atherosclerosis. Both lungs are clear. Bilateral glenohumeral joint osteoarthritis. IMPRESSION: 1. No active disease 2.  Aortic Atherosclerosis (ICD10-I70.0). Electronically Signed   By: Kerby Moors M.D.   On: 12/11/2016 10:19   Ct Angio Chest/abd/pel For Dissection W And/or Wo Contrast  Result Date: 12/11/2016 CLINICAL DATA:  Chest and abdominal pain for several hours EXAM: CT ANGIOGRAPHY CHEST, ABDOMEN AND PELVIS TECHNIQUE: Multidetector CT imaging through the chest, abdomen and pelvis was performed using the standard protocol during bolus administration of intravenous contrast. Multiplanar reconstructed images and MIPs were obtained and reviewed to evaluate the vascular anatomy. CONTRAST:  100 mL Isovue 370. COMPARISON:  None. FINDINGS: CTA CHEST FINDINGS Cardiovascular: Thoracic aorta demonstrates atherosclerotic calcification without aneurysmal dilatation or dissection. The pulmonary artery shows no evidence of pulmonary emboli. Mild coronary calcifications are seen. Mediastinum/Nodes: Thoracic inlet demonstrates a small less than 1 cm hypodensity in the left lobe of the thyroid. The esophagus is dilated and fluid filled with some soft tissue  material just above the gastroesophageal junction. It would be difficult to exclude the possibility of a food bolus. Clinical correlation is recommended. Barium swallow may be helpful. No significant hilar or mediastinal adenopathy is noted. Lungs/Pleura: Lungs are well aerated bilaterally without focal infiltrate or sizable effusion. A small patchy area of ground-glass density is again seen in the lateral aspect of the right middle lobe. It is slightly decreased in size measuring approximately 10 mm. Stable 7 mm nodule is noted in the posterior aspect of the left upper lobe best seen on image number 23 of series 11. Musculoskeletal: No acute compression deformity is noted. No rib fractures are seen. Review of the MIP images confirms the above findings. CTA ABDOMEN AND PELVIS FINDINGS VASCULAR Aorta: Atherosclerotic changes of the aorta are noted without focal aneurysmal dilatation or dissection. Celiac: Atherosclerotic change is noted at the origin with mild stenosis. The more distal celiac axis is within normal limits. SMA: Mild atherosclerotic change at the origin with mild narrowing identified. The distal aspect is within normal limits. Renals: Single renal arteries are identified bilaterally with atherosclerotic change. Very mild narrowing is noted. IMA: Patent Iliacs: Show atherosclerotic change without aneurysmal dilatation or focal dissection. No focal narrowing is seen. Veins: No definitive venous anomaly is noted. Review of the MIP images confirms the above findings. NON-VASCULAR Hepatobiliary: Diffuse fatty infiltration of the liver is noted. The gallbladder is unremarkable. Pancreas: Pancreas is somewhat atrophic. Spleen: Spleen is within normal limits. Adrenals/Urinary Tract: The adrenal glands are unremarkable. Mild scarring is noted in the kidneys bilaterally with small cysts identified. No calculi or obstructive changes are seen. Bladder is well distended. Stomach/Bowel: Scattered diverticular change  of the colon is noted without evidence of diverticulitis. The appendix is not visualized and may have been surgically removed. No inflammatory changes are seen. As described above dilatation of the esophagus is noted with  both fluid and soft tissue changes likely related to ingested food stuffs. Possibility of obstructive food bolus would deserve consideration and could simulate the patient is discomfort. Lymphatic: No significant lymphadenopathy is noted. Reproductive: Uterus has been surgically removed. Other: No free fluid is seen. Musculoskeletal: Degenerative changes of the lumbar spine are noted. No compression deformity is seen. Review of the MIP images confirms the above findings. IMPRESSION: No evidence of thoracic aortic aneurysm or dissection. No pulmonary emboli are seen. Stable changes in both lungs as described above consistent with benign etiology given their stability. Changes suggestive of distal esophageal narrowing with ingested fluids and food stuffs within the distal esophagus. Possibility of a partially obstructive food bolus would deserve consideration. Chronic changes in the abdomen and pelvis without acute abnormality. Electronically Signed   By: Inez Catalina M.D.   On: 12/11/2016 13:00    Procedures Procedures (including critical care time)  Medications Ordered in ED Medications  iopamidol (ISOVUE-370) 76 % injection (not administered)  0.9 % NaCl with KCl 20 mEq/ L  infusion (not administered)  sodium chloride 0.9 % bolus 1,000 mL (0 mLs Intravenous Stopped 12/11/16 1143)  morphine 2 MG/ML injection 4 mg (4 mg Intravenous Given 12/11/16 1034)  ondansetron (ZOFRAN) injection 4 mg (4 mg Intravenous Given 12/11/16 1034)  iopamidol (ISOVUE-370) 76 % injection 100 mL (100 mLs Intravenous Contrast Given 12/11/16 1215)     Initial Impression / Assessment and Plan / ED Course  I have reviewed the triage vital signs and the nursing notes.  Pertinent labs & imaging results that were  available during my care of the patient were reviewed by me and considered in my medical decision making (see chart for details).     83 yo F With a chief complaint of chest pain and abdominal pain. Exquisitely tender on abdominal exam. Appears uncomfortable  has tachypnea as well as is slightly diaphoretic. Tachycardia and hypertension. Some concern for aortic dissection will obtain a CT angiogram of the chest abdomen and pelvis. She has some focal tenderness to the left ribs will obtain a reformats of the T-spine to evaluate for fracture.  The patient's imaging his negative for dissection. Patient is noted to have a esophageal food impaction. Discussed with Dr. Alessandra Bevels, who came and evaluated the patient. Based on his review of the imaging is not feel the patient is a good candidate for immediate endoscopy. Recommended overnight observation and reevaluation in the morning and at that point they would discuss EGD.   The patients results and plan were reviewed and discussed.   Any x-rays performed were independently reviewed by myself.   Differential diagnosis were considered with the presenting HPI.  Medications  iopamidol (ISOVUE-370) 76 % injection (not administered)  ondansetron (ZOFRAN) injection 4 mg (not administered)  albuterol (PROVENTIL) (2.5 MG/3ML) 0.083% nebulizer solution 2.5 mg (not administered)  hydrALAZINE (APRESOLINE) injection 10 mg (not administered)  nitroGLYCERIN (NITROGLYN) 2 % ointment 0.5 inch (not administered)  metoprolol tartrate (LOPRESSOR) injection 5 mg (not administered)  pantoprazole (PROTONIX) injection 40 mg (not administered)  acetaminophen (TYLENOL) suppository 650 mg (not administered)  0.9 % NaCl with KCl 40 mEq / L  infusion (not administered)  sodium chloride 0.9 % bolus 1,000 mL (0 mLs Intravenous Stopped 12/11/16 1143)  morphine 2 MG/ML injection 4 mg (4 mg Intravenous Given 12/11/16 1034)  ondansetron (ZOFRAN) injection 4 mg (4 mg Intravenous  Given 12/11/16 1034)  iopamidol (ISOVUE-370) 76 % injection 100 mL (100 mLs Intravenous Contrast Given 12/11/16 1215)  Vitals:   12/11/16 1032 12/11/16 1230 12/11/16 1432 12/11/16 1557  BP:  (!) 175/62 (!) 173/63 (!) 183/64  Pulse:  88 94 89  Resp:  19 18 16   Temp: 99.5 F (37.5 C) 99.1 F (37.3 C) 99 F (37.2 C)   TempSrc: Rectal Oral    SpO2:  100% 95% 95%  Weight:      Height:        Final diagnoses:  Esophageal obstruction due to food impaction    Admission/ observation were discussed with the admitting physician, patient and/or family and they are comfortable with the plan.   Final Clinical Impressions(s) / ED Diagnoses   Final diagnoses:  Esophageal obstruction due to food impaction    New Prescriptions New Prescriptions   No medications on file     Deno Etienne, DO 12/11/16 1601

## 2016-12-12 ENCOUNTER — Encounter (HOSPITAL_COMMUNITY)
Admission: EM | Disposition: A | Discharge status: Home / Self Care | Payer: Self-pay | Source: Home / Self Care | Attending: Emergency Medicine

## 2016-12-12 ENCOUNTER — Observation Stay (HOSPITAL_COMMUNITY): Payer: Medicare Other | Admitting: Anesthesiology

## 2016-12-12 ENCOUNTER — Encounter (HOSPITAL_COMMUNITY): Payer: Self-pay

## 2016-12-12 DIAGNOSIS — K222 Esophageal obstruction: Secondary | ICD-10-CM | POA: Diagnosis not present

## 2016-12-12 DIAGNOSIS — K2900 Acute gastritis without bleeding: Secondary | ICD-10-CM | POA: Diagnosis not present

## 2016-12-12 DIAGNOSIS — I5032 Chronic diastolic (congestive) heart failure: Secondary | ICD-10-CM | POA: Diagnosis not present

## 2016-12-12 DIAGNOSIS — K228 Other specified diseases of esophagus: Secondary | ICD-10-CM | POA: Diagnosis not present

## 2016-12-12 DIAGNOSIS — Z8249 Family history of ischemic heart disease and other diseases of the circulatory system: Secondary | ICD-10-CM | POA: Diagnosis not present

## 2016-12-12 DIAGNOSIS — I739 Peripheral vascular disease, unspecified: Secondary | ICD-10-CM | POA: Diagnosis not present

## 2016-12-12 DIAGNOSIS — Z8673 Personal history of transient ischemic attack (TIA), and cerebral infarction without residual deficits: Secondary | ICD-10-CM | POA: Diagnosis not present

## 2016-12-12 DIAGNOSIS — I110 Hypertensive heart disease with heart failure: Secondary | ICD-10-CM | POA: Diagnosis not present

## 2016-12-12 DIAGNOSIS — T18128A Food in esophagus causing other injury, initial encounter: Secondary | ICD-10-CM | POA: Diagnosis not present

## 2016-12-12 DIAGNOSIS — H548 Legal blindness, as defined in USA: Secondary | ICD-10-CM | POA: Diagnosis not present

## 2016-12-12 DIAGNOSIS — K2210 Ulcer of esophagus without bleeding: Secondary | ICD-10-CM | POA: Diagnosis not present

## 2016-12-12 DIAGNOSIS — K449 Diaphragmatic hernia without obstruction or gangrene: Secondary | ICD-10-CM | POA: Diagnosis not present

## 2016-12-12 DIAGNOSIS — R1310 Dysphagia, unspecified: Secondary | ICD-10-CM | POA: Diagnosis not present

## 2016-12-12 HISTORY — PX: ESOPHAGOGASTRODUODENOSCOPY (EGD) WITH PROPOFOL: SHX5813

## 2016-12-12 LAB — BASIC METABOLIC PANEL
ANION GAP: 7 (ref 5–15)
BUN: 9 mg/dL (ref 6–20)
CO2: 24 mmol/L (ref 22–32)
Calcium: 8.6 mg/dL — ABNORMAL LOW (ref 8.9–10.3)
Chloride: 112 mmol/L — ABNORMAL HIGH (ref 101–111)
Creatinine, Ser: 0.83 mg/dL (ref 0.44–1.00)
GFR calc Af Amer: >60 mL/min (ref >60)
GLUCOSE: 94 mg/dL (ref 65–99)
POTASSIUM: 3.9 mmol/L (ref 3.5–5.1)
Sodium: 143 mmol/L (ref 135–145)

## 2016-12-12 LAB — CBC
HEMATOCRIT: 37.6 % (ref 36.0–46.0)
HEMOGLOBIN: 12.3 g/dL (ref 12.0–15.0)
MCH: 32.3 pg (ref 26.0–34.0)
MCHC: 32.7 g/dL (ref 30.0–36.0)
MCV: 98.7 fL (ref 78.0–100.0)
Platelets: 444 10*3/uL — ABNORMAL HIGH (ref 150–400)
RBC: 3.81 MIL/uL — ABNORMAL LOW (ref 3.87–5.11)
RDW: 14.9 % (ref 11.5–15.5)
WBC: 15 10*3/uL — ABNORMAL HIGH (ref 4.0–10.5)

## 2016-12-12 LAB — TROPONIN I: Troponin I: <0.03 ng/mL (ref <0.03)

## 2016-12-12 SURGERY — ESOPHAGOGASTRODUODENOSCOPY (EGD) WITH PROPOFOL
Anesthesia: General

## 2016-12-12 MED ORDER — LABETALOL HCL 5 MG/ML IV SOLN
INTRAVENOUS | Status: AC
  Filled 2016-12-12: qty 4

## 2016-12-12 MED ORDER — PROPOFOL 10 MG/ML IV BOLUS
INTRAVENOUS | Status: AC
  Filled 2016-12-12: qty 40

## 2016-12-12 MED ORDER — LABETALOL HCL 5 MG/ML IV SOLN
INTRAVENOUS | Status: DC | PRN
  Administered 2016-12-12: 5 mg via INTRAVENOUS

## 2016-12-12 MED ORDER — SUCCINYLCHOLINE CHLORIDE 200 MG/10ML IV SOSY
PREFILLED_SYRINGE | INTRAVENOUS | Status: AC
  Filled 2016-12-12: qty 10

## 2016-12-12 MED ORDER — PANTOPRAZOLE SODIUM 40 MG PO TBEC: 40.0000 mg | DELAYED_RELEASE_TABLET | Freq: Two times a day (BID) | ORAL | 0 refills | Status: AC

## 2016-12-12 MED ORDER — SUCCINYLCHOLINE CHLORIDE 200 MG/10ML IV SOSY
PREFILLED_SYRINGE | INTRAVENOUS | Status: DC | PRN
  Administered 2016-12-12: 120 mg via INTRAVENOUS

## 2016-12-12 MED ORDER — PROPOFOL 10 MG/ML IV BOLUS
INTRAVENOUS | Status: DC | PRN
  Administered 2016-12-12: 100 mg via INTRAVENOUS

## 2016-12-12 MED ORDER — LIDOCAINE 2% (20 MG/ML) 5 ML SYRINGE
INTRAMUSCULAR | Status: DC | PRN
  Administered 2016-12-12: 80 mg via INTRAVENOUS

## 2016-12-12 MED ORDER — PANTOPRAZOLE SODIUM 40 MG PO TBEC: 40.0000 mg | DELAYED_RELEASE_TABLET | Freq: Two times a day (BID) | ORAL | 0 refills | Status: DC

## 2016-12-12 MED ORDER — EPHEDRINE 5 MG/ML INJ
INTRAVENOUS | Status: AC
  Filled 2016-12-12: qty 10

## 2016-12-12 MED ORDER — SODIUM CHLORIDE 0.9 % IV SOLN: INTRAVENOUS | Status: DC

## 2016-12-12 MED ORDER — LIDOCAINE 2% (20 MG/ML) 5 ML SYRINGE
INTRAMUSCULAR | Status: AC
  Filled 2016-12-12: qty 5

## 2016-12-12 MED ORDER — LACTATED RINGERS IV SOLN
INTRAVENOUS | Status: DC | PRN
  Administered 2016-12-12: 07:00:00 via INTRAVENOUS

## 2016-12-12 SURGICAL SUPPLY — 14 items

## 2016-12-12 NOTE — Anesthesia Preprocedure Evaluation (Signed)
Anesthesia Evaluation  Patient identified by MRN, date of birth, ID band Patient awake    Reviewed: Allergy & Precautions, NPO status , Patient's Chart, lab work & pertinent test results  Airway Mallampati: II  TM Distance: >3 FB Neck ROM: Full    Dental no notable dental hx.    Pulmonary neg pulmonary ROS,    Pulmonary exam normal breath sounds clear to auscultation       Cardiovascular hypertension, + CAD and + Peripheral Vascular Disease  Normal cardiovascular exam Rhythm:Regular Rate:Normal     Neuro/Psych CVA negative psych ROS   GI/Hepatic negative GI ROS, Neg liver ROS,   Endo/Other  negative endocrine ROS  Renal/GU negative Renal ROS  negative genitourinary   Musculoskeletal negative musculoskeletal ROS (+)   Abdominal   Peds negative pediatric ROS (+)  Hematology negative hematology ROS (+)   Anesthesia Other Findings   Reproductive/Obstetrics negative OB ROS                             Anesthesia Physical Anesthesia Plan  ASA: III  Anesthesia Plan: General   Post-op Pain Management:    Induction: Intravenous  Airway Management Planned: Oral ETT  Additional Equipment:   Intra-op Plan:   Post-operative Plan: Extubation in OR  Informed Consent: I have reviewed the patients History and Physical, chart, labs and discussed the procedure including the risks, benefits and alternatives for the proposed anesthesia with the patient or authorized representative who has indicated his/her understanding and acceptance.   Dental advisory given  Plan Discussed with: CRNA and Surgeon  Anesthesia Plan Comments:         Anesthesia Quick Evaluation

## 2016-12-12 NOTE — Progress Notes (Signed)
Patient's BP decreasing at this time.  Notified NP to inform of BP levels.  Metoprolol given, Ativan given for anxiety.  Patient is less anxious.  Denies any pain at this time.  Will monitor  VS for any ongoing changes.

## 2016-12-12 NOTE — Interval H&P Note (Signed)
History and Physical Interval Note:  12/12/2016 7:51 AM  Ellen Marshall  has presented today for surgery, with the diagnosis of Possible food impaction  The various methods of treatment have been discussed with the patient and family. After consideration of risks, benefits and other options for treatment, the patient has consented to  Procedure(s): ESOPHAGOGASTRODUODENOSCOPY (EGD) WITH PROPOFOL (N/A) as a surgical intervention .  The patient's history has been reviewed, patient examined, no change in status, stable for surgery.  I have reviewed the patient's chart and labs.  Questions were answered to the patient's satisfaction.     [place] C.

## 2016-12-12 NOTE — H&P (View-Only) (Signed)
Referring Provider:  Dr. Tyrone Nine Primary Care Physician:  Josetta Huddle, MD Primary Gastroenterologist:  Sadie Haber primary.  Reason for Consultation:  Possible food impaction  HPI: Ellen Marshall is a 83 y.o. female with past medical history of coronary artery disease, history of stroke currently on Plavix came into the ER with complaint of nausea and vomiting. GI is consulted for further evaluation of possible food impaction.   Patient seen and examined in the ER at the bedside. Family at bedside. Patient was eating muffin yesterday morning when she started having nausea along with left-sided chest pain with radiation of pain towards left upper quadrant. Patient had few episodes of nausea and vomiting since then. Patient was not able to keep down food or her medications. Because of the left-sided chest pain she underwent CT Angio chest abdomen pelvis to rule out dissection which showed distal esophageal narrowing with possible partially obstructive food bolus.  Last EGD in 2013 for GI bleed was unremarkable. Past Medical History:  Diagnosis Date  . Chest pain   . Coronary artery disease   . GERD (gastroesophageal reflux disease)   . Hypertension   . Shortness of breath    laying down  . Stroke [facility])     Past Surgical History:  Procedure Laterality Date  . ABDOMINAL HYSTERECTOMY    . colonscopy    . ESOPHAGOGASTRODUODENOSCOPY    . ESOPHAGOGASTRODUODENOSCOPY  10/10/2011   Procedure: ESOPHAGOGASTRODUODENOSCOPY (EGD);  Surgeon: Winfield Cunas., MD;  Location: [facility] ENDOSCOPY;  Service: Endoscopy;  Laterality: N/A;  with control of bleeding  . ESOPHAGOGASTRODUODENOSCOPY  10/12/2011   Procedure: ESOPHAGOGASTRODUODENOSCOPY (EGD);  Surgeon: Winfield Cunas., MD;  Location: [facility] ENDOSCOPY;  Service: Endoscopy;  Laterality: N/A;  . FLEXIBLE SIGMOIDOSCOPY  10/12/2011   Procedure: FLEXIBLE SIGMOIDOSCOPY;  Surgeon: Winfield Cunas., MD;  Location: [facility] ENDOSCOPY;  Service: Endoscopy;  Laterality: N/A;  .  stretching of EGD      Prior to Admission medications   Medication Sig Start Date End Date Taking? Authorizing Provider  acetaminophen (TYLENOL) 650 MG CR tablet Take 1,300 mg by mouth 3 (three) times daily as needed for pain.   Yes [provider]  ALPRAZolam (XANAX) 0.25 MG tablet Take 0.25 mg by mouth 2 (two) times daily. As needed for anxiety. Takes at 2pm and bedtime   Yes [provider]  amLODipine (NORVASC) 5 MG tablet Take 1 tablet (5 mg total) by mouth daily. 04/26/16 12/11/16 Yes Belva Crome, MD  aspirin EC 81 MG tablet Take 1 tablet (81 mg total) by mouth daily. 04/20/14  Yes Weaver, Scott T, PA-C  Calcium Carbonate-Vitamin D (CALCIUM + D) 600-200 MG-UNIT TABS Take 1 tablet by mouth daily.    Yes [provider]  clopidogrel (PLAVIX) 75 MG tablet Take 75 mg by mouth daily after lunch.    Yes [provider]  diphenoxylate-atropine (LOMOTIL) 2.5-0.025 MG per tablet Take 2 tablets by mouth 4 (four) times daily as needed for diarrhea or loose stools.    Yes [provider]  escitalopram (LEXAPRO) 20 MG tablet Take 20 mg by mouth every morning.   Yes [provider]  ferrous sulfate 325 (65 FE) MG tablet Take 325 mg by mouth 2 (two) times daily with a meal.   Yes [provider]  fexofenadine (ALLEGRA) 180 MG tablet Take 180 mg by mouth daily.   Yes [provider]  folic acid (FOLVITE) 381 MCG tablet Take 800 mcg by mouth daily.  Yes [provider]  furosemide (LASIX) 20 MG tablet Take 1 tablet (20 mg total) by mouth every Monday, Wednesday, and Friday. 03/01/16 12/11/16 Yes Belva Crome, MD  isosorbide mononitrate (IMDUR) 120 MG 24 hr tablet TAKE 1 TABLET DAILY 05/19/16  Yes Richardson Dopp T, PA-C  losartan (COZAAR) 100 MG tablet take one tablet (100 mg ) daily 01/27/16  Yes [provider]  Lutein 20 MG TABS Take 1 tablet by mouth every morning.   Yes [provider]  metoprolol  (LOPRESSOR) 50 MG tablet Take 25 mg by mouth 2 (two) times daily.   Yes [provider]  Multiple Vitamins-Minerals (CENTRUM SILVER PO) Take 1 tablet by mouth daily at 12 noon.   Yes [provider]  nitroGLYCERIN (NITROSTAT) 0.4 MG SL tablet Place 0.4 mg under the tongue every 5 (five) minutes as needed. As needed for chest pain.   Yes [provider]  pantoprazole (PROTONIX) 40 MG tablet Take 40 mg by mouth daily.   Yes [provider]  RANEXA 500 MG 12 hr tablet TAKE 1 TABLET TWICE A DAY 05/19/16  Yes Belva Crome, MD  saccharomyces boulardii (FLORASTOR) 250 MG capsule Take 250 mg by mouth daily.    Yes [provider]  timolol (TIMOPTIC-XR) 0.5 % ophthalmic gel-forming Place 1 drop into both eyes daily.   Yes [provider]  travoprost, benzalkonium, (TRAVATAN) 0.004 % ophthalmic solution Place 1 drop into both eyes at bedtime.   Yes [provider]  traZODone (DESYREL) 50 MG tablet Take 150 mg by mouth at bedtime.   Yes [provider]    Scheduled Meds: . iopamidol       Continuous Infusions: . 0.9 % NaCl with KCl 20 mEq / L     PRN Meds:.  Allergies as of 12/11/2016 - Review Complete 12/11/2016  Allergen Reaction Noted  . Adhesive [tape] Other (See Comments)   . Amoxicillin Diarrhea 08/12/2013  . Pregabalin  03/19/2015  . Simvastatin    . Sulfa antibiotics Itching 08/12/2013    Family History  Problem Relation Age of Onset  . Pancreatic cancer Mother   . CAD Mother   . Heart attack Mother   . CAD Father   . Heart attack Father   . CAD Sister   . Hypertension Sister   . CAD Brother   . Heart attack Sister   . Heart attack Brother     Social History   Social History  . Marital status: Widowed    Spouse name: N/A  . Number of children: N/A  . Years of education: N/A   Occupational History  . Not on file.   Social History Main Topics  . Smoking status: Never Smoker  . Smokeless tobacco:  Never Used  . Alcohol use No     Comment: none in 20 years - 08/12/13  . Drug use: No  . Sexual activity: Not on file   Other Topics Concern  . Not on file   Social History Narrative  . No narrative on file    Review of Systems: All negative except as stated above in HPI.  Physical Exam: Vital signs: Vitals:   12/11/16 1230 12/11/16 1432  BP: (!) 175/62 (!) 173/63  Pulse: 88 94  Resp: 19 18  Temp: 99.1 F (37.3 C) 99 F (37.2 C)     General:   Alert and cooperative elderly appearing patient. Currently not in acute distress. HEENT: Normocephalic, atraumatic, extraocular movement  intact. No oral lesions Lungs:  Clear throughout to auscultation.   No wheezes, crackles, or rhonchi. No acute distress. Heart:  Regular rate and rhythm; no murmurs, clicks, rubs,  or gallops. Abdomen: Patient has generalized discomfort with the some right upper quadrant tenderness to palpation, bowel sounds present. No peritoneal signs. LE: No edema. Pulses intact Rectal:  Deferred  GI:  Lab Results:  Recent Labs  12/11/16 1025 12/11/16 1036  WBC 14.7*  --   HGB 14.6 15.6*  HCT 42.7 46.0  PLT 533*  --    BMET  Recent Labs  12/11/16 1025 12/11/16 1036  NA 141 141  K 3.4* 3.3*  CL 104 104  CO2 25  --   GLUCOSE 108* 107*  BUN 10 9  CREATININE 0.95 1.00  CALCIUM 9.8  --    LFT  Recent Labs  12/11/16 1025  PROT 7.7  ALBUMIN 4.3  AST 22  ALT 16  ALKPHOS 58  BILITOT 0.9   PT/INR No results for input(s): LABPROT, INR in the last 72 hours.   Studies/Results: Ct T-spine No Charge  Result Date: 12/11/2016 CLINICAL DATA:  Back pain EXAM: CT THORACIC SPINE WITHOUT CONTRAST TECHNIQUE: Multidetector CT images of the thoracic were obtained using the standard protocol without intravenous contrast. COMPARISON:  03/19/2015 FINDINGS: Alignment: Normal. Vertebrae: No acute fracture or focal pathologic process. Chronic mild compression fractures of the T6, T7, T8, T9 vertebral body.  Paraspinal and other soft tissues: Negative. Disc levels: Mild degenerative disc disease with disc height loss at T2-3, T3-4, T4-5 and T5-6. Anterior bridging osteophyte at T10-11. IMPRESSION: 1.  No acute osseous injury of the thoracic spine. Electronically Signed   By: Kathreen Devoid   On: 12/11/2016 13:08   Dg Chest Portable 1 View  Result Date: 12/11/2016 CLINICAL DATA:  Left-sided chest pain EXAM: PORTABLE CHEST 1 VIEW COMPARISON:  07/11/2016 FINDINGS: The heart size and mediastinal contours are within normal limits. Aortic atherosclerosis. Both lungs are clear. Bilateral glenohumeral joint osteoarthritis. IMPRESSION: 1. No active disease 2.  Aortic Atherosclerosis (ICD10-I70.0). Electronically Signed   By: Kerby Moors M.D.   On: 12/11/2016 10:19   Ct Angio Chest/abd/pel For Dissection W And/or Wo Contrast  Result Date: 12/11/2016 CLINICAL DATA:  Chest and abdominal pain for several hours EXAM: CT ANGIOGRAPHY CHEST, ABDOMEN AND PELVIS TECHNIQUE: Multidetector CT imaging through the chest, abdomen and pelvis was performed using the standard protocol during bolus administration of intravenous contrast. Multiplanar reconstructed images and MIPs were obtained and reviewed to evaluate the vascular anatomy. CONTRAST:  100 mL Isovue 370. COMPARISON:  None. FINDINGS: CTA CHEST FINDINGS Cardiovascular: Thoracic aorta demonstrates atherosclerotic calcification without aneurysmal dilatation or dissection. The pulmonary artery shows no evidence of pulmonary emboli. Mild coronary calcifications are seen. Mediastinum/Nodes: Thoracic inlet demonstrates a small less than 1 cm hypodensity in the left lobe of the thyroid. The esophagus is dilated and fluid filled with some soft tissue material just above the gastroesophageal junction. It would be difficult to exclude the possibility of a food bolus. Clinical correlation is recommended. Barium swallow may be helpful. No significant hilar or mediastinal adenopathy is  noted. Lungs/Pleura: Lungs are well aerated bilaterally without focal infiltrate or sizable effusion. A small patchy area of ground-glass density is again seen in the lateral aspect of the right middle lobe. It is slightly decreased in size measuring approximately 10 mm. Stable 7 mm nodule is noted in the posterior aspect of the left upper lobe best seen on image number  23 of series 11. Musculoskeletal: No acute compression deformity is noted. No rib fractures are seen. Review of the MIP images confirms the above findings. CTA ABDOMEN AND PELVIS FINDINGS VASCULAR Aorta: Atherosclerotic changes of the aorta are noted without focal aneurysmal dilatation or dissection. Celiac: Atherosclerotic change is noted at the origin with mild stenosis. The more distal celiac axis is within normal limits. SMA: Mild atherosclerotic change at the origin with mild narrowing identified. The distal aspect is within normal limits. Renals: Single renal arteries are identified bilaterally with atherosclerotic change. Very mild narrowing is noted. IMA: Patent Iliacs: Show atherosclerotic change without aneurysmal dilatation or focal dissection. No focal narrowing is seen. Veins: No definitive venous anomaly is noted. Review of the MIP images confirms the above findings. NON-VASCULAR Hepatobiliary: Diffuse fatty infiltration of the liver is noted. The gallbladder is unremarkable. Pancreas: Pancreas is somewhat atrophic. Spleen: Spleen is within normal limits. Adrenals/Urinary Tract: The adrenal glands are unremarkable. Mild scarring is noted in the kidneys bilaterally with small cysts identified. No calculi or obstructive changes are seen. Bladder is well distended. Stomach/Bowel: Scattered diverticular change of the colon is noted without evidence of diverticulitis. The appendix is not visualized and may have been surgically removed. No inflammatory changes are seen. As described above dilatation of the esophagus is noted with both fluid  and soft tissue changes likely related to ingested food stuffs. Possibility of obstructive food bolus would deserve consideration and could simulate the patient is discomfort. Lymphatic: No significant lymphadenopathy is noted. Reproductive: Uterus has been surgically removed. Other: No free fluid is seen. Musculoskeletal: Degenerative changes of the lumbar spine are noted. No compression deformity is seen. Review of the MIP images confirms the above findings. IMPRESSION: No evidence of thoracic aortic aneurysm or dissection. No pulmonary emboli are seen. Stable changes in both lungs as described above consistent with benign etiology given their stability. Changes suggestive of distal esophageal narrowing with ingested fluids and food stuffs within the distal esophagus. Possibility of a partially obstructive food bolus would deserve consideration. Chronic changes in the abdomen and pelvis without acute abnormality. Electronically Signed   By: Inez Catalina M.D.   On: 12/11/2016 13:00    Impression/Plan: - Nausea and vomiting with CT scan showing possible partial obstructive food bolus. - Left-sided chest pain with along with abdominal pain. CT  Angio  negative for dissection - History of coronary artery disease and stroke. On Plavix. Last dose yesterday morning.  Recommendations ------------------------- - Patient currently resting without any acute distress. She is feeling better. Need for EGD for further evaluation discussed with the patient as well as patient's son. Complications such as infection, bleeding and perforation discussed. Patient and family wants to wait overnight and consider EGD in the morning if symptoms persist. - Recommend nothing by mouth with ice chips.ok to have clear liquid diet if able to keep down water. - GI will follow.   LOS: 0 days   Otis Brace  MD, FACP 12/11/2016, 3:43 PM  Pager [phone] If no answer or after 5 PM call [phone]

## 2016-12-12 NOTE — Op Note (Signed)
[facility] Patient Name: Ellen Marshall Procedure Date: 12/12/2016 MRN: [id number] Attending MD: Lear Ng , MD Date of Birth: [birth year] CSN: [id number] Age: 83 Admit Type: Inpatient Procedure:                Upper GI endoscopy Indications:              Removal of foreign body in the esophagus,                            Dysphagia, Foreign body in the esophagus Providers:                Lear Ng, MD, Alan Mulder, Technician,                            William Dalton, Technician, Zenon Mayo, RN Referring MD:              Medicines:                Propofol per Anesthesia, Monitored Anesthesia Care,                            Patient intubated prior to the procedure Complications:            No immediate complications. Estimated Blood Loss:     Estimated blood loss: none. Procedure:                Pre-Anesthesia Assessment:                           - Prior to the procedure, a History and Physical                            was performed, and patient medications and                            allergies were reviewed. The patient's tolerance of                            previous anesthesia was also reviewed. The risks                            and benefits of the procedure and the sedation                            options and risks were discussed with the patient.                            All questions were answered, and informed consent                            was obtained. Prior Anticoagulants: The patient has                            taken Plavix (clopidogrel), last dose was 2 days  prior to procedure. ASA Grade Assessment: III - A                            patient with severe systemic disease. After                            reviewing the risks and benefits, the patient was                            deemed in satisfactory condition to undergo the                            procedure.      After obtaining informed consent, the endoscope was                            passed under direct vision. Throughout the                            procedure, the patient's blood pressure, pulse, and                            oxygen saturations were monitored continuously. The                            Endoscope was introduced through the mouth, and                            advanced to the second part of duodenum. The upper                            GI endoscopy was performed with difficulty due to                            presence of food. Successful completion of the                            procedure was aided by performing the maneuvers                            documented (below) in this report. The patient                            tolerated the procedure well. Scope In: Scope Out: Findings:      Food was found in the lower third of the esophagus and at the       gastroesophageal junction. Removal of food was accomplished with       multiple passes of the Roth net and use of the Talon grasper.      One moderate (circumferential scarring or stenosis; an endoscope may       pass) benign-appearing, intrinsic stenosis was found 34 to 38 cm from       the incisors. This measured 4 cm (in length) and was traversed. Dilation       not  attempted due to trauma from food bolus.      Patchy moderate mucosal changes characterized by congestion, erythema       and ulceration were found in the lower third of the esophagus due to       trauma from the food impaction.      Segmental mild inflammation characterized by congestion (edema) and       erythema was found in the gastric antrum.      The cardia and gastric fundus were normal on retroflexion.      The examined duodenum was normal. Impression:               - Food in the lower third of the esophagus and at                            the gastroesophageal junction. Removal was                            successful.                            - Benign-appearing esophageal stenosis.                           - Congested, erythematous, ulcerated mucosa in the                            esophagus.                           - Acute gastritis.                           - Normal examined duodenum. Moderate Sedation:      N/A- Per Anesthesia Care Recommendation:           - Start with a clear liquid diet and advance diet                            as tolerated to a pureed diet.                           - Observe patient's clinical course.                           - Post procedure medication orders were given. Procedure Code(s):        --- Professional ---                           [phone], Esophagogastroduodenoscopy, flexible,                            transoral; with removal of foreign body(s) Diagnosis Code(s):        --- Professional ---                           K27.062B, Food in esophagus causing other injury,  initial encounter                           K22.2, Esophageal obstruction                           T18.108A, Unspecified foreign body in esophagus                            causing other injury, initial encounter                           K22.10, Ulcer of esophagus without bleeding                           K29.00, Acute gastritis without bleeding                           R13.10, Dysphagia, unspecified                           K22.8, Other specified diseases of esophagus CPT copyright 2016 [employer]. All rights reserved. The codes documented in this report are preliminary and upon coder review may  be revised to meet current compliance requirements. Lear Ng, MD 12/12/2016 8:42:08 AM This report has been signed electronically. Number of Addenda: 0

## 2016-12-12 NOTE — Transfer of Care (Signed)
Immediate Anesthesia Transfer of Care Note  Patient: Bayyinah Dukeman  Procedure(s) Performed: Procedure(s): ESOPHAGOGASTRODUODENOSCOPY (EGD) WITH PROPOFOL (N/A)  Patient Location: PACU  Anesthesia Type:General  Level of Consciousness: awake, alert  and patient cooperative  Airway & Oxygen Therapy: Patient Spontanous Breathing and Patient connected to face mask oxygen  Post-op Assessment: Report given to RN and Post -op Vital signs reviewed and stable  Post vital signs: Reviewed and stable  Last Vitals:  Vitals:   12/12/16 0637 12/12/16 0710  BP: (!) 185/60 (!) 154/98  Pulse: 80 78  Resp:  (!) 21  Temp:  36.8 C    Last Pain:  Vitals:   12/12/16 0710  TempSrc: Oral  PainSc:          Complications: No apparent anesthesia complications

## 2016-12-12 NOTE — Brief Op Note (Addendum)
Large food impaction that was cleared revealing a moderate distal esophageal stricture. See procedure note for details. Clear liquid diet and advance as tolerated to PUREED diet. Would avoid all solid foods especially meats. Due to her advanced age and comorbidities would not recommend esophageal dilation. If able to tolerate liquid diet then ok to go home. Needs to be on a PPI PO BID for 1 month and then QD. F/U GI prn.

## 2016-12-12 NOTE — Discharge Summary (Signed)
Physician Discharge Summary  Ellen Marshall KGU:[id number] DOB: 02/02/1926 DOA: 12/11/2016  PCP: Josetta Huddle, MD  Admit date: 12/11/2016 Discharge date: 12/12/2016  Admitted From: Home  Disposition:  Home  Recommendations for Outpatient Follow-up:  1. Follow up with PCP in 1-2 weeks  Home Health: No  Equipment/Devices: None  Discharge Condition: Stable  CODE STATUS: DO NOT RESUSCITATE Diet recommendation: Heart Healthy :   Liquid diet to be advanced to pured diet as tolerated as per gastroenterology recommendations    Brief/Interim Summary: 83 y.o. female with history of reflux, hiatal hernia, legally blind, CAD, stroke in the past on aspirin and Plavix, hypertension, CVA in the past, anxiety presented to the hospital with chief complaints of left-sided chest pain along with nausea vomiting that started yesterday. ER workup suggestive of large food in the esophagus. She was evaluated by gastroenterology and underwent upper GI endoscopy and removal of food. GI has cleared the patient for discharge if she tolerates clear liquid diet.  Discharge Diagnoses:  Principal Problem:   Food impaction of esophagus Active Problems:   GERD (gastroesophageal reflux disease)   CAD (coronary artery disease)   HTN (hypertension)   Chronic diastolic heart failure (HCC)   Carotid stenosis   Stroke ([place])  1. Esophageal food impaction presenting with Chest pain, nausea and vomiting: - Status post upper GI endoscopy and food removal. - Discharged patient home if she tolerates liquid diet as per GI recommendations  2. Probable benign esophageal stenosis: No dilation done because of advanced age and comorbidities. Pured diet at home   3. Acute  Gastritis: Protonix 40 mg by mouth twice a day for 4 weeks then once a day as per GI recommendations  4. CAD. Patient has advanced CAD is on aspirin, Plavix, Ranexa, Imdur and beta blocker for secondary prevention. Resume home medications  5. Hypertension.  Resume home medications   4. Chronic CHF EF 60%. Currently compensated. Outpatient follow-up  5. Hypokalemia. Improved   Discharge Instructions  Discharge Instructions    Call MD for:  difficulty breathing, headache or visual disturbances    Complete by:  As directed    Call MD for:  hives    Complete by:  As directed    Call MD for:  persistant dizziness or light-headedness    Complete by:  As directed    Call MD for:  persistant nausea and vomiting    Complete by:  As directed    Call MD for:  severe uncontrolled pain    Complete by:  As directed    Call MD for:  temperature >100.4    Complete by:  As directed    Diet - low sodium heart healthy    Complete by:  As directed    Discharge instructions    Complete by:  As directed    Liquid diet to be advanced to pureed diet as tolerated  (as per GI recommendations)   Increase activity slowly    Complete by:  As directed      Allergies as of 12/12/2016      Reactions   Adhesive [tape] Other (See Comments)   Burning, Itching.   Amoxicillin Diarrhea   Pregabalin    Weakness, diarrhea    Simvastatin    Myalgias   Sulfa Antibiotics Itching      Medication List    TAKE these medications   acetaminophen 650 MG CR tablet Commonly known as:  TYLENOL Take 1,300 mg by mouth 3 (three) times daily as needed  for pain.   ALPRAZolam 0.25 MG tablet Commonly known as:  XANAX Take 0.25 mg by mouth 2 (two) times daily. As needed for anxiety. Takes at 2pm and bedtime   amLODipine 5 MG tablet Commonly known as:  NORVASC Take 1 tablet (5 mg total) by mouth daily.   aspirin EC 81 MG tablet Take 1 tablet (81 mg total) by mouth daily.   Calcium Carbonate-Vitamin D 600-200 MG-UNIT Tabs Take 1 tablet by mouth daily.   CENTRUM SILVER PO Take 1 tablet by mouth daily at 12 noon.   clopidogrel 75 MG tablet Commonly known as:  PLAVIX Take 75 mg by mouth daily after lunch.   diphenoxylate-atropine 2.5-0.025 MG tablet Commonly  known as:  LOMOTIL Take 2 tablets by mouth 4 (four) times daily as needed for diarrhea or loose stools.   escitalopram 20 MG tablet Commonly known as:  LEXAPRO Take 20 mg by mouth every morning.   ferrous sulfate 325 (65 FE) MG tablet Take 325 mg by mouth 2 (two) times daily with a meal.   fexofenadine 180 MG tablet Commonly known as:  ALLEGRA Take 180 mg by mouth daily.   folic acid 102 MCG tablet Commonly known as:  FOLVITE Take 800 mcg by mouth daily.   furosemide 20 MG tablet Commonly known as:  LASIX Take 1 tablet (20 mg total) by mouth every Monday, Wednesday, and Friday.   isosorbide mononitrate 120 MG 24 hr tablet Commonly known as:  IMDUR TAKE 1 TABLET DAILY   losartan 100 MG tablet Commonly known as:  COZAAR take one tablet (100 mg ) daily   Lutein 20 MG Tabs Take 1 tablet by mouth every morning.   metoprolol tartrate 50 MG tablet Commonly known as:  LOPRESSOR Take 25 mg by mouth 2 (two) times daily.   nitroGLYCERIN 0.4 MG SL tablet Commonly known as:  NITROSTAT Place 0.4 mg under the tongue every 5 (five) minutes as needed. As needed for chest pain.   pantoprazole 40 MG tablet Commonly known as:  PROTONIX Take 1 tablet (40 mg total) by mouth 2 (two) times daily. What changed:  when to take this   RANEXA 500 MG 12 hr tablet Generic drug:  ranolazine TAKE 1 TABLET TWICE A DAY   saccharomyces boulardii 250 MG capsule Commonly known as:  FLORASTOR Take 250 mg by mouth daily.   timolol 0.5 % ophthalmic gel-forming Commonly known as:  TIMOPTIC-XR Place 1 drop into both eyes daily.   travoprost (benzalkonium) 0.004 % ophthalmic solution Commonly known as:  TRAVATAN Place 1 drop into both eyes at bedtime.   traZODone 50 MG tablet Commonly known as:  DESYREL Take 150 mg by mouth at bedtime.      Follow-up Information    Josetta Huddle, MD Follow up in 1 week(s).   Specialty:  Internal Medicine Contact information: [address] [place] [address] [phone]          Allergies  Allergen Reactions  . Adhesive [Tape] Other (See Comments)    Burning, Itching.  . Amoxicillin Diarrhea  . Pregabalin     Weakness, diarrhea   . Simvastatin     Myalgias   . Sulfa Antibiotics Itching    Consultations:  GI   Procedures/Studies: Ct T-spine No Charge  Result Date: 12/11/2016 CLINICAL DATA:  Back pain EXAM: CT THORACIC SPINE WITHOUT CONTRAST TECHNIQUE: Multidetector CT images of the thoracic were obtained using the standard protocol without intravenous contrast. COMPARISON:  03/19/2015  FINDINGS: Alignment: Normal. Vertebrae: No acute fracture or focal pathologic process. Chronic mild compression fractures of the T6, T7, T8, T9 vertebral body. Paraspinal and other soft tissues: Negative. Disc levels: Mild degenerative disc disease with disc height loss at T2-3, T3-4, T4-5 and T5-6. Anterior bridging osteophyte at T10-11. IMPRESSION: 1.  No acute osseous injury of the thoracic spine. Electronically Signed   By: Kathreen Devoid   On: 12/11/2016 13:08   Dg Chest Portable 1 View  Result Date: 12/11/2016 CLINICAL DATA:  Left-sided chest pain EXAM: PORTABLE CHEST 1 VIEW COMPARISON:  07/11/2016 FINDINGS: The heart size and mediastinal contours are within normal limits. Aortic atherosclerosis. Both lungs are clear. Bilateral glenohumeral joint osteoarthritis. IMPRESSION: 1. No active disease 2.  Aortic Atherosclerosis (ICD10-I70.0). Electronically Signed   By: Kerby Moors M.D.   On: 12/11/2016 10:19   Ct Angio Chest/abd/pel For Dissection W And/or Wo Contrast  Result Date: 12/11/2016 CLINICAL DATA:  Chest and abdominal pain for several hours EXAM: CT ANGIOGRAPHY CHEST, ABDOMEN AND PELVIS TECHNIQUE: Multidetector CT imaging through the chest, abdomen and pelvis was performed using the standard protocol during bolus administration of intravenous contrast. Multiplanar reconstructed images and MIPs were obtained and  reviewed to evaluate the vascular anatomy. CONTRAST:  100 mL Isovue 370. COMPARISON:  None. FINDINGS: CTA CHEST FINDINGS Cardiovascular: Thoracic aorta demonstrates atherosclerotic calcification without aneurysmal dilatation or dissection. The pulmonary artery shows no evidence of pulmonary emboli. Mild coronary calcifications are seen. Mediastinum/Nodes: Thoracic inlet demonstrates a small less than 1 cm hypodensity in the left lobe of the thyroid. The esophagus is dilated and fluid filled with some soft tissue material just above the gastroesophageal junction. It would be difficult to exclude the possibility of a food bolus. Clinical correlation is recommended. Barium swallow may be helpful. No significant hilar or mediastinal adenopathy is noted. Lungs/Pleura: Lungs are well aerated bilaterally without focal infiltrate or sizable effusion. A small patchy area of ground-glass density is again seen in the lateral aspect of the right middle lobe. It is slightly decreased in size measuring approximately 10 mm. Stable 7 mm nodule is noted in the posterior aspect of the left upper lobe best seen on image number 23 of series 11. Musculoskeletal: No acute compression deformity is noted. No rib fractures are seen. Review of the MIP images confirms the above findings. CTA ABDOMEN AND PELVIS FINDINGS VASCULAR Aorta: Atherosclerotic changes of the aorta are noted without focal aneurysmal dilatation or dissection. Celiac: Atherosclerotic change is noted at the origin with mild stenosis. The more distal celiac axis is within normal limits. SMA: Mild atherosclerotic change at the origin with mild narrowing identified. The distal aspect is within normal limits. Renals: Single renal arteries are identified bilaterally with atherosclerotic change. Very mild narrowing is noted. IMA: Patent Iliacs: Show atherosclerotic change without aneurysmal dilatation or focal dissection. No focal narrowing is seen. Veins: No definitive venous  anomaly is noted. Review of the MIP images confirms the above findings. NON-VASCULAR Hepatobiliary: Diffuse fatty infiltration of the liver is noted. The gallbladder is unremarkable. Pancreas: Pancreas is somewhat atrophic. Spleen: Spleen is within normal limits. Adrenals/Urinary Tract: The adrenal glands are unremarkable. Mild scarring is noted in the kidneys bilaterally with small cysts identified. No calculi or obstructive changes are seen. Bladder is well distended. Stomach/Bowel: Scattered diverticular change of the colon is noted without evidence of diverticulitis. The appendix is not visualized and may have been surgically removed. No inflammatory changes are seen. As described above dilatation of the esophagus is  noted with both fluid and soft tissue changes likely related to ingested food stuffs. Possibility of obstructive food bolus would deserve consideration and could simulate the patient is discomfort. Lymphatic: No significant lymphadenopathy is noted. Reproductive: Uterus has been surgically removed. Other: No free fluid is seen. Musculoskeletal: Degenerative changes of the lumbar spine are noted. No compression deformity is seen. Review of the MIP images confirms the above findings. IMPRESSION: No evidence of thoracic aortic aneurysm or dissection. No pulmonary emboli are seen. Stable changes in both lungs as described above consistent with benign etiology given their stability. Changes suggestive of distal esophageal narrowing with ingested fluids and food stuffs within the distal esophagus. Possibility of a partially obstructive food bolus would deserve consideration. Chronic changes in the abdomen and pelvis without acute abnormality. Electronically Signed   By: Inez Catalina M.D.   On: 12/11/2016 13:00    EGD on 12/12/2016: Impression:               - Food in the lower third of the esophagus and at                            the gastroesophageal junction. Removal was                             successful.                           - Benign-appearing esophageal stenosis.                           - Congested, erythematous, ulcerated mucosa in the                            esophagus.                           - Acute gastritis.                           - Normal examined duodenum.    Subjective: Patient seen and examined at bedside. She denies any current fever, nausea, vomiting. She is tolerating clear liquid diet.  Discharge Exam: Vitals:   12/12/16 0943 12/12/16 1046  BP: (!) 177/47 (!) 186/56  Pulse: 86 82  Resp: 18 20  Temp: 98.1 F (36.7 C)    Vitals:   12/12/16 0902 12/12/16 0929 12/12/16 0943 12/12/16 1046  BP: (!) 153/43 (!) 172/45 (!) 177/47 (!) 186/56  Pulse: 86 85 86 82  Resp: (!) 25 18 18 20   Temp:  98.4 F (36.9 C) 98.1 F (36.7 C)   TempSrc:  Oral Oral   SpO2: 94% 95% 96%   Weight:      Height:        General: Pt is alert, awake, not in acute distress Cardiovascular: Rate controlled, S1/S2 + Respiratory: Bilateral decreased breath sounds at bases Abdominal: Soft, NT, ND, bowel sounds + Extremities: no edema, no cyanosis    The results of significant diagnostics from this hospitalization (including imaging, microbiology, ancillary and laboratory) are listed below for reference.     Microbiology: No results found for this or any previous visit (from the past 240 hour(s)).   Labs: BNP (last  3 results)  Recent Labs  02/28/16 1413  BNP 161.0*   Basic Metabolic Panel:  Recent Labs Lab 12/11/16 1025 12/11/16 1036 12/12/16 0426  NA 141 141 143  K 3.4* 3.3* 3.9  CL 104 104 112*  CO2 25  --  24  GLUCOSE 108* 107* 94  BUN 10 9 9   CREATININE 0.95 1.00 0.83  CALCIUM 9.8  --  8.6*   Liver Function Tests:  Recent Labs Lab 12/11/16 1025  AST 22  ALT 16  ALKPHOS 58  BILITOT 0.9  PROT 7.7  ALBUMIN 4.3    Recent Labs Lab 12/11/16 1025  LIPASE 25   No results for input(s): AMMONIA in the last 168 hours. CBC:  Recent  Labs Lab 12/11/16 1025 12/11/16 1036 12/12/16 0426  WBC 14.7*  --  15.0*  NEUTROABS 11.9*  --   --   HGB 14.6 15.6* 12.3  HCT 42.7 46.0 37.6  MCV 96.6  --  98.7  PLT 533*  --  444*   Cardiac Enzymes:  Recent Labs Lab 12/11/16 1830 12/11/16 2200 12/12/16 0426  TROPONINI <0.03 <0.03 <0.03   BNP: Invalid input(s): POCBNP CBG: No results for input(s): GLUCAP in the last 168 hours. D-Dimer No results for input(s): DDIMER in the last 72 hours. Hgb A1c No results for input(s): HGBA1C in the last 72 hours. Lipid Profile No results for input(s): CHOL, HDL, LDLCALC, TRIG, CHOLHDL, LDLDIRECT in the last 72 hours. Thyroid function studies No results for input(s): TSH, T4TOTAL, T3FREE, THYROIDAB in the last 72 hours.  Invalid input(s): FREET3 Anemia work up No results for input(s): VITAMINB12, FOLATE, FERRITIN, TIBC, IRON, RETICCTPCT in the last 72 hours. Urinalysis    Component Value Date/Time   COLORURINE YELLOW 12/11/2016 1031   APPEARANCEUR CLEAR 12/11/2016 1031   LABSPEC 1.013 12/11/2016 1031   PHURINE 6.0 12/11/2016 1031   GLUCOSEU NEGATIVE 12/11/2016 1031   GLUCOSEU NEGATIVE 10/13/2013 1518   HGBUR NEGATIVE 12/11/2016 1031   BILIRUBINUR NEGATIVE 12/11/2016 1031   KETONESUR 20 (A) 12/11/2016 1031   PROTEINUR 100 (A) 12/11/2016 1031   UROBILINOGEN 0.2 07/30/2014 0730   NITRITE NEGATIVE 12/11/2016 1031   LEUKOCYTESUR TRACE (A) 12/11/2016 1031   Sepsis Labs Invalid input(s): PROCALCITONIN,  WBC,  LACTICIDVEN Microbiology No results found for this or any previous visit (from the past 240 hour(s)).   Time coordinating discharge: 30 minutes  SIGNED:   Aline August, MD  [facility] 12/12/2016, 11:31 AM Pager: [phone]  If 7PM-7AM, please contact night-coverage [email or web address] Password [email or web address]

## 2016-12-12 NOTE — Progress Notes (Signed)
Rechecked BP.  Result 185/760 HR 70.  Reported to Endo nurse that she needs hydralazine because BP did not decrease significantly.  She verbalized they can give her some downstairs.  Marland Kitchen

## 2016-12-12 NOTE — Anesthesia Procedure Notes (Signed)
Procedure Name: Intubation Date/Time: 12/12/2016 8:01 AM Performed by: Dione Booze Pre-anesthesia Checklist: Emergency Drugs available, Suction available, Patient being monitored and Patient identified Patient Re-evaluated:Patient Re-evaluated prior to inductionOxygen Delivery Method: Circle system utilized Preoxygenation: Pre-oxygenation with 100% oxygen Intubation Type: Cricoid Pressure applied and Rapid sequence Laryngoscope Size: Mac and 4 Tube size: 7.5 mm Number of attempts: 1 Placement Confirmation: ETT inserted through vocal cords under direct vision,  breath sounds checked- equal and bilateral and positive ETCO2 Secured at: 21 cm Tube secured with: Tape Dental Injury: Teeth and Oropharynx as per pre-operative assessment

## 2016-12-12 NOTE — Progress Notes (Signed)
Ellen Marshall, esophogus burden.  BP is 190/51 HR 85. Patient is refusing Nitro paste bec felt apprehensive& fear first time she took it.  Notified MD.  Will continue to administer antihypertensive as ordered.

## 2016-12-12 NOTE — Anesthesia Postprocedure Evaluation (Addendum)
Anesthesia Post Note  Patient: Ellen Marshall  Procedure(s) Performed: Procedure(s) (LRB): ESOPHAGOGASTRODUODENOSCOPY (EGD) WITH PROPOFOL (N/A)  Patient location during evaluation: PACU Anesthesia Type: General Level of consciousness: awake and alert Pain management: pain level controlled Vital Signs Assessment: post-procedure vital signs reviewed and stable Respiratory status: spontaneous breathing, nonlabored ventilation, respiratory function stable and patient connected to nasal cannula oxygen Cardiovascular status: stable and blood pressure returned to baseline Anesthetic complications: no       Last Vitals:  Vitals:   12/12/16 0844 12/12/16 0854  BP: (!) 177/62 (!) 166/35  Pulse: 88 86  Resp: (!) 27 (!) 25  Temp: 37.1 C 37.1 C    Last Pain:  Vitals:   12/12/16 0710  TempSrc: Oral  PainSc:                  Suan Pyeatt S

## 2016-12-14 ENCOUNTER — Encounter (HOSPITAL_COMMUNITY): Payer: Self-pay | Admitting: Gastroenterology

## 2016-12-18 NOTE — Addendum Note (Signed)
Addendum  created 12/18/16 1459 by Satonya Lux, MD   Sign clinical note    

## 2016-12-26 DIAGNOSIS — M7581 Other shoulder lesions, right shoulder: Secondary | ICD-10-CM | POA: Diagnosis not present

## 2016-12-26 DIAGNOSIS — T18128A Food in esophagus causing other injury, initial encounter: Secondary | ICD-10-CM | POA: Diagnosis not present

## 2016-12-26 DIAGNOSIS — K219 Gastro-esophageal reflux disease without esophagitis: Secondary | ICD-10-CM | POA: Diagnosis not present

## 2017-02-27 ENCOUNTER — Other Ambulatory Visit: Payer: Self-pay | Admitting: Gastroenterology

## 2017-02-27 DIAGNOSIS — Z8679 Personal history of other diseases of the circulatory system: Secondary | ICD-10-CM | POA: Diagnosis not present

## 2017-02-27 DIAGNOSIS — K222 Esophageal obstruction: Secondary | ICD-10-CM | POA: Diagnosis not present

## 2017-02-27 DIAGNOSIS — R1310 Dysphagia, unspecified: Secondary | ICD-10-CM | POA: Diagnosis not present

## 2017-03-08 NOTE — Progress Notes (Signed)
Cardiology Office Note   Date:  03/09/2017   ID:  Ellen Marshall, DOB [birth year], MRN [id number]  PCP:  Josetta Huddle, MD  Cardiologist:  Dr. Tamala Julian     Chief Complaint  Patient presents with  . Pre-op Exam    for EGD with dilatation of espophageal stricture.        History of Present Illness: Ellen Marshall is a 83 y.o. female who presents for pre-surgical eval.  for esophagogastroduodenoscopy.  Recent hospitalization for food impaction of esophagus.  She presented with vomiting and EGD was done with clearance of food.  Also with acute gastritis and protonix added.   She is now eating baby food and pureed food.  Dr. Watt Climes discussed with pt and son and would like to do dilatation if this is not done she will need PEG for nutrition.    She has a hx of coronary artery disease with high-grade obstruction in the diagonal branch. There has been recent difficulty with increased blood pressure. Recent changes have been made by Truitt Merle. No side effects with her recent changes.  Chronic HF, EF 60%,  Last stress test 02/2014 neg for ischemia.  Recent CTA of chast with mild coronary calcifications seen.  No PE.     Today pt has her chronic angina that is treated with SL NTG.  No change in this.  She is weak with 11 pound wt loss in short period of time.  She is weak with exertion and DOE.  Again this most likely due to the wt loss and dietary issues.    Past Medical History:  Diagnosis Date  . Chest pain   . Coronary artery disease   . GERD (gastroesophageal reflux disease)   . Hypertension   . Shortness of breath    laying down  . Stroke [facility])     Past Surgical History:  Procedure Laterality Date  . ABDOMINAL HYSTERECTOMY    . colonscopy    . ESOPHAGOGASTRODUODENOSCOPY    . ESOPHAGOGASTRODUODENOSCOPY  10/10/2011   Procedure: ESOPHAGOGASTRODUODENOSCOPY (EGD);  Surgeon: Winfield Cunas., MD;  Location: [facility] ENDOSCOPY;  Service: Endoscopy;  Laterality: N/A;  with control of bleeding  .  ESOPHAGOGASTRODUODENOSCOPY  10/12/2011   Procedure: ESOPHAGOGASTRODUODENOSCOPY (EGD);  Surgeon: Winfield Cunas., MD;  Location: [facility] ENDOSCOPY;  Service: Endoscopy;  Laterality: N/A;  . ESOPHAGOGASTRODUODENOSCOPY (EGD) WITH PROPOFOL N/A 12/12/2016   Procedure: ESOPHAGOGASTRODUODENOSCOPY (EGD) WITH PROPOFOL;  Surgeon: Wilford Corner, MD;  Location: [facility] ENDOSCOPY;  Service: Endoscopy;  Laterality: N/A;  . FLEXIBLE SIGMOIDOSCOPY  10/12/2011   Procedure: FLEXIBLE SIGMOIDOSCOPY;  Surgeon: Winfield Cunas., MD;  Location: [facility] ENDOSCOPY;  Service: Endoscopy;  Laterality: N/A;  . stretching of EGD       Current Outpatient Prescriptions  Medication Sig Dispense Refill  . acetaminophen (TYLENOL) 650 MG CR tablet Take 1,300 mg by mouth 3 (three) times daily as needed for pain.    Marland Kitchen ALPRAZolam (XANAX) 0.25 MG tablet Take 0.25 mg by mouth 2 (two) times daily. As needed for anxiety. Takes at 2pm and bedtime    . amLODipine (NORVASC) 5 MG tablet Take 1 tablet (5 mg total) by mouth daily. 90 tablet 3  . aspirin EC 81 MG tablet Take 1 tablet (81 mg total) by mouth daily.    . Calcium Carbonate-Vitamin D (CALCIUM + D) 600-200 MG-UNIT TABS Take 1 tablet by mouth daily.     . clopidogrel (PLAVIX) 75 MG tablet Take 75 mg by mouth daily after lunch.     Marland Kitchen  diphenoxylate-atropine (LOMOTIL) 2.5-0.025 MG per tablet Take 2 tablets by mouth 4 (four) times daily as needed for diarrhea or loose stools.     Marland Kitchen escitalopram (LEXAPRO) 20 MG tablet Take 20 mg by mouth every morning.    . ferrous sulfate 325 (65 FE) MG tablet Take 325 mg by mouth 2 (two) times daily with a meal.    . fexofenadine (ALLEGRA) 180 MG tablet Take 180 mg by mouth daily.    . folic acid (FOLVITE) 387 MCG tablet Take 800 mcg by mouth daily.    . furosemide (LASIX) 20 MG tablet Take 1 tablet (20 mg total) by mouth every Monday, Wednesday, and Friday. 15 tablet 11  . isosorbide mononitrate (IMDUR) 120 MG 24 hr tablet TAKE 1 TABLET DAILY 90 tablet 3    . losartan (COZAAR) 100 MG tablet take one tablet (100 mg ) daily  1  . Lutein 20 MG TABS Take 1 tablet by mouth every morning.    . metoprolol (LOPRESSOR) 50 MG tablet Take 25 mg by mouth 2 (two) times daily.    . Multiple Vitamins-Minerals (CENTRUM SILVER PO) Take 1 tablet by mouth daily at 12 noon.    . nitroGLYCERIN (NITROSTAT) 0.4 MG SL tablet Place 0.4 mg under the tongue every 5 (five) minutes as needed. As needed for chest pain.    . pantoprazole (PROTONIX) 40 MG tablet Take 1 tablet (40 mg total) by mouth 2 (two) times daily. 60 tablet 0  . RANEXA 500 MG 12 hr tablet TAKE 1 TABLET TWICE A DAY 180 tablet 3  . saccharomyces boulardii (FLORASTOR) 250 MG capsule Take 250 mg by mouth daily.     . timolol (TIMOPTIC-XR) 0.5 % ophthalmic gel-forming Place 1 drop into both eyes daily.    . travoprost, benzalkonium, (TRAVATAN) 0.004 % ophthalmic solution Place 1 drop into both eyes at bedtime.    . traZODone (DESYREL) 50 MG tablet Take 150 mg by mouth at bedtime.     No current facility-administered medications for this visit.     Allergies:   Adhesive [tape]; Amoxicillin; Pregabalin; Simvastatin; and Sulfa antibiotics    Social History:  The patient  reports that she has never smoked. She has never used smokeless tobacco. She reports that she does not drink alcohol or use drugs.   Family History:  The patient's family history includes CAD in her brother, father, mother, and sister; Heart attack in her brother, father, mother, and sister; Hypertension in her sister; Pancreatic cancer in her mother.    ROS:  General:no colds or fevers, + weight loss Skin:no rashes or ulcers HEENT:no blurred vision, no congestion CV:see HPI PUL:see HPI GI:no diarrhea constipation or melena, no indigestion GU:no hematuria, no dysuria MS:no joint pain, no claudication Neuro:no syncope, no lightheadedness Endo:no diabetes, no thyroid disease  Wt Readings from Last 3 Encounters:  03/09/17 129 lb (58.5  kg)  12/12/16 131 lb (59.4 kg)  02/28/16 141 lb 12.8 oz (64.3 kg)     PHYSICAL EXAM: VS:  BP (!) 132/40   Pulse 63   Ht 5\' 3"  (1.6 m)   Wt 129 lb (58.5 kg)   SpO2 98%   BMI 22.85 kg/m  , BMI Body mass index is 22.85 kg/m. General:Pleasant affect, NAD Skin:Warm and dry, brisk capillary refill HEENT:normocephalic, sclera clear, mucus membranes moist Neck:supple, no JVD, no bruits  Heart:S1S2 RRR without murmur, gallup, rub or click Lungs:clear without rales, rhonchi, or wheezes FIE:PPIR, mild tenderness, + BS, do not palpate liver  spleen or masses Ext:no lower ext edema, 2+ pedal pulses, 2+ radial pulses Neuro:alert and oriented X 3, MAE, follows commands, + facial symmetry    EKG:  EKG is ordered today. The ekg ordered today demonstrates SR inf. MI but no changes since previous EKG.     Recent Labs: 12/11/2016: ALT 16 12/12/2016: BUN 9; Creatinine, Ser 0.83; Hemoglobin 12.3; Platelets 444; Potassium 3.9; Sodium 143    Lipid Panel No results found for: CHOL, TRIG, HDL, CHOLHDL, VLDL, LDLCALC, LDLDIRECT     Other studies Reviewed: Additional studies/ records that were reviewed today include: .  Echo 07/2013 Study Conclusions  - Left ventricle: The cavity size was normal. Wall thickness was normal. Systolic function was normal. The estimated ejection fraction was in the range of 60% to 65%. Wall motion was normal; there were no regional wall motion abnormalities. Doppler parameters are consistent with abnormal left ventricular relaxation (grade 1 diastolic dysfunction). - Mitral valve: Calcified annulus. Impressions:  - Compared to the prior study, there has been no significant interval change.  Nuc stress test 02/2014 neg for ischemia.  ASSESSMENT AND PLAN:  1.  Pr-op exam discussed with Dr. Irish Lack the DOD and low risk procedure and she is at moderate cardiac risk, her age also puts her at risk. I explained to pt and family.  She will follow up  with Dr. Tamala Julian in 2-3 months.   2. HTN controlled  3.  Chronic diastolic HF stable  4.  Hx CAD - last nuc in 2015 without ischemia, no change in chest pain, chronic.     Current medicines are reviewed with the patient today.  The patient Has no concerns regarding medicines.  The following changes have been made:  See above Labs/ tests ordered today include:see above  Disposition:   FU:  see above  Signed, Cecilie Kicks, NP  03/09/2017 11:41 AM    [address], [place], [place], [place] Phone: [phone]; Fax: [phone]

## 2017-03-09 ENCOUNTER — Ambulatory Visit (INDEPENDENT_AMBULATORY_CARE_PROVIDER_SITE_OTHER): Payer: Medicare Other | Admitting: Cardiology

## 2017-03-09 ENCOUNTER — Telehealth: Payer: Self-pay | Admitting: Cardiology

## 2017-03-09 ENCOUNTER — Encounter: Payer: Self-pay | Admitting: Cardiology

## 2017-03-09 VITALS — BP 132/40 | HR 63 | Ht 63.0 in | Wt 129.0 lb

## 2017-03-09 DIAGNOSIS — Z01818 Encounter for other preprocedural examination: Secondary | ICD-10-CM

## 2017-03-09 DIAGNOSIS — I5032 Chronic diastolic (congestive) heart failure: Secondary | ICD-10-CM

## 2017-03-09 DIAGNOSIS — I10 Essential (primary) hypertension: Secondary | ICD-10-CM

## 2017-03-09 DIAGNOSIS — I2510 Atherosclerotic heart disease of native coronary artery without angina pectoris: Secondary | ICD-10-CM

## 2017-03-09 NOTE — Telephone Encounter (Signed)
Ellen Marshall calling to let you know that she faxed the clearance form to office

## 2017-03-09 NOTE — Patient Instructions (Signed)
Your physician recommends that you continue on your current medications as directed. Please refer to the Current Medication list given to you today.   Your physician recommends that you schedule a follow-up appointment in:  2-3 MONTHS WITH DR Tamala Julian

## 2017-03-18 ENCOUNTER — Other Ambulatory Visit: Payer: Self-pay | Admitting: Interventional Cardiology

## 2017-03-20 ENCOUNTER — Telehealth: Payer: Self-pay | Admitting: Interventional Cardiology

## 2017-03-20 NOTE — Telephone Encounter (Signed)
Okay to hold plavix for 5 days before surgery/endo.

## 2017-03-20 NOTE — Telephone Encounter (Signed)
New message    [place] is calling from [facility].      [facility] Pre-operative Risk Assessment    Request for surgical clearance:  1. What type of surgery is being performed? Upper endoscopy with upper dilation   2. When is this surgery scheduled? 04/13/17  3. Are there any medications that need to be held prior to surgery and how long? Hold Plavix 5 days prior  4. Name of physician performing surgery? Mark Magod   5. What is your office phone and fax number? [place] 03/20/2017, 11:59 AM  _________________________________________________________________   (provider comments below)

## 2017-03-21 NOTE — Telephone Encounter (Signed)
Faxed to requesting office. 

## 2017-04-02 DIAGNOSIS — I2510 Atherosclerotic heart disease of native coronary artery without angina pectoris: Secondary | ICD-10-CM | POA: Diagnosis not present

## 2017-04-02 DIAGNOSIS — I5032 Chronic diastolic (congestive) heart failure: Secondary | ICD-10-CM | POA: Diagnosis not present

## 2017-04-02 DIAGNOSIS — I10 Essential (primary) hypertension: Secondary | ICD-10-CM | POA: Diagnosis not present

## 2017-04-02 DIAGNOSIS — I679 Cerebrovascular disease, unspecified: Secondary | ICD-10-CM | POA: Diagnosis not present

## 2017-04-02 DIAGNOSIS — Z23 Encounter for immunization: Secondary | ICD-10-CM | POA: Diagnosis not present

## 2017-04-02 DIAGNOSIS — J309 Allergic rhinitis, unspecified: Secondary | ICD-10-CM | POA: Diagnosis not present

## 2017-04-02 DIAGNOSIS — G4700 Insomnia, unspecified: Secondary | ICD-10-CM | POA: Diagnosis not present

## 2017-04-02 DIAGNOSIS — Z1389 Encounter for screening for other disorder: Secondary | ICD-10-CM | POA: Diagnosis not present

## 2017-04-02 DIAGNOSIS — G629 Polyneuropathy, unspecified: Secondary | ICD-10-CM | POA: Diagnosis not present

## 2017-04-02 DIAGNOSIS — Z0000 Encounter for general adult medical examination without abnormal findings: Secondary | ICD-10-CM | POA: Diagnosis not present

## 2017-04-02 DIAGNOSIS — K219 Gastro-esophageal reflux disease without esophagitis: Secondary | ICD-10-CM | POA: Diagnosis not present

## 2017-04-02 DIAGNOSIS — Z79899 Other long term (current) drug therapy: Secondary | ICD-10-CM | POA: Diagnosis not present

## 2017-04-02 DIAGNOSIS — M7581 Other shoulder lesions, right shoulder: Secondary | ICD-10-CM | POA: Diagnosis not present

## 2017-04-02 DIAGNOSIS — K222 Esophageal obstruction: Secondary | ICD-10-CM | POA: Diagnosis not present

## 2017-04-13 ENCOUNTER — Encounter (HOSPITAL_COMMUNITY): Admission: RE | Payer: Self-pay | Source: Ambulatory Visit

## 2017-04-13 ENCOUNTER — Ambulatory Visit (HOSPITAL_COMMUNITY): Admission: RE | Admit: 2017-04-13 | Payer: Medicare Other | Source: Ambulatory Visit | Admitting: Gastroenterology

## 2017-04-13 SURGERY — ESOPHAGOGASTRODUODENOSCOPY (EGD) WITH PROPOFOL
Anesthesia: Monitor Anesthesia Care

## 2017-05-09 ENCOUNTER — Other Ambulatory Visit: Payer: Self-pay | Admitting: Gastroenterology

## 2017-05-09 DIAGNOSIS — R1310 Dysphagia, unspecified: Secondary | ICD-10-CM

## 2017-05-09 DIAGNOSIS — K222 Esophageal obstruction: Secondary | ICD-10-CM | POA: Diagnosis not present

## 2017-05-09 DIAGNOSIS — R1319 Other dysphagia: Secondary | ICD-10-CM

## 2017-05-10 ENCOUNTER — Ambulatory Visit
Admission: RE | Admit: 2017-05-10 | Discharge: 2017-05-10 | Disposition: A | Discharge status: Home / Self Care | Payer: Medicare Other | Source: Ambulatory Visit | Attending: Gastroenterology | Admitting: Gastroenterology

## 2017-05-10 ENCOUNTER — Other Ambulatory Visit: Payer: Self-pay | Admitting: Interventional Cardiology

## 2017-05-10 DIAGNOSIS — K225 Diverticulum of esophagus, acquired: Secondary | ICD-10-CM | POA: Diagnosis not present

## 2017-05-10 DIAGNOSIS — R1310 Dysphagia, unspecified: Secondary | ICD-10-CM

## 2017-05-10 DIAGNOSIS — R1319 Other dysphagia: Secondary | ICD-10-CM

## 2017-05-14 DIAGNOSIS — H401134 Primary open-angle glaucoma, bilateral, indeterminate stage: Secondary | ICD-10-CM | POA: Diagnosis not present

## 2017-05-14 DIAGNOSIS — H353134 Nonexudative age-related macular degeneration, bilateral, advanced atrophic with subfoveal involvement: Secondary | ICD-10-CM | POA: Diagnosis not present

## 2017-05-14 DIAGNOSIS — H04123 Dry eye syndrome of bilateral lacrimal glands: Secondary | ICD-10-CM | POA: Diagnosis not present

## 2017-05-15 ENCOUNTER — Ambulatory Visit (INDEPENDENT_AMBULATORY_CARE_PROVIDER_SITE_OTHER): Payer: Medicare Other | Admitting: Interventional Cardiology

## 2017-05-15 ENCOUNTER — Encounter: Payer: Self-pay | Admitting: Interventional Cardiology

## 2017-05-15 VITALS — BP 146/56 | HR 65 | Ht 63.0 in | Wt 130.8 lb

## 2017-05-15 DIAGNOSIS — I5032 Chronic diastolic (congestive) heart failure: Secondary | ICD-10-CM | POA: Diagnosis not present

## 2017-05-15 DIAGNOSIS — I10 Essential (primary) hypertension: Secondary | ICD-10-CM | POA: Diagnosis not present

## 2017-05-15 DIAGNOSIS — I2510 Atherosclerotic heart disease of native coronary artery without angina pectoris: Secondary | ICD-10-CM | POA: Diagnosis not present

## 2017-05-15 NOTE — Progress Notes (Signed)
Cardiology Office Note    Date:  05/15/2017   ID:  Ellen Marshall, DOB 26-Dec-1925, MRN [id number]  PCP:  Josetta Huddle, MD  Cardiologist: Sinclair Grooms, MD   Chief Complaint  Patient presents with  . Congestive Heart Failure    History of Present Illness:  Ellen Marshall is a 83 y.o. female who presents for chronic angina pectoris, normal myocardial perfusion study, chronic diastolic heart failure, essential hypertension, history of CVA, prior history of GI bleeding, and recent hospitalization for chest pain with final diagnoses of pneumonia 9//2016.  The patient tells me of an episode 3 weeks ago where she has severe headache became weak, experienced chest discomfort, and left leg weakness.  She did not faint.  She was completing a shower when this occurred.  She did not have the strength to get dressed.  After greater than an hour she began feeling better.  Since that time she has noted some decreased strength in the left lower extremity.  She has angina pectoris responsive to sublingual nitroglycerin.  She denies orthopnea PND.  No lower extremity swelling is been noted.  No episodes of palpitation.   Past Medical History:  Diagnosis Date  . Chest pain   . Coronary artery disease   . GERD (gastroesophageal reflux disease)   . Hypertension   . Shortness of breath    laying down  . Stroke [facility])     Past Surgical History:  Procedure Laterality Date  . ABDOMINAL HYSTERECTOMY    . colonscopy    . ESOPHAGOGASTRODUODENOSCOPY    . ESOPHAGOGASTRODUODENOSCOPY  10/10/2011   Procedure: ESOPHAGOGASTRODUODENOSCOPY (EGD);  Surgeon: Winfield Cunas., MD;  Location: [facility] ENDOSCOPY;  Service: Endoscopy;  Laterality: N/A;  with control of bleeding  . ESOPHAGOGASTRODUODENOSCOPY  10/12/2011   Procedure: ESOPHAGOGASTRODUODENOSCOPY (EGD);  Surgeon: Winfield Cunas., MD;  Location: [facility] ENDOSCOPY;  Service: Endoscopy;  Laterality: N/A;  . ESOPHAGOGASTRODUODENOSCOPY (EGD) WITH PROPOFOL N/A  12/12/2016   Procedure: ESOPHAGOGASTRODUODENOSCOPY (EGD) WITH PROPOFOL;  Surgeon: Wilford Corner, MD;  Location: [facility] ENDOSCOPY;  Service: Endoscopy;  Laterality: N/A;  . FLEXIBLE SIGMOIDOSCOPY  10/12/2011   Procedure: FLEXIBLE SIGMOIDOSCOPY;  Surgeon: Winfield Cunas., MD;  Location: [facility] ENDOSCOPY;  Service: Endoscopy;  Laterality: N/A;  . stretching of EGD      Current Medications: Outpatient Medications Prior to Visit  Medication Sig Dispense Refill  . acetaminophen (TYLENOL) 650 MG CR tablet Take 1,300 mg by mouth 3 (three) times daily as needed for pain.    Marland Kitchen ALPRAZolam (XANAX) 0.25 MG tablet Take 0.25 mg by mouth 2 (two) times daily. As needed for anxiety. Takes at 2pm and bedtime    . aspirin EC 81 MG tablet Take 1 tablet (81 mg total) by mouth daily.    . Calcium Carbonate-Vitamin D (CALCIUM + D) 600-200 MG-UNIT TABS Take 1 tablet by mouth daily.     . clopidogrel (PLAVIX) 75 MG tablet Take 75 mg by mouth daily after lunch.     . diphenoxylate-atropine (LOMOTIL) 2.5-0.025 MG per tablet Take 2 tablets by mouth 4 (four) times daily as needed for diarrhea or loose stools.     Marland Kitchen escitalopram (LEXAPRO) 20 MG tablet Take 20 mg by mouth every morning.    . ferrous sulfate 325 (65 FE) MG tablet Take 325 mg by mouth 2 (two) times daily with a meal.    . fexofenadine (ALLEGRA) 180 MG tablet Take 180 mg by mouth daily.    . folic acid (  FOLVITE) 400 MCG tablet Take 800 mcg by mouth daily.    . furosemide (LASIX) 20 MG tablet Take 1 tablet by mouth every Monday, Wednesday and Friday 15 tablet 11  . isosorbide mononitrate (IMDUR) 120 MG 24 hr tablet TAKE 1 TABLET DAILY 90 tablet 3  . losartan (COZAAR) 100 MG tablet take one tablet (100 mg ) daily  1  . Lutein 20 MG TABS Take 1 tablet by mouth every morning.    . metoprolol (LOPRESSOR) 50 MG tablet Take 25 mg by mouth 2 (two) times daily.    . Multiple Vitamins-Minerals (CENTRUM SILVER PO) Take 1 tablet by mouth daily at 12 noon.    .  nitroGLYCERIN (NITROSTAT) 0.4 MG SL tablet Place 0.4 mg under the tongue every 5 (five) minutes as needed. As needed for chest pain.    . pantoprazole (PROTONIX) 40 MG tablet Take 1 tablet (40 mg total) by mouth 2 (two) times daily. 60 tablet 0  . RANEXA 500 MG 12 hr tablet TAKE 1 TABLET TWICE A DAY 180 tablet 2  . saccharomyces boulardii (FLORASTOR) 250 MG capsule Take 250 mg by mouth daily.     . timolol (TIMOPTIC-XR) 0.5 % ophthalmic gel-forming Place 1 drop into both eyes daily.    . travoprost, benzalkonium, (TRAVATAN) 0.004 % ophthalmic solution Place 1 drop into both eyes at bedtime.    . traZODone (DESYREL) 50 MG tablet Take 150 mg by mouth at bedtime.    Marland Kitchen amLODipine (NORVASC) 5 MG tablet Take 1 tablet (5 mg total) by mouth daily. 90 tablet 3   No facility-administered medications prior to visit.      Allergies:   Adhesive [tape]; Amoxicillin; Pregabalin; Simvastatin; and Sulfa antibiotics   Social History   Social History  . Marital status: Widowed    Spouse name: N/A  . Number of children: N/A  . Years of education: N/A   Social History Main Topics  . Smoking status: Never Smoker  . Smokeless tobacco: Never Used  . Alcohol use No     Comment: none in 20 years - 08/12/13  . Drug use: No  . Sexual activity: Not Asked   Other Topics Concern  . None   Social History Narrative  . None     Family History:  The patient's family history includes CAD in her brother, father, mother, and sister; Heart attack in her brother, father, mother, and sister; Hypertension in her sister; Pancreatic cancer in her mother.   ROS:   Please see the history of present illness.    Weight loss, liquid diet because of esophageal stricture, chest pain, leg swelling, occasional shortness of breath at night, shortness of breath with lying down, vision disturbance, cough, abdominal discomfort, depression, muscle pain, dizziness, passing out, easy bruising, headache, joint swelling, difficulty with  balance and walking, anxiety, irregular heartbeat, leg pain, and excessive fatigue. All other systems reviewed and are negative.   PHYSICAL EXAM:   VS:  BP (!) 146/56 (BP Location: Right Arm)   Pulse 65   Ht 5\' 3"  (1.6 m)   Wt 130 lb 12.8 oz (59.3 kg)   BMI 23.17 kg/m    GEN: Well nourished, well developed, in no acute distress  HEENT: normal  Neck: no JVD, carotid bruits, or masses Cardiac: RRR; no murmurs, rubs, or gallops,no edema  Respiratory:  clear to auscultation bilaterally, normal work of breathing GI: soft, nontender, nondistended, + BS MS: no deformity or atrophy  Skin: warm and dry, no  rash Neuro:  Alert and Oriented x 3, Strength and sensation are intact Psych: euthymic mood, full affect  Wt Readings from Last 3 Encounters:  05/15/17 130 lb 12.8 oz (59.3 kg)  03/09/17 129 lb (58.5 kg)  12/12/16 131 lb (59.4 kg)      Studies/Labs Reviewed:   EKG: is not performed today.  ECG performed on March 09, 2013.  reveals sinus rhythm, evidence of old inferior infarct, no acute changes.  This is a review of the EKG  Recent Labs: 12/11/2016: ALT 16 12/12/2016: BUN 9; Creatinine, Ser 0.83; Hemoglobin 12.3; Platelets 444; Potassium 3.9; Sodium 143   Lipid Panel No results found for: CHOL, TRIG, HDL, CHOLHDL, VLDL, LDLCALC, LDLDIRECT  Additional studies/ records that were reviewed today include:  No new data    ASSESSMENT:    1. Coronary artery disease involving native coronary artery of native heart without angina pectoris   2. Chronic diastolic heart failure ([place])   3. Essential hypertension      PLAN:  In order of problems listed above:  1. Stable angina responsive to nitroglycerin. 2. Volume status is good on today's exam.  Dyspnea/orthopnea has been less noticeable.  No change in therapy as felt indicated. 3. Blood pressure is adequately controlled.  No change in medical therapy.  Clinical follow-up in 1 year.  Discuss left lower extremity weakness with  primary care.  Medication Adjustments/Labs and Tests Ordered: Current medicines are reviewed at length with the patient today.  Concerns regarding medicines are outlined above.  Medication changes, Labs and Tests ordered today are listed in the Patient Instructions below. There are no Patient Instructions on file for this visit.   Signed, Sinclair Grooms, MD  05/15/2017 3:37 PM    [facility] [place], [place], [place]  [address] Phone: [phone]; Fax: [phone]

## 2017-05-15 NOTE — Patient Instructions (Signed)

## 2017-05-31 ENCOUNTER — Other Ambulatory Visit: Payer: Self-pay

## 2017-05-31 ENCOUNTER — Emergency Department (HOSPITAL_COMMUNITY): Payer: Medicare Other

## 2017-05-31 ENCOUNTER — Encounter (HOSPITAL_COMMUNITY): Payer: Self-pay | Admitting: Emergency Medicine

## 2017-05-31 DIAGNOSIS — I5021 Acute systolic (congestive) heart failure: Secondary | ICD-10-CM | POA: Diagnosis not present

## 2017-05-31 DIAGNOSIS — Z7189 Other specified counseling: Secondary | ICD-10-CM

## 2017-05-31 DIAGNOSIS — I959 Hypotension, unspecified: Secondary | ICD-10-CM | POA: Diagnosis present

## 2017-05-31 DIAGNOSIS — K219 Gastro-esophageal reflux disease without esophagitis: Secondary | ICD-10-CM | POA: Diagnosis present

## 2017-05-31 DIAGNOSIS — Z7902 Long term (current) use of antithrombotics/antiplatelets: Secondary | ICD-10-CM

## 2017-05-31 DIAGNOSIS — N179 Acute kidney failure, unspecified: Secondary | ICD-10-CM

## 2017-05-31 DIAGNOSIS — N189 Chronic kidney disease, unspecified: Secondary | ICD-10-CM | POA: Diagnosis present

## 2017-05-31 DIAGNOSIS — R7989 Other specified abnormal findings of blood chemistry: Secondary | ICD-10-CM

## 2017-05-31 DIAGNOSIS — Z515 Encounter for palliative care: Secondary | ICD-10-CM

## 2017-05-31 DIAGNOSIS — K2970 Gastritis, unspecified, without bleeding: Secondary | ICD-10-CM | POA: Diagnosis present

## 2017-05-31 DIAGNOSIS — I214 Non-ST elevation (NSTEMI) myocardial infarction: Secondary | ICD-10-CM | POA: Diagnosis not present

## 2017-05-31 DIAGNOSIS — Z66 Do not resuscitate: Secondary | ICD-10-CM | POA: Diagnosis not present

## 2017-05-31 DIAGNOSIS — R0602 Shortness of breath: Secondary | ICD-10-CM

## 2017-05-31 DIAGNOSIS — E871 Hypo-osmolality and hyponatremia: Secondary | ICD-10-CM | POA: Diagnosis not present

## 2017-05-31 DIAGNOSIS — Z8701 Personal history of pneumonia (recurrent): Secondary | ICD-10-CM | POA: Diagnosis not present

## 2017-05-31 DIAGNOSIS — I5043 Acute on chronic combined systolic (congestive) and diastolic (congestive) heart failure: Secondary | ICD-10-CM | POA: Diagnosis not present

## 2017-05-31 DIAGNOSIS — R404 Transient alteration of awareness: Secondary | ICD-10-CM | POA: Diagnosis not present

## 2017-05-31 DIAGNOSIS — R778 Other specified abnormalities of plasma proteins: Secondary | ICD-10-CM

## 2017-05-31 DIAGNOSIS — I25110 Atherosclerotic heart disease of native coronary artery with unstable angina pectoris: Secondary | ICD-10-CM | POA: Diagnosis present

## 2017-05-31 DIAGNOSIS — Z79899 Other long term (current) drug therapy: Secondary | ICD-10-CM

## 2017-05-31 DIAGNOSIS — Z8673 Personal history of transient ischemic attack (TIA), and cerebral infarction without residual deficits: Secondary | ICD-10-CM

## 2017-05-31 DIAGNOSIS — I130 Hypertensive heart and chronic kidney disease with heart failure and stage 1 through stage 4 chronic kidney disease, or unspecified chronic kidney disease: Principal | ICD-10-CM | POA: Diagnosis present

## 2017-05-31 DIAGNOSIS — F419 Anxiety disorder, unspecified: Secondary | ICD-10-CM | POA: Diagnosis present

## 2017-05-31 DIAGNOSIS — R531 Weakness: Secondary | ICD-10-CM | POA: Diagnosis not present

## 2017-05-31 DIAGNOSIS — I5041 Acute combined systolic (congestive) and diastolic (congestive) heart failure: Secondary | ICD-10-CM | POA: Diagnosis not present

## 2017-05-31 DIAGNOSIS — Z7982 Long term (current) use of aspirin: Secondary | ICD-10-CM

## 2017-05-31 DIAGNOSIS — Z8249 Family history of ischemic heart disease and other diseases of the circulatory system: Secondary | ICD-10-CM | POA: Diagnosis not present

## 2017-05-31 DIAGNOSIS — Z90710 Acquired absence of both cervix and uterus: Secondary | ICD-10-CM | POA: Diagnosis not present

## 2017-05-31 DIAGNOSIS — I509 Heart failure, unspecified: Secondary | ICD-10-CM

## 2017-05-31 DIAGNOSIS — J9601 Acute respiratory failure with hypoxia: Secondary | ICD-10-CM | POA: Diagnosis not present

## 2017-05-31 DIAGNOSIS — I340 Nonrheumatic mitral (valve) insufficiency: Secondary | ICD-10-CM | POA: Diagnosis not present

## 2017-05-31 LAB — COMPREHENSIVE METABOLIC PANEL
ALBUMIN: 3.6 g/dL (ref 3.5–5.0)
ALK PHOS: 58 U/L (ref 38–126)
ALT: 14 U/L (ref 14–54)
ANION GAP: 11 (ref 5–15)
AST: 27 U/L (ref 15–41)
BUN: 14 mg/dL (ref 6–20)
CALCIUM: 8.9 mg/dL (ref 8.9–10.3)
CHLORIDE: 86 mmol/L — AB (ref 101–111)
CO2: 22 mmol/L (ref 22–32)
Creatinine, Ser: 0.82 mg/dL (ref 0.44–1.00)
GFR calc non Af Amer: >60 mL/min (ref >60)
GLUCOSE: 169 mg/dL — AB (ref 65–99)
Potassium: 4.3 mmol/L (ref 3.5–5.1)
SODIUM: 119 mmol/L — AB (ref 135–145)
Total Bilirubin: 0.8 mg/dL (ref 0.3–1.2)
Total Protein: 6.7 g/dL (ref 6.5–8.1)

## 2017-05-31 LAB — CBC WITH DIFFERENTIAL/PLATELET
BASOS PCT: 0 %
Basophils Absolute: 0 10*3/uL (ref 0.0–0.1)
EOS ABS: 0.1 10*3/uL (ref 0.0–0.7)
EOS PCT: 0 %
HCT: 35.7 % — ABNORMAL LOW (ref 36.0–46.0)
HEMOGLOBIN: 12.7 g/dL (ref 12.0–15.0)
Lymphocytes Relative: 4 %
Lymphs Abs: 0.9 10*3/uL (ref 0.7–4.0)
MCH: 33.3 pg (ref 26.0–34.0)
MCHC: 35.6 g/dL (ref 30.0–36.0)
MCV: 93.7 fL (ref 78.0–100.0)
Monocytes Absolute: 1.5 10*3/uL — ABNORMAL HIGH (ref 0.1–1.0)
Monocytes Relative: 6 %
NEUTROS PCT: 90 %
Neutro Abs: 20.9 10*3/uL — ABNORMAL HIGH (ref 1.7–7.7)
PLATELETS: 512 10*3/uL — AB (ref 150–400)
RBC: 3.81 MIL/uL — AB (ref 3.87–5.11)
RDW: 13.9 % (ref 11.5–15.5)
WBC: 23.3 10*3/uL — AB (ref 4.0–10.5)

## 2017-05-31 LAB — URINALYSIS, ROUTINE W REFLEX MICROSCOPIC
Bilirubin Urine: NEGATIVE
GLUCOSE, UA: NEGATIVE mg/dL
Hgb urine dipstick: NEGATIVE
KETONES UR: NEGATIVE mg/dL
LEUKOCYTES UA: NEGATIVE
Nitrite: NEGATIVE
PH: 7 (ref 5.0–8.0)
PROTEIN: NEGATIVE mg/dL
SPECIFIC GRAVITY, URINE: 1.008 (ref 1.005–1.030)

## 2017-05-31 LAB — BRAIN NATRIURETIC PEPTIDE: B Natriuretic Peptide: 1295.7 pg/mL — ABNORMAL HIGH (ref 0.0–100.0)

## 2017-05-31 LAB — I-STAT TROPONIN, ED: TROPONIN I, POC: 1.36 ng/mL — AB (ref 0.00–0.08)

## 2017-05-31 LAB — MRSA PCR SCREENING: MRSA by PCR: NEGATIVE

## 2017-05-31 LAB — TROPONIN I: Troponin I: 1.4 ng/mL (ref <0.03)

## 2017-05-31 LAB — HEPARIN LEVEL (UNFRACTIONATED): Heparin Unfractionated: 0.37 IU/mL (ref 0.30–0.70)

## 2017-05-31 MED ORDER — TIMOLOL MALEATE 0.5 % OP SOLN
1.0000 [drp] | Freq: Every day | OPHTHALMIC | Status: DC
  Administered 2017-06-01 – 2017-06-04 (×4): 1 [drp] via OPHTHALMIC
  Filled 2017-05-31: qty 5

## 2017-05-31 MED ORDER — ONDANSETRON HCL 4 MG/2ML IJ SOLN
4.0000 mg | Freq: Four times a day (QID) | INTRAMUSCULAR | Status: DC | PRN
  Administered 2017-06-02 (×2): 4 mg via INTRAVENOUS
  Filled 2017-05-31 (×3): qty 2

## 2017-05-31 MED ORDER — ALPRAZOLAM 0.25 MG PO TABS
0.2500 mg | ORAL_TABLET | Freq: Two times a day (BID) | ORAL | Status: DC | PRN
  Administered 2017-06-01: 0.25 mg via ORAL
  Filled 2017-05-31 (×2): qty 1

## 2017-05-31 MED ORDER — FUROSEMIDE 10 MG/ML IJ SOLN
20.0000 mg | Freq: Once | INTRAMUSCULAR | Status: AC
  Administered 2017-05-31: 20 mg via INTRAVENOUS
  Filled 2017-05-31: qty 4

## 2017-05-31 MED ORDER — SODIUM CHLORIDE 0.9 % IV SOLN: 250.0000 mL | INTRAVENOUS | Status: DC | PRN

## 2017-05-31 MED ORDER — LATANOPROST 0.005 % OP SOLN
1.0000 [drp] | Freq: Every day | OPHTHALMIC | Status: DC
  Administered 2017-05-31 – 2017-06-02 (×3): 1 [drp] via OPHTHALMIC
  Filled 2017-05-31: qty 2.5

## 2017-05-31 MED ORDER — NITROGLYCERIN IN D5W 200-5 MCG/ML-% IV SOLN
0.0000 ug/min | INTRAVENOUS | Status: DC
Start: 2017-05-31 — End: 2017-06-02
  Administered 2017-05-31: 5 ug/min via INTRAVENOUS
  Administered 2017-06-01: 75 ug/min via INTRAVENOUS
  Administered 2017-06-02: 40 ug/min via INTRAVENOUS
  Administered 2017-06-02: 95 ug/min via INTRAVENOUS
  Filled 2017-05-31 (×4): qty 250

## 2017-05-31 MED ORDER — TRAZODONE HCL 50 MG PO TABS
50.0000 mg | ORAL_TABLET | Freq: Every evening | ORAL | Status: DC | PRN
  Administered 2017-05-31 – 2017-06-03 (×2): 50 mg via ORAL
  Filled 2017-05-31: qty 1

## 2017-05-31 MED ORDER — ACETAMINOPHEN 325 MG PO TABS: 650.0000 mg | ORAL_TABLET | ORAL | Status: DC | PRN

## 2017-05-31 MED ORDER — METOPROLOL TARTRATE 25 MG PO TABS
25.0000 mg | ORAL_TABLET | Freq: Two times a day (BID) | ORAL | Status: DC
  Administered 2017-05-31 – 2017-06-02 (×5): 25 mg via ORAL
  Filled 2017-05-31 (×5): qty 1

## 2017-05-31 MED ORDER — PANTOPRAZOLE SODIUM 40 MG PO TBEC
40.0000 mg | DELAYED_RELEASE_TABLET | Freq: Two times a day (BID) | ORAL | Status: DC
  Administered 2017-05-31 – 2017-06-02 (×5): 40 mg via ORAL
  Filled 2017-05-31 (×5): qty 1

## 2017-05-31 MED ORDER — ESCITALOPRAM OXALATE 20 MG PO TABS
20.0000 mg | ORAL_TABLET | Freq: Every morning | ORAL | Status: DC
  Administered 2017-06-01 – 2017-06-02 (×2): 20 mg via ORAL
  Filled 2017-05-31 (×2): qty 1

## 2017-05-31 MED ORDER — ONDANSETRON HCL 4 MG/2ML IJ SOLN
4.0000 mg | Freq: Once | INTRAMUSCULAR | Status: AC
  Administered 2017-05-31: 4 mg via INTRAVENOUS
  Filled 2017-05-31: qty 2

## 2017-05-31 MED ORDER — FOLIC ACID 1 MG PO TABS
1.0000 mg | ORAL_TABLET | Freq: Every day | ORAL | Status: DC
  Administered 2017-06-01 – 2017-06-02 (×2): 1 mg via ORAL
  Filled 2017-05-31 (×2): qty 1

## 2017-05-31 MED ORDER — CLOPIDOGREL BISULFATE 75 MG PO TABS
75.0000 mg | ORAL_TABLET | Freq: Every day | ORAL | Status: DC
  Administered 2017-05-31 – 2017-06-02 (×3): 75 mg via ORAL
  Filled 2017-05-31 (×3): qty 1

## 2017-05-31 MED ORDER — NITROGLYCERIN 0.4 MG SL SUBL: 0.4000 mg | SUBLINGUAL_TABLET | SUBLINGUAL | Status: DC | PRN

## 2017-05-31 MED ORDER — NITROGLYCERIN 2 % TD OINT
0.5000 [in_us] | TOPICAL_OINTMENT | Freq: Four times a day (QID) | TRANSDERMAL | Status: DC
  Filled 2017-05-31: qty 30

## 2017-05-31 MED ORDER — FUROSEMIDE 10 MG/ML IJ SOLN
40.0000 mg | Freq: Two times a day (BID) | INTRAMUSCULAR | Status: DC
  Administered 2017-05-31 – 2017-06-02 (×4): 40 mg via INTRAVENOUS
  Filled 2017-05-31 (×4): qty 4

## 2017-05-31 MED ORDER — SODIUM CHLORIDE 0.9% FLUSH: 3.0000 mL | INTRAVENOUS | Status: DC | PRN

## 2017-05-31 MED ORDER — LOSARTAN POTASSIUM 50 MG PO TABS
50.0000 mg | ORAL_TABLET | Freq: Every day | ORAL | Status: DC
  Administered 2017-06-01: 50 mg via ORAL
  Filled 2017-05-31 (×2): qty 1

## 2017-05-31 MED ORDER — TRAVOPROST 0.004 % OP SOLN: 1.0000 [drp] | Freq: Every day | OPHTHALMIC | Status: DC

## 2017-05-31 MED ORDER — SODIUM CHLORIDE 0.9% FLUSH
3.0000 mL | Freq: Two times a day (BID) | INTRAVENOUS | Status: DC
  Administered 2017-06-01: 3 mL via INTRAVENOUS

## 2017-05-31 MED ORDER — HEPARIN BOLUS VIA INFUSION
3000.0000 [IU] | Freq: Once | INTRAVENOUS | Status: AC
  Administered 2017-05-31: 3000 [IU] via INTRAVENOUS
  Filled 2017-05-31: qty 3000

## 2017-05-31 MED ORDER — SACCHAROMYCES BOULARDII 250 MG PO CAPS
250.0000 mg | ORAL_CAPSULE | Freq: Every day | ORAL | Status: DC
  Administered 2017-06-01 – 2017-06-02 (×2): 250 mg via ORAL
  Filled 2017-05-31 (×2): qty 1

## 2017-05-31 MED ORDER — HEPARIN (PORCINE) IN NACL 100-0.45 UNIT/ML-% IJ SOLN
700.0000 [IU]/h | INTRAMUSCULAR | Status: DC
  Administered 2017-05-31: 700 [IU]/h via INTRAVENOUS
  Filled 2017-05-31: qty 250

## 2017-05-31 MED ORDER — SODIUM CHLORIDE 0.9 % IV BOLUS (SEPSIS)
500.0000 mL | Freq: Once | INTRAVENOUS | Status: AC
  Administered 2017-05-31: 500 mL via INTRAVENOUS

## 2017-05-31 MED ORDER — ASPIRIN EC 81 MG PO TBEC
81.0000 mg | DELAYED_RELEASE_TABLET | Freq: Every day | ORAL | Status: DC
  Administered 2017-06-01 – 2017-06-02 (×2): 81 mg via ORAL
  Filled 2017-05-31 (×2): qty 1

## 2017-05-31 MED ORDER — RANOLAZINE ER 500 MG PO TB12
500.0000 mg | ORAL_TABLET | Freq: Two times a day (BID) | ORAL | Status: DC
  Administered 2017-05-31 – 2017-06-02 (×5): 500 mg via ORAL
  Filled 2017-05-31 (×6): qty 1

## 2017-05-31 NOTE — ED Provider Notes (Signed)
[facility] DEPT Provider Note   CSN: [id number] Arrival date & time: 06/14/2017  1144     History   Chief Complaint Chief Complaint  Patient presents with  . Shortness of Breath  . Weakness    HPI Ellen Marshall is a 83 y.o. female.  The history is provided by the patient and medical records. No language interpreter was used.   Ellen Marshall is a 83 y.o. female  with a PMH of CAD, prior stroke, GERD, CHF who presents to the Emergency Department complaining of shortness of breath which began last night around 9:30 PM. Associated with nausea and chest pain. Patient states that she used her mouthwash last night at this time and accidentally swallowed some of it. She notes that it felt like a "torch" down her chest and abdomen. About an hour later, she started developing shortness of breath. She states that she just cannot take a deep breath. No medications taken prior to arrival for symptoms. On 83 ASA and plavix as well as lasix. No fever, chills, vomiting, back pain or urinary symptoms. Was not able to take medications last night or this morning due to nausea and trouble breathing.  Past Medical History:  Diagnosis Date  . Chest pain   . Coronary artery disease   . GERD (gastroesophageal reflux disease)   . Hypertension   . Shortness of breath    laying down  . Stroke [facility])     Patient Active Problem List   Diagnosis Date Noted  . Food impaction of esophagus 12/11/2016  . Pain   . Shortness of breath   . Stroke ([place])   . Colitis   . Chronic diastolic heart failure ([place]) 03/25/2014  . Carotid stenosis 03/25/2014  . Bilateral carotid bruits 02/16/2014  . Chest pain   . CAP (community acquired pneumonia) 08/13/2013  . Syncope 08/12/2013  . Bradycardia 08/12/2013  . Hematoma of right parietal scalp 08/12/2013  . HTN (hypertension) 08/12/2013  . Acute renal failure ([place]) 08/12/2013  . GI bleed 10/10/2011  . Anemia due to blood loss, acute  10/10/2011  . Hyponatremia 10/09/2011  . Diarrhea 10/09/2011  . Vomiting 10/09/2011  . Weakness 10/09/2011  . Leukocytosis 10/09/2011  . Thrombocythemia ([place]) 10/09/2011  . GERD (gastroesophageal reflux disease) 10/09/2011  . CAD (coronary artery disease) 10/09/2011    Past Surgical History:  Procedure Laterality Date  . ABDOMINAL HYSTERECTOMY    . colonscopy    . ESOPHAGOGASTRODUODENOSCOPY    . ESOPHAGOGASTRODUODENOSCOPY  10/10/2011   Procedure: ESOPHAGOGASTRODUODENOSCOPY (EGD);  Surgeon: Winfield Cunas., MD;  Location: [facility] ENDOSCOPY;  Service: Endoscopy;  Laterality: N/A;  with control of bleeding  . ESOPHAGOGASTRODUODENOSCOPY  10/12/2011   Procedure: ESOPHAGOGASTRODUODENOSCOPY (EGD);  Surgeon: Winfield Cunas., MD;  Location: [facility] ENDOSCOPY;  Service: Endoscopy;  Laterality: N/A;  . ESOPHAGOGASTRODUODENOSCOPY (EGD) WITH PROPOFOL N/A 12/12/2016   Procedure: ESOPHAGOGASTRODUODENOSCOPY (EGD) WITH PROPOFOL;  Surgeon: Wilford Corner, MD;  Location: [facility] ENDOSCOPY;  Service: Endoscopy;  Laterality: N/A;  . FLEXIBLE SIGMOIDOSCOPY  10/12/2011   Procedure: FLEXIBLE SIGMOIDOSCOPY;  Surgeon: Winfield Cunas., MD;  Location: [facility] ENDOSCOPY;  Service: Endoscopy;  Laterality: N/A;  . stretching of EGD      OB History    No data available       Home Medications    Prior to Admission medications   Medication Sig Start Date End Date Taking? Authorizing Provider  acetaminophen (TYLENOL) 650 MG CR tablet Take 1,300 mg by mouth 3 (  three) times daily as needed for pain.   Yes [provider]  ALPRAZolam (XANAX) 0.25 MG tablet Take 0.25 mg by mouth 2 (two) times daily. As needed for anxiety. Takes at 2pm and bedtime   Yes [provider]  amLODipine (NORVASC) 5 MG tablet Take 5 mg by mouth daily.   Yes [provider]  aspirin EC 81 MG tablet Take 81 mg daily by mouth.   Yes [provider]  clopidogrel (PLAVIX) 75 MG tablet Take 75 mg by mouth daily after  lunch.    Yes [provider]  escitalopram (LEXAPRO) 20 MG tablet Take 20 mg by mouth every morning.   Yes [provider]  ferrous sulfate 325 (65 FE) MG tablet Take 325 mg by mouth 2 (two) times daily with a meal.   Yes [provider]  folic acid (FOLVITE) 196 MCG tablet Take 800 mcg by mouth daily.   Yes [provider]  furosemide (LASIX) 20 MG tablet Take 1 tablet by mouth every Monday, Wednesday and Friday 03/21/17  Yes Belva Crome, MD  isosorbide mononitrate (IMDUR) 120 MG 24 hr tablet TAKE 1 TABLET DAILY 05/19/16  Yes Richardson Dopp T, PA-C  losartan (COZAAR) 100 MG tablet take one tablet (100 mg ) daily 01/27/16  Yes [provider]  metoprolol (LOPRESSOR) 50 MG tablet Take 25 mg by mouth 2 (two) times daily.   Yes [provider]  nitroGLYCERIN (NITROSTAT) 0.4 MG SL tablet Place 0.4 mg under the tongue every 5 (five) minutes as needed. As needed for chest pain.   Yes [provider]  pantoprazole (PROTONIX) 40 MG tablet Take 1 tablet (40 mg total) by mouth 2 (two) times daily. 12/12/16  Yes Aline August, MD  RANEXA 500 MG 12 hr tablet TAKE 1 TABLET TWICE A DAY 05/10/17  Yes Belva Crome, MD  saccharomyces boulardii (FLORASTOR) 250 MG capsule Take 250 mg by mouth daily.    Yes [provider]  timolol (TIMOPTIC-XR) 0.5 % ophthalmic gel-forming Place 1 drop into both eyes daily.   Yes [provider]  travoprost, benzalkonium, (TRAVATAN) 0.004 % ophthalmic solution Place 1 drop into both eyes at bedtime.   Yes [provider]  traZODone (DESYREL) 50 MG tablet Take 150 mg by mouth at bedtime.   Yes [provider]    Family History Family History  Problem Relation Age of Onset  . Pancreatic cancer Mother   . CAD Mother   . Heart attack Mother   . CAD Father   . Heart attack Father   . CAD Sister   . Hypertension Sister   . CAD Brother   . Heart attack Sister   . Heart attack  Brother     Social History Social History   Tobacco Use  . Smoking status: Never Smoker  . Smokeless tobacco: Never Used  Substance Use Topics  . Alcohol use: No    Comment: none in 20 years - 08/12/13  . Drug use: No     Allergies   Adhesive [tape]; Amoxicillin; Pregabalin; Simvastatin; and Sulfa antibiotics   Review of Systems Review of Systems  Respiratory: Positive for shortness of breath. Negative for cough, choking and wheezing.   Cardiovascular: Positive for chest pain.  Neurological: Positive for weakness.  All other systems reviewed and are negative.    Physical Exam Updated Vital Signs BP (!) 142/72   Pulse 88   Temp 99.7 F (37.6 C) (Rectal)  Resp (!) 28   Ht 5' 3.5" (1.613 m)   Wt 59 kg (130 lb)   SpO2 96%   BMI 22.67 kg/m   Physical Exam  Constitutional: She is oriented to person, place, and time. She appears well-developed and well-nourished. No distress.  HENT:  Head: Normocephalic and atraumatic.  Cardiovascular: Normal rate, regular rhythm and normal heart sounds.  No murmur heard. Pulmonary/Chest: Effort normal. No respiratory distress. She has no wheezes.  Lung sounds diminished at bases.  Abdominal: Soft. She exhibits no distension. There is no tenderness.  Neurological: She is alert and oriented to person, place, and time.  Skin: Skin is warm and dry.  Nursing note and vitals reviewed.    ED Treatments / Results  Labs (all labs ordered are listed, but only abnormal results are displayed) Labs Reviewed  BRAIN NATRIURETIC PEPTIDE - Abnormal; Notable for the following components:      Result Value   B Natriuretic Peptide 1,295.7 (*)    All other components within normal limits  CBC WITH DIFFERENTIAL/PLATELET - Abnormal; Notable for the following components:   WBC 23.3 (*)    RBC 3.81 (*)    HCT 35.7 (*)    Platelets 512 (*)    Neutro Abs 20.9 (*)    Monocytes Absolute 1.5 (*)    All other components within normal limits    COMPREHENSIVE METABOLIC PANEL - Abnormal; Notable for the following components:   Sodium 119 (*)    Chloride 86 (*)    Glucose, Bld 169 (*)    All other components within normal limits  I-STAT TROPONIN, ED - Abnormal; Notable for the following components:   Troponin i, poc 1.36 (*)    All other components within normal limits  URINALYSIS, ROUTINE W REFLEX MICROSCOPIC  HEPARIN LEVEL (UNFRACTIONATED)    EKG  EKG Interpretation  Date/Time:  Thursday May 31 2017 11:59:04 EST Ventricular Rate:  88 PR Interval:    QRS Duration: 87 QT Interval:  372 QTC Calculation: 451 R Axis:   -18 Text Interpretation:  Sinus rhythm Borderline left axis deviation Probable anteroseptal infarct, recent Minimal ST elevation, inferior leads Lateral leads are also involved nonspecific t wave changes since last tracing Confirmed by Dorie Rank [phone]) on 05/23/2017 12:11:43 PM       Radiology Dg Chest 2 View  Result Date: 05/26/2017 CLINICAL DATA:  Worsening weakness and shortness of breath over the last week. EXAM: CHEST  2 VIEW COMPARISON:  12/11/2016 FINDINGS: Heart size is normal. Chronic aortic atherosclerosis. There is interstitial and alveolar edema with small amount of bilateral pleural fluid. Findings are most consistent with congestive heart failure. No evidence of focal consolidation or lobar collapse. No significant bone finding. IMPRESSION: Congestive heart failure with interstitial and early alveolar edema and small effusions. Electronically Signed   By: Nelson Chimes M.D.   On: 05/23/2017 12:37    Procedures Procedures (including critical care time)  CRITICAL CARE Performed by: Ozella Almond Tyriq Moragne   Total critical care time: 35 minutes  Critical care time was exclusive of separately billable procedures and treating other patients.  Critical care was necessary to treat or prevent imminent or life-threatening deterioration.  Critical care was time spent personally by me on the  following activities: development of treatment plan with patient and/or surrogate as well as nursing, discussions with consultants, evaluation of patient's response to treatment, examination of patient, obtaining history from patient or surrogate, ordering and performing treatments and interventions, ordering and review of laboratory  studies, ordering and review of radiographic studies, pulse oximetry and re-evaluation of patient's condition.   Medications Ordered in ED Medications  heparin ADULT infusion 100 units/mL (25000 units/262mL sodium chloride 0.45%) (700 Units/hr Intravenous New Bag/Given 06/14/2017 1303)  nitroGLYCERIN (NITROSTAT) SL tablet 0.4 mg (not administered)  sodium chloride 0.9 % bolus 500 mL (500 mLs Intravenous New Bag/Given 05/27/2017 1238)  heparin bolus via infusion 3,000 Units (3,000 Units Intravenous Bolus from Bag 06/02/2017 1303)  furosemide (LASIX) injection 20 mg (20 mg Intravenous Given 06/15/2017 1332)  ondansetron (ZOFRAN) injection 4 mg (4 mg Intravenous Given 06/12/2017 1350)     Initial Impression / Assessment and Plan / ED Course  I have reviewed the triage vital signs and the nursing notes.  Pertinent labs & imaging results that were available during my care of the patient were reviewed by me and considered in my medical decision making (see chart for details).    Ellen Marshall is a 83 y.o. female who presents to ED for shortness of breath associated with nausea and chest pain. EKG with t wave changes. Troponin elevated at 1.36. [facility], Dr. Debara Pickett who will come evaluate patient. Started on heparin and nitro. BNP also elevated as well at 1295. CXR with signs of CHF. Lasix given. CMP notable for sodium of 119. Hospitalist consulted who will admit.   Patient seen by and discussed with Dr. Tomi Bamberger who agrees with treatment plan.    Final Clinical Impressions(s) / ED Diagnoses   Final diagnoses:  Shortness of breath    ED Discharge Orders    None        Santanna Whitford, Ozella Almond, PA-C 06/02/2017 1450    Dorie Rank, MD 06/01/17 [phone]

## 2017-05-31 NOTE — ED Notes (Addendum)
Call report Ellen Marshall [phone]

## 2017-05-31 NOTE — H&P (Signed)
[facility] History and Physical  Allyn Bartelson OZD:[id number] DOB: 09-16-1925 DOA: 06/04/2017   PCP: Josetta Huddle, MD  Specialists: Dr. Tamala Julian is her cardiologist  Chief Complaint: Shortness of breath and cough ongoing for the past 5 days  HPI: Ellen Marshall is a 83 y.o. female with a past medical history of chronic angina, chronic diastolic congestive heart failure, prior stroke who lives in an assisted living facility and presented to the hospital complaining of shortness of breath ongoing for the past 5 days.  Patient is accompanied by her son and daughter-in-law.  Patient is a poor historian.  Apparently she developed chest pain last night which kept her up all night.  She took 2 nitroglycerin without much relief.  This morning she and her family decided to seek attention.  Continues to have chest pain rating about 5 out of 10 in intensity.  Some nausea but no vomiting.  She does not know if she has leg swelling.  Denies any dizziness lightheadedness or palpitations.  In the emergency department evaluation revealed positive troponin and a chest x-ray showed pulmonary edema.  She was also noted to have hyponatremia.  She will need hospitalization for further management.  Home Medications: Prior to Admission medications   Medication Sig Start Date End Date Taking? Authorizing Provider  acetaminophen (TYLENOL) 650 MG CR tablet Take 1,300 mg by mouth 3 (three) times daily as needed for pain.   Yes [provider]  ALPRAZolam (XANAX) 0.25 MG tablet Take 0.25 mg by mouth 2 (two) times daily. As needed for anxiety. Takes at 2pm and bedtime   Yes [provider]  amLODipine (NORVASC) 5 MG tablet Take 5 mg by mouth daily.   Yes [provider]  aspirin EC 81 MG tablet Take 81 mg daily by mouth.   Yes [provider]  clopidogrel (PLAVIX) 75 MG tablet Take 75 mg by mouth daily after lunch.    Yes [provider]  escitalopram (LEXAPRO) 20 MG tablet  Take 20 mg by mouth every morning.   Yes [provider]  ferrous sulfate 325 (65 FE) MG tablet Take 325 mg by mouth 2 (two) times daily with a meal.   Yes [provider]  folic acid (FOLVITE) 259 MCG tablet Take 800 mcg by mouth daily.   Yes [provider]  furosemide (LASIX) 20 MG tablet Take 1 tablet by mouth every Monday, Wednesday and Friday 03/21/17  Yes Belva Crome, MD  isosorbide mononitrate (IMDUR) 120 MG 24 hr tablet TAKE 1 TABLET DAILY 05/19/16  Yes Richardson Dopp T, PA-C  losartan (COZAAR) 100 MG tablet take one tablet (100 mg ) daily 01/27/16  Yes [provider]  metoprolol (LOPRESSOR) 50 MG tablet Take 25 mg by mouth 2 (two) times daily.   Yes [provider]  nitroGLYCERIN (NITROSTAT) 0.4 MG SL tablet Place 0.4 mg under the tongue every 5 (five) minutes as needed. As needed for chest pain.   Yes [provider]  pantoprazole (PROTONIX) 40 MG tablet Take 1 tablet (40 mg total) by mouth 2 (two) times daily. 12/12/16  Yes Aline August, MD  RANEXA 500 MG 12 hr tablet TAKE 1 TABLET TWICE A DAY 05/10/17  Yes Belva Crome, MD  saccharomyces boulardii (FLORASTOR) 250 MG capsule Take 250 mg by mouth daily.    Yes [provider]  timolol (TIMOPTIC-XR) 0.5 % ophthalmic gel-forming Place 1 drop into both eyes daily.   Yes [provider]  travoprost, benzalkonium, (  TRAVATAN) 0.004 % ophthalmic solution Place 1 drop into both eyes at bedtime.   Yes [provider]  traZODone (DESYREL) 50 MG tablet Take 150 mg by mouth at bedtime.   Yes [provider]    Allergies:  Allergies  Allergen Reactions  . Adhesive [Tape] Other (See Comments)    Burning, Itching.  . Amoxicillin Diarrhea    Has patient had a PCN reaction causing immediate rash, facial/tongue/throat swelling, SOB or lightheadedness with hypotension: No Has patient had a PCN reaction causing severe rash involving mucus membranes or skin  necrosis:No Did patient had a PCN reaction that required hospitalization:No Has patient had a PCN reaction occurring within the last 10 years: Yes If all of the above answers are "NO", then may proceed with Cephalosporin use.   . Pregabalin     Weakness, diarrhea   . Simvastatin     Myalgias   . Sulfa Antibiotics Itching    Past Medical History: Past Medical History:  Diagnosis Date  . Chest pain   . Coronary artery disease   . GERD (gastroesophageal reflux disease)   . Hypertension   . Shortness of breath    laying down  . Stroke [facility])     Past Surgical History:  Procedure Laterality Date  . ABDOMINAL HYSTERECTOMY    . colonscopy    . ESOPHAGOGASTRODUODENOSCOPY    . ESOPHAGOGASTRODUODENOSCOPY  10/10/2011   Procedure: ESOPHAGOGASTRODUODENOSCOPY (EGD);  Surgeon: Winfield Cunas., MD;  Location: [facility] ENDOSCOPY;  Service: Endoscopy;  Laterality: N/A;  with control of bleeding  . ESOPHAGOGASTRODUODENOSCOPY  10/12/2011   Procedure: ESOPHAGOGASTRODUODENOSCOPY (EGD);  Surgeon: Winfield Cunas., MD;  Location: [facility] ENDOSCOPY;  Service: Endoscopy;  Laterality: N/A;  . ESOPHAGOGASTRODUODENOSCOPY (EGD) WITH PROPOFOL N/A 12/12/2016   Procedure: ESOPHAGOGASTRODUODENOSCOPY (EGD) WITH PROPOFOL;  Surgeon: Wilford Corner, MD;  Location: [facility] ENDOSCOPY;  Service: Endoscopy;  Laterality: N/A;  . FLEXIBLE SIGMOIDOSCOPY  10/12/2011   Procedure: FLEXIBLE SIGMOIDOSCOPY;  Surgeon: Winfield Cunas., MD;  Location: [facility] ENDOSCOPY;  Service: Endoscopy;  Laterality: N/A;  . stretching of EGD      Social History: Lives in an assisted living facility. Social History   Socioeconomic History  . Marital status: Widowed    Spouse name: Not on file  . Number of children: Not on file  . Years of education: Not on file  . Highest education level: Not on file  Social Needs  . Financial resource strain: Not on file  . Food insecurity - worry: Not on file  . Food insecurity - inability: Not on file  .  Transportation needs - medical: Not on file  . Transportation needs - non-medical: Not on file  Occupational History  . Not on file  Tobacco Use  . Smoking status: Never Smoker  . Smokeless tobacco: Never Used  Substance and Sexual Activity  . Alcohol use: No    Comment: none in 20 years - 08/12/13  . Drug use: No  . Sexual activity: Not on file  Other Topics Concern  . Not on file  Social History Narrative  . Not on file    Family History:  Family History  Problem Relation Age of Onset  . Pancreatic cancer Mother   . CAD Mother   . Heart attack Mother   . CAD Father   . Heart attack Father   . CAD Sister   . Hypertension Sister   . CAD Brother   . Heart attack Sister   . Heart  attack Brother      Review of Systems - History obtained from the patient General ROS: positive for  - fatigue Psychological ROS: negative Ophthalmic ROS: positive for - decreased vision ENT ROS: negative Allergy and Immunology ROS: negative Hematological and Lymphatic ROS: negative Endocrine ROS: negative Respiratory ROS: as in hpi Cardiovascular ROS: as in hpi Gastrointestinal ROS: no abdominal pain, change in bowel habits, or black or bloody stools Genito-Urinary ROS: no dysuria, trouble voiding, or hematuria Musculoskeletal ROS: negative Neurological ROS: no TIA or stroke symptoms Dermatological ROS: negative  Physical Examination  Vitals:   05/24/2017 1430 05/22/2017 1500 05/30/2017 1515 05/26/2017 1548  BP: 133/66 133/85 134/67 132/61  Pulse: 84 85 86 86  Resp: 20 18  18   Temp:    98.4 F (36.9 C)  TempSrc:    Oral  SpO2: 96% 97% 96% 98%  Weight:      Height:        BP 132/61   Pulse 86   Temp 98.4 F (36.9 C) (Oral)   Resp 18   Ht 5' 3.5" (1.613 m)   Wt 59 kg (130 lb)   SpO2 98%   BMI 22.67 kg/m   General appearance: alert, cooperative, appears stated age, distracted and no distress Head: Normocephalic, without obvious abnormality, atraumatic Eyes: conjunctivae/corneas  clear. PERRL, EOM's intact. Throat: lips, mucosa, and tongue normal; teeth and gums normal Neck: no adenopathy, no carotid bruit, no JVD, supple, symmetrical, trachea midline and thyroid not enlarged, symmetric, no tenderness/mass/nodules Resp: Patient has crackles bilateral lungs.  No wheezing.  No rhonchi.  Mildly tachypneic.  No use of accessory muscles. Cardio: regular rate and rhythm, S1, S2 normal, no murmur, click, rub or gallop GI: soft, non-tender; bowel sounds normal; no masses,  no organomegaly Extremities: Patient has about 1+ edema bilateral lower extremities Pulses: 2+ and symmetric Lymph nodes: Cervical, supraclavicular, and axillary nodes normal. Neurologic: No obvious focal neurological deficits.   Labs on Admission: I have personally reviewed following labs and imaging studies  CBC: Recent Labs  Lab 05/30/2017 1202  WBC 23.3*  NEUTROABS 20.9*  HGB 12.7  HCT 35.7*  MCV 93.7  PLT 409*   Basic Metabolic Panel: Recent Labs  Lab 05/30/2017 1202  NA 119*  K 4.3  CL 86*  CO2 22  GLUCOSE 169*  BUN 14  CREATININE 0.82  CALCIUM 8.9   GFR: Estimated Creatinine Clearance: 39.4 mL/min (by C-G formula based on SCr of 0.82 mg/dL). Liver Function Tests: Recent Labs  Lab 05/29/2017 1202  AST 27  ALT 14  ALKPHOS 58  BILITOT 0.8  PROT 6.7  ALBUMIN 3.6    Radiological Exams on Admission: Dg Chest 2 View  Result Date: 05/30/2017 CLINICAL DATA:  Worsening weakness and shortness of breath over the last week. EXAM: CHEST  2 VIEW COMPARISON:  12/11/2016 FINDINGS: Heart size is normal. Chronic aortic atherosclerosis. There is interstitial and alveolar edema with small amount of bilateral pleural fluid. Findings are most consistent with congestive heart failure. No evidence of focal consolidation or lobar collapse. No significant bone finding. IMPRESSION: Congestive heart failure with interstitial and early alveolar edema and small effusions. Electronically Signed   By: Nelson Chimes M.D.   On: 06/10/2017 12:37    My interpretation of Electrocardiogram: Sinus rhythm in the 80s.  Left axis deviation.  Nonspecific T wave changes.  No concerning ST segment changes.   Problem List  Principal Problem:   Acute congestive heart failure (HCC) Active Problems:  Hyponatremia with excess extracellular fluid volume   Weakness   NSTEMI (non-ST elevated myocardial infarction) (HCC)   Acute CHF (congestive heart failure) (HCC)   Assessment: This is a 83 year old Caucasian female with past medical history as stated earlier who presents with a few day history of shortness of breath and onset of chest pain last night.  She is noted to have elevated troponin and elevated BNP.  She has hyponatremia.  Her presentation is consistent with acute congestive heart failure.  She also may have had a non-ST elevation MI.  Plan: #1 acute congestive heart failure, unspecified: Previously her systolic function has been normal.  Echocardiogram has been ordered and will be followed up on.  CHF order set initiated.  She will be placed on diuretics, beta-blocker.  She is already on ARB.  #2  Non-ST elevation MI: She has significantly elevated troponin level.  Patient has been started on IV heparin.  She is on aspirin and Plavix which will be continued.  His troponin levels will be trended.  #3 leukocytosis: She is afebrile.  No obvious source of infection is identified.  Continue to monitor for now.  This could be reactive.  We will repeat labs tomorrow.  #4  Hyponatremia: Most likely due to hypervolemia.  Monitor closely while she is being diuresed.   DVT Prophylaxis: On IV heparin Code Status: DNR Family Communication: Discussed with the patient's son Consults called: Cardiology  Severity of Illness: The appropriate patient status for this patient is INPATIENT. Inpatient status is judged to be reasonable and necessary in order to provide the required intensity of service to ensure the  patient's safety. The patient's presenting symptoms, physical exam findings, and initial radiographic and laboratory data in the context of their chronic comorbidities is felt to place them at high risk for further clinical deterioration. Furthermore, it is not anticipated that the patient will be medically stable for discharge from the hospital within 2 midnights of admission. The following factors support the patient status of inpatient.   " The patient's presenting symptoms include chest pain shortness of breath. " The worrisome physical exam findings include crackles in the lung. " The initial radiographic and laboratory data are worrisome because of pulmonary edema, elevated troponin. " The chronic co-morbidities include hypertension.   * I certify that at the point of admission it is my clinical judgment that the patient will require inpatient hospital care spanning beyond 2 midnights from the point of admission due to high intensity of service, high risk for further deterioration and high frequency of surveillance required.*  Further management decisions will depend on results of further testing and patient's response to treatment.   Bonnielee Haff  [facility] Pager [phone]  If 7PM-7AM, please contact night-coverage [email or web address] Password [facility]  06/06/2017, 4:57 PM

## 2017-05-31 NOTE — ED Notes (Signed)
Pt placed on Purewick catheter to obtain urine sample. Pt was cleaned prior to placement.

## 2017-05-31 NOTE — ED Notes (Signed)
Family at bedside. 

## 2017-05-31 NOTE — Progress Notes (Addendum)
CRITICAL VALUE ALERT  Critical Value:  troponin  Date & Time Notied:  6:28 PM 06/04/2017  Provider Notified: Dr. Maryland Pink  Orders Received/Actions taken: Transfer orders for stepdown

## 2017-05-31 NOTE — Consult Note (Signed)
CONSULTATION NOTE   Patient Name: Ellen Marshall Date of Encounter: 06/13/2017 Cardiologist: Dr. Tamala Julian  Chief Complaint   Shortness of breath  Patient Profile   83 year old female patient of Dr. Tamala Julian, recently seen in the office, with a history of chronic angina, chronic diastolic heart failure, prior stroke and hospitalization for chest pain and pneumonia in September 2016.  She now presents from [facility] assisted living with generalized weakness for several weeks along with shortness of breath and sore throat.  HPI   Ellen Marshall is a 83 y.o. female who is being seen today for the evaluation of CHF and elevated troponin at the request of Dr. Tomi Bamberger. Ellen Marshall is a patient of Dr. Tamala Julian with chronic angina which was thought to be stable, HTN, diastolic CHF and prior stroke. She had a stress test in 2015 which was negative for ischemia. Echo the same year showed an LVEF of 60-65%. She now presents with several weeks of progressive dyspnea and weakness (just saw Dr. Tamala Julian on 10/30, without these complaints) -  Last night she had intense chest pain (10/10), center to left chest, non-radiating and extreme dyspnea, thought she might die - this prompted ER visit. Initial labs are remarkable for sodium of 119, normal creatinine, BNP 1295, troponin elevated at 1.36, leukocytosis at 23K with a left shift. CXR shows CHF with small effusions. EKG, personally reviewed, shows borderline inferior and high lateral ST elevation. She reports persistent chest pain, now 6/10 in intensity. On IV heparin.  PMHx   Past Medical History:  Diagnosis Date  . Chest pain   . Coronary artery disease   . GERD (gastroesophageal reflux disease)   . Hypertension   . Shortness of breath    laying down  . Stroke [facility])     Past Surgical History:  Procedure Laterality Date  . ABDOMINAL HYSTERECTOMY    . colonscopy    . ESOPHAGOGASTRODUODENOSCOPY    . ESOPHAGOGASTRODUODENOSCOPY  10/10/2011   Procedure:  ESOPHAGOGASTRODUODENOSCOPY (EGD);  Surgeon: Winfield Cunas., MD;  Location: [facility] ENDOSCOPY;  Service: Endoscopy;  Laterality: N/A;  with control of bleeding  . ESOPHAGOGASTRODUODENOSCOPY  10/12/2011   Procedure: ESOPHAGOGASTRODUODENOSCOPY (EGD);  Surgeon: Winfield Cunas., MD;  Location: [facility] ENDOSCOPY;  Service: Endoscopy;  Laterality: N/A;  . ESOPHAGOGASTRODUODENOSCOPY (EGD) WITH PROPOFOL N/A 12/12/2016   Procedure: ESOPHAGOGASTRODUODENOSCOPY (EGD) WITH PROPOFOL;  Surgeon: Wilford Corner, MD;  Location: [facility] ENDOSCOPY;  Service: Endoscopy;  Laterality: N/A;  . FLEXIBLE SIGMOIDOSCOPY  10/12/2011   Procedure: FLEXIBLE SIGMOIDOSCOPY;  Surgeon: Winfield Cunas., MD;  Location: [facility] ENDOSCOPY;  Service: Endoscopy;  Laterality: N/A;  . stretching of EGD      FAMHx   Family History  Problem Relation Age of Onset  . Pancreatic cancer Mother   . CAD Mother   . Heart attack Mother   . CAD Father   . Heart attack Father   . CAD Sister   . Hypertension Sister   . CAD Brother   . Heart attack Sister   . Heart attack Brother     SOCHx    reports that  has never smoked. she has never used smokeless tobacco. She reports that she does not drink alcohol or use drugs.  Outpatient Medications   No current facility-administered medications on file prior to encounter.    Current Outpatient Medications on File Prior to Encounter  Medication Sig Dispense Refill  . acetaminophen (TYLENOL) 650 MG CR tablet Take 1,300 mg by mouth 3 (three)  times daily as needed for pain.    Marland Kitchen ALPRAZolam (XANAX) 0.25 MG tablet Take 0.25 mg by mouth 2 (two) times daily. As needed for anxiety. Takes at 2pm and bedtime    . amLODipine (NORVASC) 5 MG tablet Take 5 mg by mouth daily.    Marland Kitchen aspirin EC 81 MG tablet Take 81 mg daily by mouth.    . clopidogrel (PLAVIX) 75 MG tablet Take 75 mg by mouth daily after lunch.     . escitalopram (LEXAPRO) 20 MG tablet Take 20 mg by mouth every morning.    . ferrous sulfate 325 (65  FE) MG tablet Take 325 mg by mouth 2 (two) times daily with a meal.    . folic acid (FOLVITE) 277 MCG tablet Take 800 mcg by mouth daily.    . furosemide (LASIX) 20 MG tablet Take 1 tablet by mouth every Monday, Wednesday and Friday 15 tablet 11  . isosorbide mononitrate (IMDUR) 120 MG 24 hr tablet TAKE 1 TABLET DAILY 90 tablet 3  . losartan (COZAAR) 100 MG tablet take one tablet (100 mg ) daily  1  . metoprolol (LOPRESSOR) 50 MG tablet Take 25 mg by mouth 2 (two) times daily.    . nitroGLYCERIN (NITROSTAT) 0.4 MG SL tablet Place 0.4 mg under the tongue every 5 (five) minutes as needed. As needed for chest pain.    . pantoprazole (PROTONIX) 40 MG tablet Take 1 tablet (40 mg total) by mouth 2 (two) times daily. 60 tablet 0  . RANEXA 500 MG 12 hr tablet TAKE 1 TABLET TWICE A DAY 180 tablet 2  . saccharomyces boulardii (FLORASTOR) 250 MG capsule Take 250 mg by mouth daily.     . timolol (TIMOPTIC-XR) 0.5 % ophthalmic gel-forming Place 1 drop into both eyes daily.    . travoprost, benzalkonium, (TRAVATAN) 0.004 % ophthalmic solution Place 1 drop into both eyes at bedtime.    . traZODone (DESYREL) 50 MG tablet Take 150 mg by mouth at bedtime.      Inpatient Medications    Scheduled Meds:   Continuous Infusions: . heparin 700 Units/hr (06/11/2017 1303)    PRN Meds: nitroGLYCERIN   ALLERGIES   Allergies  Allergen Reactions  . Adhesive [Tape] Other (See Comments)    Burning, Itching.  . Amoxicillin Diarrhea    Has patient had a PCN reaction causing immediate rash, facial/tongue/throat swelling, SOB or lightheadedness with hypotension: No Has patient had a PCN reaction causing severe rash involving mucus membranes or skin necrosis:No Did patient had a PCN reaction that required hospitalization:No Has patient had a PCN reaction occurring within the last 10 years: Yes If all of the above answers are "NO", then may proceed with Cephalosporin use.   . Pregabalin     Weakness, diarrhea   .  Simvastatin     Myalgias   . Sulfa Antibiotics Itching    ROS   Pertinent items noted in HPI and remainder of comprehensive ROS otherwise negative.  Vitals   Vitals:   06/12/2017 1245 06/10/2017 1315 05/22/2017 1345 06/04/2017 1400  BP: 128/66 133/70 (!) 142/72 133/69  Pulse: 84 82 88 85  Resp: (!) 26 (!) 32 (!) 28 20  Temp:      TempSrc:      SpO2: 97% 97% 96% 95%  Weight:      Height:       No intake or output data in the 24 hours ending 05/24/2017 1429 Filed Weights   05/22/2017 1203  Weight:  130 lb (59 kg)    Physical Exam   General appearance: alert, appears older than stated age, cachectic, mild distress and pale Neck: JVD - 5 cm above sternal notch, no carotid bruit and thyroid not enlarged, symmetric, no tenderness/mass/nodules Lungs: diminished breath sounds bibasilar and rales bilaterally Heart: regular rate and rhythm Abdomen: soft, mildly distended and TTP, no rebound or guarding Extremities: edema 1+ edema, ecchymoses, chronic venous stasis changes Pulses: 2+ and symmetric Skin: thin skin, ecchymoses Neurologic: Mental status: Alert, oriented, thought content appropriate Psych: Pleasant but appears dyspneic  Labs   Results for orders placed or performed during the hospital encounter of 06/04/2017 (from the past 48 hour(s))  Brain natriuretic peptide     Status: Abnormal   Collection Time: 06/12/2017 12:02 PM  Result Value Ref Range   B Natriuretic Peptide 1,295.7 (H) 0.0 - 100.0 pg/mL  CBC with Differential     Status: Abnormal   Collection Time: 05/26/2017 12:02 PM  Result Value Ref Range   WBC 23.3 (H) 4.0 - 10.5 K/uL   RBC 3.81 (L) 3.87 - 5.11 MIL/uL   Hemoglobin 12.7 12.0 - 15.0 g/dL   HCT 35.7 (L) 36.0 - 46.0 %   MCV 93.7 78.0 - 100.0 fL   MCH 33.3 26.0 - 34.0 pg   MCHC 35.6 30.0 - 36.0 g/dL   RDW 13.9 11.5 - 15.5 %   Platelets 512 (H) 150 - 400 K/uL   Neutrophils Relative % 90 %   Neutro Abs 20.9 (H) 1.7 - 7.7 K/uL   Lymphocytes Relative 4 %   Lymphs  Abs 0.9 0.7 - 4.0 K/uL   Monocytes Relative 6 %   Monocytes Absolute 1.5 (H) 0.1 - 1.0 K/uL   Eosinophils Relative 0 %   Eosinophils Absolute 0.1 0.0 - 0.7 K/uL   Basophils Relative 0 %   Basophils Absolute 0.0 0.0 - 0.1 K/uL  Comprehensive metabolic panel     Status: Abnormal   Collection Time: 06/13/2017 12:02 PM  Result Value Ref Range   Sodium 119 (LL) 135 - 145 mmol/L    Comment: CRITICAL RESULT CALLED TO, READ BACK BY AND VERIFIED WITH: M.QUICK RN [id number] [id number] A.QUIZON    Potassium 4.3 3.5 - 5.1 mmol/L   Chloride 86 (L) 101 - 111 mmol/L   CO2 22 22 - 32 mmol/L   Glucose, Bld 169 (H) 65 - 99 mg/dL   BUN 14 6 - 20 mg/dL   Creatinine, Ser 0.82 0.44 - 1.00 mg/dL   Calcium 8.9 8.9 - 10.3 mg/dL   Total Protein 6.7 6.5 - 8.1 g/dL   Albumin 3.6 3.5 - 5.0 g/dL   AST 27 15 - 41 U/L   ALT 14 14 - 54 U/L   Alkaline Phosphatase 58 38 - 126 U/L   Total Bilirubin 0.8 0.3 - 1.2 mg/dL   GFR calc non Af Amer >60 >60 mL/min   GFR calc Af Amer >60 >60 mL/min    Comment: (NOTE) The eGFR has been calculated using the CKD EPI equation. This calculation has not been validated in all clinical situations. eGFR's persistently <60 mL/min signify possible Chronic Kidney Disease.    Anion gap 11 5 - 15  I-stat troponin, ED     Status: Abnormal   Collection Time: 06/06/2017 12:24 PM  Result Value Ref Range   Troponin i, poc 1.36 (HH) 0.00 - 0.08 ng/mL   Comment NOTIFIED PHYSICIAN    Comment 3  Comment: Due to the release kinetics of cTnI, a negative result within the first hours of the onset of symptoms does not rule out myocardial infarction with certainty. If myocardial infarction is still suspected, repeat the test at appropriate intervals.     ECG   Sinus rhythm at 88, borderline inferior and high lateral ST segment elevation (<1 mm) - Personally Reviewed  Telemetry   Sinus rhythm - Personally Reviewed  Radiology   Dg Chest 2 View  Result Date: 05/23/2017 CLINICAL  DATA:  Worsening weakness and shortness of breath over the last week. EXAM: CHEST  2 VIEW COMPARISON:  12/11/2016 FINDINGS: Heart size is normal. Chronic aortic atherosclerosis. There is interstitial and alveolar edema with small amount of bilateral pleural fluid. Findings are most consistent with congestive heart failure. No evidence of focal consolidation or lobar collapse. No significant bone finding. IMPRESSION: Congestive heart failure with interstitial and early alveolar edema and small effusions. Electronically Signed   By: Nelson Chimes M.D.   On: 05/30/2017 12:37    Cardiac Studies   N/A  Impression   Principal Problem:   Acute congestive heart failure (HCC) Active Problems:   Hyponatremia with excess extracellular fluid volume   Weakness   NSTEMI (non-ST elevated myocardial infarction) ([place])   Recommendation   1. Mrs. Tirey is in acute congestive heart failure, suspicious for systolic CHF with what sounds like an unstable angina episode which was intense yesterday and now noted to have had NSTEMI by elevated troponin. She is volume overloaded and likely has a hypervolemic hyponatremia - sodium is 119. There is leukocytosis, but no fever or other obvious source of infection - may be a leukemoid reaction. Appropriately on IV heparin. Would add nitropaste for chest pain and pulmonary vasodilitation. Check 2D echo and cycle cardiac enzymes. We should admit her to stepdown here for close observation and diuresis. May ultimately consider cath, however, could transfer to [facility] if we go that direction.  Thanks for the consultation. Briefly d/w Dr. [place] Pink who was called to admit the patient. Will follow closely with the hospitalist service.  Time Spent Directly with Patient:  I have spent a total of 45 minutes with the patient reviewing hospital notes, telemetry, EKGs, labs and examining the patient as well as establishing an assessment and plan that was discussed personally with the  patient. > 50% of time was spent in direct patient care.  Length of Stay:  LOS: 0 days   Pixie Casino, MD, [facility] [place] Director of the [facility]  [facility] Attending Cardiologist  Direct Dial: [phone]  Fax: [phone]  Website:  [email or web address] Wence 06/01/2017, 2:29 PM

## 2017-05-31 NOTE — ED Notes (Signed)
ED Provider at bedside. 

## 2017-05-31 NOTE — Progress Notes (Signed)
ANTICOAGULATION CONSULT NOTE - Initial Consult  Pharmacy Consult for heparin Indication: chest pain/ACS  Allergies  Allergen Reactions  . Adhesive [Tape] Other (See Comments)    Burning, Itching.  . Amoxicillin Diarrhea  . Pregabalin     Weakness, diarrhea   . Simvastatin     Myalgias   . Sulfa Antibiotics Itching    Patient Measurements: Height: 5' 3.5" (161.3 cm) Weight: 130 lb (59 kg) IBW/kg (Calculated) : 53.55 Heparin Dosing Weight: 59kg  Vital Signs: Temp: 99.7 F (37.6 C) (11/15 1211) Temp Source: Rectal (11/15 1211) BP: 124/65 (11/15 1238) Pulse Rate: 84 (11/15 1238)  Labs: Recent Labs    05/20/2017 1202  HGB 12.7  HCT 35.7*  PLT 512*    CrCl cannot be calculated (Patient's most recent lab result is older than the maximum 21 days allowed.).   Medical History: Past Medical History:  Diagnosis Date  . Chest pain   . Coronary artery disease   . GERD (gastroesophageal reflux disease)   . Hypertension   . Shortness of breath    laying down  . Stroke [facility])     Assessment: 59 YOF presents with weakness, sore throat, and shortness of breath.  Patient with h/o chronic angina, HTN, diastolic HF, CVA, GIB.  Pharmacy asked to dose heparin gtt for rule out ACS as troponin elevated  Today, 06/11/2017  Does not appear to be on anticoagulation prior to admission.   Renal: SCR WNL  CBC: Hgb and pltc acceptable for starting heparin gtt  Goal of Therapy:  Heparin level 0.3-0.7 units/ml Monitor platelets by anticoagulation protocol: Yes   Plan:   Heparin 3000 units x 1 then heparin 700 units/hr  Check 8h heparin level  Check daily heparin level and CBC  Monitor for bleeding  Doreene Eland, PharmD, BCPS.   Pager: [phone] 06/09/2017 12:55 PM

## 2017-05-31 NOTE — ED Notes (Signed)
Patient transported to X-ray 

## 2017-05-31 NOTE — Progress Notes (Signed)
ANTICOAGULATION CONSULT NOTE - f/u Consult  Pharmacy Consult for heparin Indication: chest pain/ACS  Allergies  Allergen Reactions  . Adhesive [Tape] Other (See Comments)    Burning, Itching.  . Amoxicillin Diarrhea    Has patient had a PCN reaction causing immediate rash, facial/tongue/throat swelling, SOB or lightheadedness with hypotension: No Has patient had a PCN reaction causing severe rash involving mucus membranes or skin necrosis:No Did patient had a PCN reaction that required hospitalization:No Has patient had a PCN reaction occurring within the last 10 years: Yes If all of the above answers are "NO", then may proceed with Cephalosporin use.   . Pregabalin     Weakness, diarrhea   . Simvastatin     Myalgias   . Sulfa Antibiotics Itching    Patient Measurements: Height: 5' 3.5" (161.3 cm) Weight: 130 lb (59 kg) IBW/kg (Calculated) : 53.55 Heparin Dosing Weight: 59kg  Vital Signs: Temp: 97.6 F (36.4 C) (11/15 2045) Temp Source: Oral (11/15 2045) BP: 128/64 (11/15 2215) Pulse Rate: 102 (11/15 2215)  Labs: Recent Labs    06/07/2017 1202 06/03/2017 1729 05/29/2017 2139  HGB 12.7  --   --   HCT 35.7*  --   --   PLT 512*  --   --   HEPARINUNFRC  --   --  0.37  CREATININE 0.82  --   --   TROPONINI  --  1.40*  --     Estimated Creatinine Clearance: 39.4 mL/min (by C-G formula based on SCr of 0.82 mg/dL).   Medical History: Past Medical History:  Diagnosis Date  . Chest pain   . Coronary artery disease   . GERD (gastroesophageal reflux disease)   . Hypertension   . Shortness of breath    laying down  . Stroke [facility])     Assessment: 43 YOF presents with weakness, sore throat, and shortness of breath.  Patient with h/o chronic angina, HTN, diastolic HF, CVA, GIB.  Pharmacy asked to dose heparin gtt for rule out ACS as troponin elevated  Today, 05/18/2017  Does not appear to be on anticoagulation prior to admission.   Renal: SCR WNL  CBC: Hgb and pltc  acceptable for starting heparin gtt  2139 HL=0.37 at goal, no infusion or bleeding issues per RN  Goal of Therapy:  Heparin level 0.3-0.7 units/ml Monitor platelets by anticoagulation protocol: Yes   Plan:   Continue heparin drip at 700 units/hr  Check daily heparin level and CBC  Monitor for bleeding   Dorrene German 05/30/2017 10:48 PM

## 2017-05-31 NOTE — ED Notes (Signed)
Hospitalist at bedside 

## 2017-05-31 NOTE — ED Triage Notes (Signed)
Per EMS, patient from [facility], c/o generalized weakness x2 weeks, worsening last night after swallowing a mouth full of biotine. Also c/o sore throat and SOB since that time.   20g L AC  BP 110/62 HR 82 O2 98% on 3L

## 2017-06-01 ENCOUNTER — Inpatient Hospital Stay (HOSPITAL_COMMUNITY): Payer: Medicare Other

## 2017-06-01 ENCOUNTER — Other Ambulatory Visit: Payer: Self-pay | Admitting: Physician Assistant

## 2017-06-01 DIAGNOSIS — I340 Nonrheumatic mitral (valve) insufficiency: Secondary | ICD-10-CM

## 2017-06-01 LAB — ECHOCARDIOGRAM COMPLETE
HEIGHTINCHES: 63.5 in
WEIGHTICAEL: 2194.02 [oz_av]

## 2017-06-01 LAB — HEPARIN LEVEL (UNFRACTIONATED)
HEPARIN UNFRACTIONATED: 0.3 [IU]/mL (ref 0.30–0.70)
HEPARIN UNFRACTIONATED: 0.22 [IU]/mL — AB (ref 0.30–0.70)

## 2017-06-01 LAB — TROPONIN I
TROPONIN I: 1.41 ng/mL — AB (ref <0.03)
Troponin I: 1.2 ng/mL (ref <0.03)

## 2017-06-01 LAB — BASIC METABOLIC PANEL
Anion gap: 7 (ref 5–15)
BUN: 13 mg/dL (ref 6–20)
CO2: 27 mmol/L (ref 22–32)
Calcium: 8.5 mg/dL — ABNORMAL LOW (ref 8.9–10.3)
Chloride: 87 mmol/L — ABNORMAL LOW (ref 101–111)
Creatinine, Ser: 0.95 mg/dL (ref 0.44–1.00)
GFR calc non Af Amer: 52 mL/min — ABNORMAL LOW (ref >60)
GFR, EST AFRICAN AMERICAN: 60 mL/min — AB (ref >60)
Glucose, Bld: 151 mg/dL — ABNORMAL HIGH (ref 65–99)
Potassium: 4 mmol/L (ref 3.5–5.1)
SODIUM: 121 mmol/L — AB (ref 135–145)

## 2017-06-01 LAB — CBC
HCT: 33.5 % — ABNORMAL LOW (ref 36.0–46.0)
Hemoglobin: 11.6 g/dL — ABNORMAL LOW (ref 12.0–15.0)
MCH: 32.5 pg (ref 26.0–34.0)
MCHC: 34.6 g/dL (ref 30.0–36.0)
MCV: 93.8 fL (ref 78.0–100.0)
Platelets: 436 10*3/uL — ABNORMAL HIGH (ref 150–400)
RBC: 3.57 MIL/uL — ABNORMAL LOW (ref 3.87–5.11)
RDW: 14.1 % (ref 11.5–15.5)
WBC: 21.8 10*3/uL — ABNORMAL HIGH (ref 4.0–10.5)

## 2017-06-01 MED ORDER — HEPARIN (PORCINE) IN NACL 100-0.45 UNIT/ML-% IJ SOLN
1000.0000 [IU]/h | INTRAMUSCULAR | Status: DC
  Administered 2017-06-01 – 2017-06-02 (×2): 1000 [IU]/h via INTRAVENOUS
  Filled 2017-06-01: qty 250

## 2017-06-01 MED ORDER — HEPARIN (PORCINE) IN NACL 100-0.45 UNIT/ML-% IJ SOLN
800.0000 [IU]/h | INTRAMUSCULAR | Status: DC
  Administered 2017-06-01: 800 [IU]/h via INTRAVENOUS
  Filled 2017-06-01: qty 250

## 2017-06-01 MED ORDER — HEPARIN BOLUS VIA INFUSION
2000.0000 [IU] | Freq: Once | INTRAVENOUS | Status: AC
  Administered 2017-06-01: 2000 [IU] via INTRAVENOUS
  Filled 2017-06-01: qty 2000

## 2017-06-01 MED ORDER — MORPHINE SULFATE (PF) 4 MG/ML IV SOLN
2.0000 mg | INTRAVENOUS | Status: DC | PRN
  Administered 2017-06-01 – 2017-06-03 (×6): 2 mg via INTRAVENOUS
  Filled 2017-06-01 (×6): qty 1

## 2017-06-01 NOTE — Progress Notes (Signed)
DAILY PROGRESS NOTE   Patient Name: Ellen Marshall Date of Encounter: 06/01/2017  Chief Complaint   Neck pain  Patient Profile   83 year old female patient of Dr. Tamala Julian, recently seen in the office, with a history of chronic angina, chronic diastolic heart failure, prior stroke and hospitalization for chest pain and pneumonia in September 2016.  She now presents from [facility] assisted living with generalized weakness for several weeks along with shortness of breath and sore throat.  Subjective   Breathing unchanged today. Main complaint is neck/throat pain - apparently this has been going on for years.. Sodium has improved from 119 to 121. Troponin is flatly elevated between 1.2-1.4, more consistent with CHF. Echo is pending. Leukocytosis is improved today - not on antibiotics. No significant recorded diuresis overnight- weight unchanged, but sodium improving.  Objective   Vitals:   06/01/17 0800 06/01/17 0930 06/01/17 1000 06/01/17 1030  BP: (!) 151/61 (!) 144/65 138/60 130/68  Pulse: (!) 108 (!) 113 (!) 113 (!) 111  Resp: (!) 33 (!) 31 (!) 36 (!) 21  Temp: 98.5 F (36.9 C)     TempSrc: Oral     SpO2: 96% 95% 96% 96%  Weight:      Height:        Intake/Output Summary (Last 24 hours) at 06/01/2017 1053 Last data filed at 06/01/2017 1000 Gross per 24 hour  Intake 456.59 ml  Output 50 ml  Net 406.59 ml   Filed Weights   06/10/2017 1203 06/01/17 0402 06/01/17 0630  Weight: 130 lb (59 kg) 136 lb 14.5 oz (62.1 kg) 137 lb 2 oz (62.2 kg)    Physical Exam   General appearance: alert, appears older than stated age and mild distress Neck: JVD - 4 cm above sternal notch, no carotid bruit and thyroid not enlarged, symmetric, no tenderness/mass/nodules Lungs: diminished breath sounds bilaterally Heart: regular rate and rhythm Abdomen: soft, non-tender; bowel sounds normal; no masses,  no organomegaly Extremities: extremities normal, atraumatic, no cyanosis or  edema Neurologic: Grossly normal  Inpatient Medications    Scheduled Meds: . aspirin EC  81 mg Oral Daily  . clopidogrel  75 mg Oral QPC lunch  . escitalopram  20 mg Oral q morning - 38B  . folic acid  1 mg Oral Daily  . furosemide  40 mg Intravenous BID  . latanoprost  1 drop Both Eyes QHS  . losartan  50 mg Oral Daily  . metoprolol tartrate  25 mg Oral BID  . pantoprazole  40 mg Oral BID  . ranolazine  500 mg Oral BID  . saccharomyces boulardii  250 mg Oral Daily  . sodium chloride flush  3 mL Intravenous Q12H  . timolol  1 drop Both Eyes Daily    Continuous Infusions: . sodium chloride    . heparin 800 Units/hr (06/01/17 1000)  . nitroGLYCERIN 70 mcg/min (06/01/17 1043)    PRN Meds: sodium chloride, acetaminophen, ALPRAZolam, ondansetron (ZOFRAN) IV, sodium chloride flush, traZODone   Labs   Results for orders placed or performed during the hospital encounter of 05/20/2017 (from the past 48 hour(s))  Brain natriuretic peptide     Status: Abnormal   Collection Time: 05/19/2017 12:02 PM  Result Value Ref Range   B Natriuretic Peptide 1,295.7 (H) 0.0 - 100.0 pg/mL  CBC with Differential     Status: Abnormal   Collection Time: 05/20/2017 12:02 PM  Result Value Ref Range   WBC 23.3 (H) 4.0 - 10.5 K/uL  RBC 3.81 (L) 3.87 - 5.11 MIL/uL   Hemoglobin 12.7 12.0 - 15.0 g/dL   HCT 35.7 (L) 36.0 - 46.0 %   MCV 93.7 78.0 - 100.0 fL   MCH 33.3 26.0 - 34.0 pg   MCHC 35.6 30.0 - 36.0 g/dL   RDW 13.9 11.5 - 15.5 %   Platelets 512 (H) 150 - 400 K/uL   Neutrophils Relative % 90 %   Neutro Abs 20.9 (H) 1.7 - 7.7 K/uL   Lymphocytes Relative 4 %   Lymphs Abs 0.9 0.7 - 4.0 K/uL   Monocytes Relative 6 %   Monocytes Absolute 1.5 (H) 0.1 - 1.0 K/uL   Eosinophils Relative 0 %   Eosinophils Absolute 0.1 0.0 - 0.7 K/uL   Basophils Relative 0 %   Basophils Absolute 0.0 0.0 - 0.1 K/uL  Comprehensive metabolic panel     Status: Abnormal   Collection Time: 06/15/2017 12:02 PM  Result Value Ref  Range   Sodium 119 (LL) 135 - 145 mmol/L    Comment: CRITICAL RESULT CALLED TO, READ BACK BY AND VERIFIED WITH: M.QUICK RN [id number] A.QUIZON    Potassium 4.3 3.5 - 5.1 mmol/L   Chloride 86 (L) 101 - 111 mmol/L   CO2 22 22 - 32 mmol/L   Glucose, Bld 169 (H) 65 - 99 mg/dL   BUN 14 6 - 20 mg/dL   Creatinine, Ser 0.82 0.44 - 1.00 mg/dL   Calcium 8.9 8.9 - 10.3 mg/dL   Total Protein 6.7 6.5 - 8.1 g/dL   Albumin 3.6 3.5 - 5.0 g/dL   AST 27 15 - 41 U/L   ALT 14 14 - 54 U/L   Alkaline Phosphatase 58 38 - 126 U/L   Total Bilirubin 0.8 0.3 - 1.2 mg/dL   GFR calc non Af Amer >60 >60 mL/min   GFR calc Af Amer >60 >60 mL/min    Comment: (NOTE) The eGFR has been calculated using the CKD EPI equation. This calculation has not been validated in all clinical situations. eGFR's persistently <60 mL/min signify possible Chronic Kidney Disease.    Anion gap 11 5 - 15  I-stat troponin, ED     Status: Abnormal   Collection Time: 05/25/2017 12:24 PM  Result Value Ref Range   Troponin i, poc 1.36 (HH) 0.00 - 0.08 ng/mL   Comment NOTIFIED PHYSICIAN    Comment 3            Comment: Due to the release kinetics of cTnI, a negative result within the first hours of the onset of symptoms does not rule out myocardial infarction with certainty. If myocardial infarction is still suspected, repeat the test at appropriate intervals.   Troponin I     Status: Abnormal   Collection Time: 05/19/2017  5:29 PM  Result Value Ref Range   Troponin I 1.40 (HH) <0.03 ng/mL    Comment: CRITICAL RESULT CALLED TO, READ BACK BY AND VERIFIED WITH: HODGES,W. RN '@[id number]'  ON 11.15.18 BY COHEN,K   Urinalysis, Routine w reflex microscopic     Status: None   Collection Time: 05/26/2017  5:43 PM  Result Value Ref Range   Color, Urine YELLOW YELLOW   APPearance CLEAR CLEAR   Specific Gravity, Urine 1.008 1.005 - 1.030   pH 7.0 5.0 - 8.0   Glucose, UA NEGATIVE NEGATIVE mg/dL   Hgb urine dipstick NEGATIVE NEGATIVE   Bilirubin  Urine NEGATIVE NEGATIVE   Ketones, ur NEGATIVE NEGATIVE mg/dL   Protein, ur NEGATIVE  NEGATIVE mg/dL   Nitrite NEGATIVE NEGATIVE   Leukocytes, UA NEGATIVE NEGATIVE  MRSA PCR Screening     Status: None   Collection Time: 05/18/2017  5:48 PM  Result Value Ref Range   MRSA by PCR NEGATIVE NEGATIVE    Comment:        The GeneXpert MRSA Assay (FDA approved for NASAL specimens only), is one component of a comprehensive MRSA colonization surveillance program. It is not intended to diagnose MRSA infection nor to guide or monitor treatment for MRSA infections.   Heparin level (unfractionated)     Status: None   Collection Time: 06/09/2017  9:39 PM  Result Value Ref Range   Heparin Unfractionated 0.37 0.30 - 0.70 IU/mL    Comment:        IF HEPARIN RESULTS ARE BELOW EXPECTED VALUES, AND PATIENT DOSAGE HAS BEEN CONFIRMED, SUGGEST FOLLOW UP TESTING OF ANTITHROMBIN III LEVELS.   Troponin I     Status: Abnormal   Collection Time: 05/19/2017 11:58 PM  Result Value Ref Range   Troponin I 1.41 (HH) <0.03 ng/mL    Comment: CRITICAL VALUE NOTED.  VALUE IS CONSISTENT WITH PREVIOUSLY REPORTED AND CALLED VALUE.  CBC     Status: Abnormal   Collection Time: 06/01/17  5:34 AM  Result Value Ref Range   WBC 21.8 (H) 4.0 - 10.5 K/uL   RBC 3.57 (L) 3.87 - 5.11 MIL/uL   Hemoglobin 11.6 (L) 12.0 - 15.0 g/dL   HCT 33.5 (L) 36.0 - 46.0 %   MCV 93.8 78.0 - 100.0 fL   MCH 32.5 26.0 - 34.0 pg   MCHC 34.6 30.0 - 36.0 g/dL   RDW 14.1 11.5 - 15.5 %   Platelets 436 (H) 150 - 400 K/uL  Basic metabolic panel     Status: Abnormal   Collection Time: 06/01/17  5:34 AM  Result Value Ref Range   Sodium 121 (L) 135 - 145 mmol/L   Potassium 4.0 3.5 - 5.1 mmol/L   Chloride 87 (L) 101 - 111 mmol/L   CO2 27 22 - 32 mmol/L   Glucose, Bld 151 (H) 65 - 99 mg/dL   BUN 13 6 - 20 mg/dL   Creatinine, Ser 0.95 0.44 - 1.00 mg/dL   Calcium 8.5 (L) 8.9 - 10.3 mg/dL   GFR calc non Af Amer 52 (L) >60 mL/min   GFR calc Af Amer  60 (L) >60 mL/min    Comment: (NOTE) The eGFR has been calculated using the CKD EPI equation. This calculation has not been validated in all clinical situations. eGFR's persistently <60 mL/min signify possible Chronic Kidney Disease.    Anion gap 7 5 - 15  Heparin level (unfractionated)     Status: None   Collection Time: 06/01/17  5:34 AM  Result Value Ref Range   Heparin Unfractionated 0.30 0.30 - 0.70 IU/mL    Comment:        IF HEPARIN RESULTS ARE BELOW EXPECTED VALUES, AND PATIENT DOSAGE HAS BEEN CONFIRMED, SUGGEST FOLLOW UP TESTING OF ANTITHROMBIN III LEVELS.   Troponin I     Status: Abnormal   Collection Time: 06/01/17  5:34 AM  Result Value Ref Range   Troponin I 1.20 (HH) <0.03 ng/mL    Comment: CRITICAL VALUE NOTED.  VALUE IS CONSISTENT WITH PREVIOUSLY REPORTED AND CALLED VALUE.    ECG   Sinus with PVC's, anterolateral TWI's (new since yesterday)  - Personally Reviewed  Telemetry   Sinus tachycardia with PVC's - Personally Reviewed  Radiology    Dg Chest 2 View  Result Date: 05/20/2017 CLINICAL DATA:  Worsening weakness and shortness of breath over the last week. EXAM: CHEST  2 VIEW COMPARISON:  12/11/2016 FINDINGS: Heart size is normal. Chronic aortic atherosclerosis. There is interstitial and alveolar edema with small amount of bilateral pleural fluid. Findings are most consistent with congestive heart failure. No evidence of focal consolidation or lobar collapse. No significant bone finding. IMPRESSION: Congestive heart failure with interstitial and early alveolar edema and small effusions. Electronically Signed   By: Nelson Chimes M.D.   On: 05/24/2017 12:37    Cardiac Studies   N/A  Assessment   1. Principal Problem: 2.   Acute congestive heart failure ([place]) 3. Active Problems: 4.   Hyponatremia with excess extracellular fluid volume 5.   Weakness 6.   NSTEMI (non-ST elevated myocardial infarction) ([place]) 7.   Acute CHF (congestive heart failure)  ([place]) 8.   Plan   1. No real improvement in symptoms overnight-  Not complaining of chest pain, rather chronic neck pain, difficulty swallowing. On nitro gtts and heparin - troponin has been flatly elevated. New EKG changes today- specifically deep anterolateral TWI's. Last troponin was down-trending. Will order additional troponin and EKG tomorrow. Increase diuresis today. Await echo - may need to consider cath next week, although she is frail and may not be the ideal candidate.  Time Spent Directly with Patient:  I have spent a total of 25 minutes with the patient reviewing hospital notes, telemetry, EKGs, labs and examining the patient as well as establishing an assessment and plan that was discussed personally with the patient. > 50% of time was spent in direct patient care.  Length of Stay:  LOS: 1 day   Pixie Casino, MD, [facility], [place] Director of the Advanced Lipid Disorders &  Cardiovascular [facility] Reduction Clinic Attending Cardiologist  Direct Dial: [phone]  Fax: [phone]  Website:  [email or web address] 06/01/2017, 10:53 AM

## 2017-06-01 NOTE — Progress Notes (Addendum)
ANTICOAGULATION CONSULT NOTE - Follow Up Consult  Pharmacy Consult for heparin Indication: chest pain/ACS  Allergies  Allergen Reactions  . Adhesive [Tape] Other (See Comments)    Burning, Itching.  . Amoxicillin Diarrhea    Has patient had a PCN reaction causing immediate rash, facial/tongue/throat swelling, SOB or lightheadedness with hypotension: No Has patient had a PCN reaction causing severe rash involving mucus membranes or skin necrosis:No Did patient had a PCN reaction that required hospitalization:No Has patient had a PCN reaction occurring within the last 10 years: Yes If all of the above answers are "NO", then may proceed with Cephalosporin use.   . Pregabalin     Weakness, diarrhea   . Simvastatin     Myalgias   . Sulfa Antibiotics Itching    Patient Measurements: Height: 5' 3.5" (161.3 cm) Weight: 137 lb 2 oz (62.2 kg) IBW/kg (Calculated) : 53.55 Heparin Dosing Weight: 59kg  Vital Signs: Temp: 98.5 F (36.9 C) (11/16 0800) Temp Source: Oral (11/16 0800) BP: 136/62 (11/16 0700) Pulse Rate: 101 (11/16 0700)  Labs: Recent Labs    05/18/2017 1202 06/06/2017 1729 05/28/2017 2139 06/03/2017 2358 06/01/17 0534  HGB 12.7  --   --   --  11.6*  HCT 35.7*  --   --   --  33.5*  PLT 512*  --   --   --  436*  HEPARINUNFRC  --   --  0.37  --  0.30  CREATININE 0.82  --   --   --  0.95  TROPONINI  --  1.40*  --  1.41* 1.20*    Estimated Creatinine Clearance: 34 mL/min (by C-G formula based on SCr of 0.95 mg/dL).   Medical History: Past Medical History:  Diagnosis Date  . Chest pain   . Coronary artery disease   . GERD (gastroesophageal reflux disease)   . Hypertension   . Shortness of breath    laying down  . Stroke [facility])     Assessment: 26 YOF presents with weakness, sore throat, and shortness of breath.  Patient with h/o chronic angina, HTN, diastolic HF, CVA, GIB.  Pharmacy asked to dose heparin gtt for rule out ACS as troponin elevated.  Does not appear  to be on anticoagulation prior to admission.   Today, 06/01/2017  Heparin level 0.30 units/ml - at low end of therapeutic range with infusion at 700 units/hr  Renal: SCR WNL  CBC: Hgb decreased slightly, platelets elevated  No bleeding/complications reported.   Cards following and may consider transfer to [facility] for cath  Goal of Therapy:  Heparin level 0.3-0.7 units/ml Monitor platelets by anticoagulation protocol: Yes   Plan:   Increase heparin drip to 800 units/hr to avoid subtherapeutic level if continues to trend down  Check heparin level in 8 hours.  Check daily heparin level and CBC  Monitor for bleeding   Hershal Coria 06/01/2017 8:06 AM

## 2017-06-01 NOTE — Progress Notes (Signed)
EKG done per MD order. Hard copy placed in shadow chart

## 2017-06-01 NOTE — Progress Notes (Signed)
  Echocardiogram 2D Echocardiogram has been performed.  Tresa Res 06/01/2017, 2:24 PM

## 2017-06-01 NOTE — Care Management Note (Signed)
Case Management Note  Patient Details  Name: Ellen Marshall MRN: [id number] Date of Birth: 12/07/1925  Subjective/Objective:                  chf and plueral effusion/temp and elevated wbc  Action/Plan: Date: June 01, 2017 Velva Harman, BSN, [place], [place] Chart and notes review for patient progress and needs. Will follow for case management and discharge needs. Lives at [place] Next review date: [id number]  Expected Discharge Date:  (unknown)               Expected Discharge Plan:  Assisted Living / Rest Home  In-House Referral:  Clinical Social Work  Discharge planning Services  CM Consult  Post Acute Care Choice:    Choice offered to:     DME Arranged:    DME Agency:     HH Arranged:    [place] Agency:     Status of Service:  In process, will continue to follow  If discussed at Long Length of Stay Meetings, dates discussed:    Additional Comments:  Leeroy Cha, RN 06/01/2017, 7:49 AM

## 2017-06-01 NOTE — Progress Notes (Signed)
Pharmacy - IV heparin  Assessment:    Please see note from Hershal Coria, PharmD earlier today for full details.  Briefly, 83 y.o. female on IV heparin for NSTEMI.   Recently increased infusion rate for borderline low heparin level with downward trend  Most recent heparin level now subtherapeutic at 0.22 on 800 units/hr  Plan:  Heparin 2000 units IV bolus x 1  Increase heparin IV infusion to 1000 units/hr  Check heparin level 8 hrs after rate change  Daily CBC, daily heparin level once stable  Monitor for signs of bleeding or thrombosis  Reuel Boom, PharmD, BCPS Pager: [phone] 06/01/2017, 2:43 PM

## 2017-06-01 NOTE — Progress Notes (Signed)
OT Cancellation Note  Patient Details Name: Ellen Marshall MRN: [id number] DOB: March 12, 1926   Cancelled Treatment:    Reason Eval/Treat Not Completed: Medical issues which prohibited therapy. Pt with a critical troponin.  Will check over weekend or Monday.  [name] 06/01/2017, 7:28 AM  Lesle Chris, OTR/L [phone] 06/01/2017

## 2017-06-01 NOTE — Progress Notes (Signed)
PT Cancellation Note  Patient Details Name: Ellen Marshall MRN: [id number] DOB: [birth year]   Cancelled Treatment:    Reason Eval/Treat Not Completed: Medical issues which prohibited therapy(pt not ready for PT today per RN, will follow. )   [name] 06/01/2017, 10:22 AM [phone]

## 2017-06-01 NOTE — Progress Notes (Signed)
CRITICAL VALUE ALERT  Critical Value:  troponin  Date & Time Notied:  06/01/17 00:57 Provider Notified: Dr Hal Hope  Orders Received/Actions taken: interventions already in place. Next troponin at 0600

## 2017-06-01 NOTE — Progress Notes (Signed)
PROGRESS NOTE  Zhara Gieske  UKG:[id number] DOB: [birth year] DOA: 06/07/2017 PCP: Josetta Huddle, MD  Outpatient Specialists: Cardiology, Dr. Tamala Julian Brief Narrative: Ellen Marshall is a 83 y.o. female with a past medical history of chronic angina, chronic diastolic congestive heart failure, prior stroke who lives in an assisted living facility and presented to the hospital complaining of shortness of breath ongoing for the past 5 days.  Patient is accompanied by her son and daughter-in-law.  Patient is a poor historian.  Apparently she developed chest pain last night which kept her up all night.  She took 2 nitroglycerin without much relief.  This morning she and her family decided to seek attention.  Continues to have chest pain rating about 5 out of 10 in intensity.  Some nausea but no vomiting.  She does not know if she has leg swelling.  Denies any dizziness lightheadedness or palpitations.  In the emergency department evaluation revealed positive troponin and a chest x-ray showed pulmonary edema.  She was also noted to have hyponatremia.  She will need hospitalization for further management.  Assessment & Plan: Principal Problem:   Acute congestive heart failure (HCC) Active Problems:   Hyponatremia with excess extracellular fluid volume   Weakness   NSTEMI (non-ST elevated myocardial infarction) (HCC)   Acute CHF (congestive heart failure) (HCC)  Acute on chronic diastolic CHF: Based on known echocardiogram findings. Suspect there may be new systolic component.  - Echocardiogram pending - Augment diuresis: lasix 40mg  IV BID - Monitor daily weights and strict I/O - Continue ARB, BB  NSTEMI and history of CAD: Flat troponin trend, with TWI's anterolaterally  - Continue ASA, plavix, metoprolol, ranexa, IV NTG, prn oxygen and prn morphine - Further management per cardiology. Considering catheterization.   Hypervolemic hyponatremia: Due to CHF. Stable/modestly improving.  - Monitor with  diuresis.   Leukocytosis: Improving off antibiotics without nidus of infection noted.  - Monitor  History of CVA: No new deficits - Continue ASA, plavix  HTN: Chronic, stable - Continue medications as above.  Gastritis:  - Continue PPI  DVT prophylaxis: IV heparin Code Status: DNR Family Communication: Son at bedside Disposition Plan: Continue SDU level of care  Consultants:   Cardiology  Procedures:   Echocardiogram ordered  Antimicrobials:  None   Subjective: Tells me she has midchest pain that radiates to the left and dyspnea with any exertion at all which is new. Legs more swollen than usual.   Objective: Vitals:   06/01/17 0930 06/01/17 1000 06/01/17 1030 06/01/17 1100  BP: (!) 144/65 138/60 130/68 123/64  Pulse: (!) 113 (!) 113 (!) 111 (!) 105  Resp: (!) 31 (!) 36 (!) 21 (!) 36  Temp:      TempSrc:      SpO2: 95% 96% 96% 96%  Weight:      Height:        Intake/Output Summary (Last 24 hours) at 06/01/2017 1144 Last data filed at 06/01/2017 1100 Gross per 24 hour  Intake 484.52 ml  Output 50 ml  Net 434.52 ml   Filed Weights   06/12/2017 1203 06/01/17 0402 06/01/17 0630  Weight: 59 kg (130 lb) 62.1 kg (136 lb 14.5 oz) 62.2 kg (137 lb 2 oz)    Gen: Elderly female in no acute distress  Pulm: Mildly labored, diminished breath sounds globally with crackles noted.   CV: Regular rate and rhythm. No murmur, rub, or gallop. + JVD, 1+ pedal edema. GI: Abdomen soft, non-tender, non-distended, with normoactive bowel sounds.  No organomegaly or masses felt. Ext: Warm, no deformities Skin: No rashes, lesions no ulcers Neuro: Alert and oriented. No focal neurological deficits. Psych: Judgement and insight appear normal. Mood & affect appropriate.   Data Reviewed: I have personally reviewed following labs and imaging studies  CBC: Recent Labs  Lab 06/14/2017 1202 06/01/17 0534  WBC 23.3* 21.8*  NEUTROABS 20.9*  --   HGB 12.7 11.6*  HCT 35.7* 33.5*  MCV  93.7 93.8  PLT 512* 258*   Basic Metabolic Panel: Recent Labs  Lab 06/15/2017 1202 06/01/17 0534  NA 119* 121*  K 4.3 4.0  CL 86* 87*  CO2 22 27  GLUCOSE 169* 151*  BUN 14 13  CREATININE 0.82 0.95  CALCIUM 8.9 8.5*   GFR: Estimated Creatinine Clearance: 34 mL/min (by C-G formula based on SCr of 0.95 mg/dL). Liver Function Tests: Recent Labs  Lab 06/04/2017 1202  AST 27  ALT 14  ALKPHOS 58  BILITOT 0.8  PROT 6.7  ALBUMIN 3.6   No results for input(s): LIPASE, AMYLASE in the last 168 hours. No results for input(s): AMMONIA in the last 168 hours. Coagulation Profile: No results for input(s): INR, PROTIME in the last 168 hours. Cardiac Enzymes: Recent Labs  Lab 05/22/2017 1729 05/23/2017 2358 06/01/17 0534  TROPONINI 1.40* 1.41* 1.20*   BNP (last 3 results) No results for input(s): PROBNP in the last 8760 hours. HbA1C: No results for input(s): HGBA1C in the last 72 hours. CBG: No results for input(s): GLUCAP in the last 168 hours. Lipid Profile: No results for input(s): CHOL, HDL, LDLCALC, TRIG, CHOLHDL, LDLDIRECT in the last 72 hours. Thyroid Function Tests: No results for input(s): TSH, T4TOTAL, FREET4, T3FREE, THYROIDAB in the last 72 hours. Anemia Panel: No results for input(s): VITAMINB12, FOLATE, FERRITIN, TIBC, IRON, RETICCTPCT in the last 72 hours. Urine analysis:    Component Value Date/Time   COLORURINE YELLOW 06/15/2017 1743   APPEARANCEUR CLEAR 05/28/2017 1743   LABSPEC 1.008 05/25/2017 1743   PHURINE 7.0 05/29/2017 1743   GLUCOSEU NEGATIVE 06/06/2017 1743   GLUCOSEU NEGATIVE 10/13/2013 1518   HGBUR NEGATIVE 06/10/2017 1743   BILIRUBINUR NEGATIVE 05/18/2017 1743   KETONESUR NEGATIVE 06/04/2017 1743   PROTEINUR NEGATIVE 06/01/2017 1743   UROBILINOGEN 0.2 07/30/2014 0730   NITRITE NEGATIVE 05/20/2017 1743   LEUKOCYTESUR NEGATIVE 06/12/2017 1743   Recent Results (from the past 240 hour(s))  MRSA PCR Screening     Status: None   Collection Time:  06/06/2017  5:48 PM  Result Value Ref Range Status   MRSA by PCR NEGATIVE NEGATIVE Final    Comment:        The GeneXpert MRSA Assay (FDA approved for NASAL specimens only), is one component of a comprehensive MRSA colonization surveillance program. It is not intended to diagnose MRSA infection nor to guide or monitor treatment for MRSA infections.       Radiology Studies: Dg Chest 2 View  Result Date: 05/29/2017 CLINICAL DATA:  Worsening weakness and shortness of breath over the last week. EXAM: CHEST  2 VIEW COMPARISON:  12/11/2016 FINDINGS: Heart size is normal. Chronic aortic atherosclerosis. There is interstitial and alveolar edema with small amount of bilateral pleural fluid. Findings are most consistent with congestive heart failure. No evidence of focal consolidation or lobar collapse. No significant bone finding. IMPRESSION: Congestive heart failure with interstitial and early alveolar edema and small effusions. Electronically Signed   By: Nelson Chimes M.D.   On: 06/04/2017 12:37    Scheduled Meds: .  aspirin EC  81 mg Oral Daily  . clopidogrel  75 mg Oral QPC lunch  . escitalopram  20 mg Oral q morning - 73V  . folic acid  1 mg Oral Daily  . furosemide  40 mg Intravenous BID  . latanoprost  1 drop Both Eyes QHS  . losartan  50 mg Oral Daily  . metoprolol tartrate  25 mg Oral BID  . pantoprazole  40 mg Oral BID  . ranolazine  500 mg Oral BID  . saccharomyces boulardii  250 mg Oral Daily  . sodium chloride flush  3 mL Intravenous Q12H  . timolol  1 drop Both Eyes Daily   Continuous Infusions: . sodium chloride    . heparin 800 Units/hr (06/01/17 1100)  . nitroGLYCERIN 70 mcg/min (06/01/17 1100)     LOS: 1 day   Time spent: 35 minutes.  Vance Gather, MD [facility] Pager [phone]  If 7PM-7AM, please contact night-coverage [email or web address] Password [email or web address] 06/01/2017, 11:44 AM

## 2017-06-02 DIAGNOSIS — I5041 Acute combined systolic (congestive) and diastolic (congestive) heart failure: Secondary | ICD-10-CM

## 2017-06-02 LAB — RENAL FUNCTION PANEL
ALBUMIN: 2.7 g/dL — AB (ref 3.5–5.0)
Albumin: 3.2 g/dL — ABNORMAL LOW (ref 3.5–5.0)
Anion gap: 10 (ref 5–15)
Anion gap: 11 (ref 5–15)
BUN: 19 mg/dL (ref 6–20)
BUN: 23 mg/dL — AB (ref 6–20)
CALCIUM: 8.2 mg/dL — AB (ref 8.9–10.3)
CHLORIDE: 82 mmol/L — AB (ref 101–111)
CHLORIDE: 80 mmol/L — AB (ref 101–111)
CO2: 25 mmol/L (ref 22–32)
CO2: 26 mmol/L (ref 22–32)
CREATININE: 0.89 mg/dL (ref 0.44–1.00)
CREATININE: 1.08 mg/dL — AB (ref 0.44–1.00)
Calcium: 8 mg/dL — ABNORMAL LOW (ref 8.9–10.3)
GFR calc non Af Amer: 44 mL/min — ABNORMAL LOW (ref >60)
GFR, EST AFRICAN AMERICAN: 51 mL/min — AB (ref >60)
GFR, EST NON AFRICAN AMERICAN: 56 mL/min — AB (ref >60)
Glucose, Bld: 110 mg/dL — ABNORMAL HIGH (ref 65–99)
Glucose, Bld: 109 mg/dL — ABNORMAL HIGH (ref 65–99)
PHOSPHORUS: 3.2 mg/dL (ref 2.5–4.6)
POTASSIUM: 3.8 mmol/L (ref 3.5–5.1)
Phosphorus: 3.7 mg/dL (ref 2.5–4.6)
Potassium: 3.8 mmol/L (ref 3.5–5.1)
SODIUM: 117 mmol/L — AB (ref 135–145)
Sodium: 117 mmol/L — CL (ref 135–145)

## 2017-06-02 LAB — CBC
HCT: 29.1 % — ABNORMAL LOW (ref 36.0–46.0)
Hemoglobin: 10.4 g/dL — ABNORMAL LOW (ref 12.0–15.0)
MCH: 32.9 pg (ref 26.0–34.0)
MCHC: 35.7 g/dL (ref 30.0–36.0)
MCV: 92.1 fL (ref 78.0–100.0)
PLATELETS: 353 10*3/uL (ref 150–400)
RBC: 3.16 MIL/uL — ABNORMAL LOW (ref 3.87–5.11)
RDW: 13.8 % (ref 11.5–15.5)
WBC: 19.2 10*3/uL — AB (ref 4.0–10.5)

## 2017-06-02 LAB — TSH: TSH: 1.019 u[IU]/mL (ref 0.350–4.500)

## 2017-06-02 LAB — BASIC METABOLIC PANEL
ANION GAP: 9 (ref 5–15)
BUN: 18 mg/dL (ref 6–20)
CALCIUM: 8.3 mg/dL — AB (ref 8.9–10.3)
CO2: 26 mmol/L (ref 22–32)
Chloride: 83 mmol/L — ABNORMAL LOW (ref 101–111)
Creatinine, Ser: 0.99 mg/dL (ref 0.44–1.00)
GFR, EST AFRICAN AMERICAN: 57 mL/min — AB (ref >60)
GFR, EST NON AFRICAN AMERICAN: 49 mL/min — AB (ref >60)
Glucose, Bld: 118 mg/dL — ABNORMAL HIGH (ref 65–99)
Potassium: 4 mmol/L (ref 3.5–5.1)
Sodium: 118 mmol/L — CL (ref 135–145)

## 2017-06-02 LAB — HEPARIN LEVEL (UNFRACTIONATED)
HEPARIN UNFRACTIONATED: 0.69 [IU]/mL (ref 0.30–0.70)
HEPARIN UNFRACTIONATED: 0.55 [IU]/mL (ref 0.30–0.70)

## 2017-06-02 LAB — NA AND K (SODIUM & POTASSIUM), RAND UR
Potassium Urine: 31 mmol/L
SODIUM UR: 26 mmol/L

## 2017-06-02 LAB — OSMOLALITY: OSMOLALITY: 248 mosm/kg — AB (ref 275–295)

## 2017-06-02 LAB — OSMOLALITY, URINE: Osmolality, Ur: 249 mOsm/kg — ABNORMAL LOW (ref 300–900)

## 2017-06-02 MED ORDER — ALBUMIN HUMAN 25 % IV SOLN
INTRAVENOUS | Status: AC
  Filled 2017-06-02: qty 50

## 2017-06-02 MED ORDER — IPRATROPIUM-ALBUTEROL 0.5-2.5 (3) MG/3ML IN SOLN
3.0000 mL | Freq: Four times a day (QID) | RESPIRATORY_TRACT | Status: DC
  Administered 2017-06-02: 3 mL via RESPIRATORY_TRACT
  Filled 2017-06-02: qty 3

## 2017-06-02 MED ORDER — NITROGLYCERIN 0.4 MG SL SUBL: 0.4000 mg | SUBLINGUAL_TABLET | SUBLINGUAL | Status: DC | PRN

## 2017-06-02 MED ORDER — FUROSEMIDE 10 MG/ML IJ SOLN
60.0000 mg | Freq: Three times a day (TID) | INTRAMUSCULAR | Status: DC
  Administered 2017-06-02 – 2017-06-03 (×3): 60 mg via INTRAVENOUS
  Filled 2017-06-02 (×3): qty 6

## 2017-06-02 MED ORDER — FUROSEMIDE 10 MG/ML IJ SOLN: 60.0000 mg | Freq: Two times a day (BID) | INTRAMUSCULAR | Status: DC

## 2017-06-02 MED ORDER — ALBUMIN HUMAN 25 % IV SOLN
25.0000 g | Freq: Four times a day (QID) | INTRAVENOUS | Status: DC
  Administered 2017-06-02 – 2017-06-03 (×3): 25 g via INTRAVENOUS
  Filled 2017-06-02: qty 50
  Filled 2017-06-02: qty 100
  Filled 2017-06-02 (×2): qty 50

## 2017-06-02 NOTE — Progress Notes (Signed)
ANTICOAGULATION CONSULT NOTE - f/u Consult  Pharmacy Consult for heparin Indication: chest pain/ACS  Allergies  Allergen Reactions  . Adhesive [Tape] Other (See Comments)    Burning, Itching.  . Amoxicillin Diarrhea    Has patient had a PCN reaction causing immediate rash, facial/tongue/throat swelling, SOB or lightheadedness with hypotension: No Has patient had a PCN reaction causing severe rash involving mucus membranes or skin necrosis:No Did patient had a PCN reaction that required hospitalization:No Has patient had a PCN reaction occurring within the last 10 years: Yes If all of the above answers are "NO", then may proceed with Cephalosporin use.   . Pregabalin     Weakness, diarrhea   . Simvastatin     Myalgias   . Sulfa Antibiotics Itching    Patient Measurements: Height: 5' 3.5" (161.3 cm) Weight: 134 lb 7.7 oz (61 kg) IBW/kg (Calculated) : 53.55 Heparin Dosing Weight: 59kg  Vital Signs: Temp: 97.3 F (36.3 C) (11/17 0800) Temp Source: Oral (11/17 0800) BP: 111/50 (11/17 1200) Pulse Rate: 77 (11/17 1200)  Labs: Recent Labs    06/10/2017 1202 06/12/2017 1729  06/10/2017 2358 06/01/17 0534 06/01/17 1741 06/02/17 0351 06/02/17 1202  HGB Ellen.7  --   --   --  11.6*  --  10.4*  --   HCT 35.7*  --   --   --  33.5*  --  29.1*  --   PLT 512*  --   --   --  436*  --  353  --   HEPARINUNFRC  --   --    < >  --  0.30 0.22* 0.69 0.55  CREATININE 0.82  --   --   --  0.95  --  0.99 0.89  TROPONINI  --  1.40*  --  1.41* 1.20*  --   --   --    < > = values in this interval not displayed.    Estimated Creatinine Clearance: 36.3 mL/min (by C-G formula based on SCr of 0.89 mg/dL).   Medical History: Past Medical History:  Diagnosis Date  . Chest pain   . Coronary artery disease   . GERD (gastroesophageal reflux disease)   . Hypertension   . Shortness of breath    laying down  . Stroke [facility])     Assessment: Ellen Marshall presents with weakness, sore throat, and shortness  of breath.  Patient with h/o chronic angina, HTN, diastolic HF, CVA, GIB.  Pharmacy asked to dose heparin gtt for NSTEMI  Today, 06/02/2017  HL continues to be therapeutic at current IV heparin rate of 1000 units/hr  No reported bleeding  Per notes, patient to be treated medically and will not have any surgical interventions  CBC ok  Goal of Therapy:  Heparin level 0.3-0.7 units/ml Monitor platelets by anticoagulation protocol: Yes   Plan:   Continue heparin drip at current rate of 1000 units/hr  Check daily heparin level and CBC  Monitor for bleeding    Adrian Saran, PharmD, BCPS Pager [phone] 06/02/2017 Ellen:52 PM

## 2017-06-02 NOTE — Progress Notes (Signed)
CRITICAL VALUE ALERT  Critical Value: Na+ 117  Date & Time Notied: 06/02/2017 12:51  Provider Notified:Dr. Carolin Sicks  Orders Received/Actions taken: no new orders currently

## 2017-06-02 NOTE — Progress Notes (Signed)
DAILY PROGRESS NOTE   Patient Name: Ellen Marshall Date of Encounter: 06/02/2017  Chief Complaint   No complaints  Patient Profile   83 year old female patient of Dr. Tamala Julian, recently seen in the office, with a history of chronic angina, chronic diastolic heart failure, prior stroke and hospitalization for chest pain and pneumonia in September 2016.  She now presents from [facility] assisted living with generalized weakness for several weeks along with shortness of breath and sore throat.  Subjective   Overnight noted to have evolving anterolateral ST elevation and TWI's - no chest pain complaints. Echo yesterday shows mid apical hypokinesis and findings suggestive of multivessel CAD/LAD ischemia. EKG this am was evaluated and there was consideration of transfer to [facility]. This morning she is confused, not oriented to place or date, does know month. Long discussion with her son who is the POA. He is in agreement with a less aggressive approach. Will not transfer to [facility] for cath. She is noted to be audibly wheezy today- foley catheter was placed and lasix has been increased.  Objective   Vitals:   06/02/17 0400 06/02/17 0500 06/02/17 0600 06/02/17 0700  BP: 128/64 (!) 121/42 117/61 (!) 90/44  Pulse: 86 91 93 85  Resp: 16 (!) _0 Temp: (!) 97.3 F (36.3 C)     TempSrc: Oral     SpO2: 97% 91% 95% 100%  Weight:    134 lb 7.7 oz (61 kg)  Height:        Intake/Output Summary (Last 24 hours) at 06/02/2017 0837 Last data filed at 06/02/2017 0800 Gross per 24 hour  Intake 940.67 ml  Output 675 ml  Net 265.67 ml   Filed Weights   06/01/17 0402 06/01/17 0630 06/02/17 0700  Weight: 136 lb 14.5 oz (62.1 kg) 137 lb 2 oz (62.2 kg) 134 lb 7.7 oz (61 kg)    Physical Exam   General appearance: alert, appears older than stated age, mild distress and confused Neck: JVD - 5 cm above sternal notch, no carotid bruit and thyroid not enlarged, symmetric, no  tenderness/mass/nodules Lungs: diminished breath sounds bilaterally and rales bibasilar Heart: regular rate and rhythm Abdomen: soft, non-tender; bowel sounds normal; no masses,  no organomegaly Extremities: extremities normal, atraumatic, no cyanosis or edema Neurologic: Mental status: Confused, oriented x 1  Inpatient Medications    Scheduled Meds: . aspirin EC  81 mg Oral Daily  . clopidogrel  75 mg Oral QPC lunch  . escitalopram  20 mg Oral q morning - 03O  . folic acid  1 mg Oral Daily  . furosemide  60 mg Intravenous Q8H  . latanoprost  1 drop Both Eyes QHS  . metoprolol tartrate  25 mg Oral BID  . pantoprazole  40 mg Oral BID  . ranolazine  500 mg Oral BID  . saccharomyces boulardii  250 mg Oral Daily  . sodium chloride flush  3 mL Intravenous Q12H  . timolol  1 drop Both Eyes Daily    Continuous Infusions: . sodium chloride    . heparin 1,000 Units/hr (06/02/17 0600)  . nitroGLYCERIN 50 mcg/min (06/02/17 0800)    PRN Meds: sodium chloride, acetaminophen, ALPRAZolam, morphine injection, ondansetron (ZOFRAN) IV, sodium chloride flush, traZODone   Labs   Results for orders placed or performed during the hospital encounter of 06/15/2017 (from the past 48 hour(s))  Brain natriuretic peptide     Status: Abnormal   Collection Time: 06/04/2017 12:02 PM  Result Value Ref  Range   B Natriuretic Peptide 1,295.7 (H) 0.0 - 100.0 pg/mL  CBC with Differential     Status: Abnormal   Collection Time: 05/26/2017 12:02 PM  Result Value Ref Range   WBC 23.3 (H) 4.0 - 10.5 K/uL   RBC 3.81 (L) 3.87 - 5.11 MIL/uL   Hemoglobin 12.7 12.0 - 15.0 g/dL   HCT 35.7 (L) 36.0 - 46.0 %   MCV 93.7 78.0 - 100.0 fL   MCH 33.3 26.0 - 34.0 pg   MCHC 35.6 30.0 - 36.0 g/dL   RDW 13.9 11.5 - 15.5 %   Platelets 512 (H) 150 - 400 K/uL   Neutrophils Relative % 90 %   Neutro Abs 20.9 (H) 1.7 - 7.7 K/uL   Lymphocytes Relative 4 %   Lymphs Abs 0.9 0.7 - 4.0 K/uL   Monocytes Relative 6 %   Monocytes  Absolute 1.5 (H) 0.1 - 1.0 K/uL   Eosinophils Relative 0 %   Eosinophils Absolute 0.1 0.0 - 0.7 K/uL   Basophils Relative 0 %   Basophils Absolute 0.0 0.0 - 0.1 K/uL  Comprehensive metabolic panel     Status: Abnormal   Collection Time: 06/08/2017 12:02 PM  Result Value Ref Range   Sodium 119 (LL) 135 - 145 mmol/L    Comment: CRITICAL RESULT CALLED TO, READ BACK BY AND VERIFIED WITH: M.QUICK RN [id number] A.QUIZON    Potassium 4.3 3.5 - 5.1 mmol/L   Chloride 86 (L) 101 - 111 mmol/L   CO2 22 22 - 32 mmol/L   Glucose, Bld 169 (H) 65 - 99 mg/dL   BUN 14 6 - 20 mg/dL   Creatinine, Ser 0.82 0.44 - 1.00 mg/dL   Calcium 8.9 8.9 - 10.3 mg/dL   Total Protein 6.7 6.5 - 8.1 g/dL   Albumin 3.6 3.5 - 5.0 g/dL   AST 27 15 - 41 U/L   ALT 14 14 - 54 U/L   Alkaline Phosphatase 58 38 - 126 U/L   Total Bilirubin 0.8 0.3 - 1.2 mg/dL   GFR calc non Af Amer >60 >60 mL/min   GFR calc Af Amer >60 >60 mL/min    Comment: (NOTE) The eGFR has been calculated using the CKD EPI equation. This calculation has not been validated in all clinical situations. eGFR's persistently <60 mL/min signify possible Chronic Kidney Disease.    Anion gap 11 5 - 15  I-stat troponin, ED     Status: Abnormal   Collection Time: 05/26/2017 12:24 PM  Result Value Ref Range   Troponin i, poc 1.36 (HH) 0.00 - 0.08 ng/mL   Comment NOTIFIED PHYSICIAN    Comment 3            Comment: Due to the release kinetics of cTnI, a negative result within the first hours of the onset of symptoms does not rule out myocardial infarction with certainty. If myocardial infarction is still suspected, repeat the test at appropriate intervals.   Troponin I     Status: Abnormal   Collection Time: 05/21/2017  5:29 PM  Result Value Ref Range   Troponin I 1.40 (HH) <0.03 ng/mL    Comment: CRITICAL RESULT CALLED TO, READ BACK BY AND VERIFIED WITH: HODGES,W. RN _0  ON 11.15.18 BY COHEN,K   Urinalysis, Routine w reflex microscopic     Status: None    Collection Time: 05/28/2017  5:43 PM  Result Value Ref Range   Color, Urine YELLOW YELLOW   APPearance CLEAR CLEAR   Specific  Gravity, Urine 1.008 1.005 - 1.030   pH 7.0 5.0 - 8.0   Glucose, UA NEGATIVE NEGATIVE mg/dL   Hgb urine dipstick NEGATIVE NEGATIVE   Bilirubin Urine NEGATIVE NEGATIVE   Ketones, ur NEGATIVE NEGATIVE mg/dL   Protein, ur NEGATIVE NEGATIVE mg/dL   Nitrite NEGATIVE NEGATIVE   Leukocytes, UA NEGATIVE NEGATIVE  MRSA PCR Screening     Status: None   Collection Time: 06/06/2017  5:48 PM  Result Value Ref Range   MRSA by PCR NEGATIVE NEGATIVE    Comment:        The GeneXpert MRSA Assay (FDA approved for NASAL specimens only), is one component of a comprehensive MRSA colonization surveillance program. It is not intended to diagnose MRSA infection nor to guide or monitor treatment for MRSA infections.   Heparin level (unfractionated)     Status: None   Collection Time: 05/25/2017  9:39 PM  Result Value Ref Range   Heparin Unfractionated 0.37 0.30 - 0.70 IU/mL    Comment:        IF HEPARIN RESULTS ARE BELOW EXPECTED VALUES, AND PATIENT DOSAGE HAS BEEN CONFIRMED, SUGGEST FOLLOW UP TESTING OF ANTITHROMBIN III LEVELS.   Troponin I     Status: Abnormal   Collection Time: 06/09/2017 11:58 PM  Result Value Ref Range   Troponin I 1.41 (HH) <0.03 ng/mL    Comment: CRITICAL VALUE NOTED.  VALUE IS CONSISTENT WITH PREVIOUSLY REPORTED AND CALLED VALUE.  CBC     Status: Abnormal   Collection Time: 06/01/17  5:34 AM  Result Value Ref Range   WBC 21.8 (H) 4.0 - 10.5 K/uL   RBC 3.57 (L) 3.87 - 5.11 MIL/uL   Hemoglobin 11.6 (L) 12.0 - 15.0 g/dL   HCT 33.5 (L) 36.0 - 46.0 %   MCV 93.8 78.0 - 100.0 fL   MCH 32.5 26.0 - 34.0 pg   MCHC 34.6 30.0 - 36.0 g/dL   RDW 14.1 11.5 - 15.5 %   Platelets 436 (H) 150 - 400 K/uL  Basic metabolic panel     Status: Abnormal   Collection Time: 06/01/17  5:34 AM  Result Value Ref Range   Sodium 121 (L) 135 - 145 mmol/L   Potassium 4.0  3.5 - 5.1 mmol/L   Chloride 87 (L) 101 - 111 mmol/L   CO2 27 22 - 32 mmol/L   Glucose, Bld 151 (H) 65 - 99 mg/dL   BUN 13 6 - 20 mg/dL   Creatinine, Ser 0.95 0.44 - 1.00 mg/dL   Calcium 8.5 (L) 8.9 - 10.3 mg/dL   GFR calc non Af Amer 52 (L) >60 mL/min   GFR calc Af Amer 60 (L) >60 mL/min    Comment: (NOTE) The eGFR has been calculated using the CKD EPI equation. This calculation has not been validated in all clinical situations. eGFR's persistently <60 mL/min signify possible Chronic Kidney Disease.    Anion gap 7 5 - 15  Heparin level (unfractionated)     Status: None   Collection Time: 06/01/17  5:34 AM  Result Value Ref Range   Heparin Unfractionated 0.30 0.30 - 0.70 IU/mL    Comment:        IF HEPARIN RESULTS ARE BELOW EXPECTED VALUES, AND PATIENT DOSAGE HAS BEEN CONFIRMED, SUGGEST FOLLOW UP TESTING OF ANTITHROMBIN III LEVELS.   Troponin I     Status: Abnormal   Collection Time: 06/01/17  5:34 AM  Result Value Ref Range   Troponin I 1.20 (HH) <0.03 ng/mL  Comment: CRITICAL VALUE NOTED.  VALUE IS CONSISTENT WITH PREVIOUSLY REPORTED AND CALLED VALUE.  Heparin level (unfractionated)     Status: Abnormal   Collection Time: 06/01/17  5:41 PM  Result Value Ref Range   Heparin Unfractionated 0.22 (L) 0.30 - 0.70 IU/mL    Comment:        IF HEPARIN RESULTS ARE BELOW EXPECTED VALUES, AND PATIENT DOSAGE HAS BEEN CONFIRMED, SUGGEST FOLLOW UP TESTING OF ANTITHROMBIN III LEVELS.   CBC     Status: Abnormal   Collection Time: 06/02/17  3:51 AM  Result Value Ref Range   WBC 19.2 (H) 4.0 - 10.5 K/uL   RBC 3.16 (L) 3.87 - 5.11 MIL/uL   Hemoglobin 10.4 (L) 12.0 - 15.0 g/dL   HCT 29.1 (L) 36.0 - 46.0 %   MCV 92.1 78.0 - 100.0 fL   MCH 32.9 26.0 - 34.0 pg   MCHC 35.7 30.0 - 36.0 g/dL   RDW 13.8 11.5 - 15.5 %   Platelets 353 150 - 400 K/uL  Basic metabolic panel     Status: Abnormal   Collection Time: 06/02/17  3:51 AM  Result Value Ref Range   Sodium 118 (LL) 135 - 145  mmol/L    Comment: CRITICAL RESULT CALLED TO, READ BACK BY AND VERIFIED WITH: R.MACINTOSH,RN [id number] _0  BY V.WILKINS DELTA CHECK NOTED    Potassium 4.0 3.5 - 5.1 mmol/L   Chloride 83 (L) 101 - 111 mmol/L   CO2 26 22 - 32 mmol/L   Glucose, Bld 118 (H) 65 - 99 mg/dL   BUN 18 6 - 20 mg/dL   Creatinine, Ser 0.99 0.44 - 1.00 mg/dL   Calcium 8.3 (L) 8.9 - 10.3 mg/dL   GFR calc non Af Amer 49 (L) >60 mL/min   GFR calc Af Amer 57 (L) >60 mL/min    Comment: (NOTE) The eGFR has been calculated using the CKD EPI equation. This calculation has not been validated in all clinical situations. eGFR's persistently <60 mL/min signify possible Chronic Kidney Disease.    Anion gap 9 5 - 15  Heparin level (unfractionated)     Status: None   Collection Time: 06/02/17  3:51 AM  Result Value Ref Range   Heparin Unfractionated 0.69 0.30 - 0.70 IU/mL    Comment:        IF HEPARIN RESULTS ARE BELOW EXPECTED VALUES, AND PATIENT DOSAGE HAS BEEN CONFIRMED, SUGGEST FOLLOW UP TESTING OF ANTITHROMBIN III LEVELS.     ECG   Sinus rhythm at 91 - anterolateral ST elevation and TWI's (evolving)  - Personally Reviewed  Telemetry   Sinus tachycardia - Personally Reviewed  Radiology    Dg Chest 2 View  Result Date: 05/26/2017 CLINICAL DATA:  Worsening weakness and shortness of breath over the last week. EXAM: CHEST  2 VIEW COMPARISON:  12/11/2016 FINDINGS: Heart size is normal. Chronic aortic atherosclerosis. There is interstitial and alveolar edema with small amount of bilateral pleural fluid. Findings are most consistent with congestive heart failure. No evidence of focal consolidation or lobar collapse. No significant bone finding. IMPRESSION: Congestive heart failure with interstitial and early alveolar edema and small effusions. Electronically Signed   By: Nelson Chimes M.D.   On: 06/08/2017 12:37    Cardiac Studies   LV EF: 35% -    40%  ------------------------------------------------------------------- Indications:      CHF - 428.0.  ------------------------------------------------------------------- History:   PMH:   Coronary artery disease.  Stroke.  Risk factors: Hypertension.  -------------------------------------------------------------------  Study Conclusions  - Left ventricle: The mid-apical segments are severely hypokinetic,   while the basal segments contract normally. The appearance is   most suggestive of stress cardiomyopathy (takotsubo syndrome),   but cannot entirely exclude multivessel CAD. The cavity size was   normal. Wall thickness was normal. Systolic function was   moderately reduced. The estimated ejection fraction was in the   range of 35% to 40%. Features are consistent with a pseudonormal   left ventricular filling pattern, with concomitant abnormal   relaxation and increased filling pressure (grade 2 diastolic   dysfunction). - Mitral valve: Calcified annulus. There was mild regurgitation. - Pulmonary arteries: Systolic pressure was moderately increased.   PA peak pressure: 58 mm Hg (S).  Assessment   Principal Problem:   Acute congestive heart failure (HCC) Active Problems:   Hyponatremia with excess extracellular fluid volume   Weakness   NSTEMI (non-ST elevated myocardial infarction) (HCC)   Acute CHF (congestive heart failure) ([place])   Plan   1. Remains chest pain free - noted to have evolving anterolateral ST elevation and TWI's - has been on IV heparin, troponin has been flat. Initially diuresed, however, not much out overnight - foley placed today, increased lasix to TID. Sodium has fallen again - will escalate diuresis. After much discussion with his son and POA, would not escalate interventions such as catheterization. Plan medical therapy.   Time Spent Directly with Patient:  I have spent a total of 25 minutes with the patient reviewing hospital notes, telemetry,  EKGs, labs and examining the patient as well as establishing an assessment and plan that was discussed personally with the patient. > 50% of time was spent in direct patient care.  Length of Stay:  LOS: 2 days   Pixie Casino, MD, [facility], [place] Director of the [facility] Attending Cardiologist  Direct Dial: [phone]  Fax: [phone]  Website:  [email or web address] 06/02/2017, 8:37 AM

## 2017-06-02 NOTE — Significant Event (Signed)
I was notified by the nurse that the patient's ECG this morning read out "ACUTE STEMI." Her ECG was reviewed and showed some ST elevations in 1, II, AVL, with progression of previously noted TWI in precordial leads. ECG was repeated and showed improvement of ST elevations in limb leads but persistent/increasing repolarization abnormalities and precordial leads.  The patient has been free of chest pain or other symptoms.  The case was discussed with Dr. Gwenlyn Found. It is felt that her case is unlikely to be consistent with STEMI; however, given her cardiac history with evolving ECG changes and plan for ischemic evaluation, transfer to [facility] will be coordinated.

## 2017-06-02 NOTE — Progress Notes (Addendum)
CRITICAL VALUE ALERT  Critical Value:  Na+ - 118   Date & Time Notied:  06/02/2017 0517   Provider Notified: X. Blount  Orders Received/Actions taken: Given orders to give lasix does early

## 2017-06-02 NOTE — Progress Notes (Addendum)
Follow up:  Repeat BMP showed Na 117  -urine Na 26 on IV lasix and urine osm 249  Plan: -continue lasix 60 mg IV q8hr -add IV albumin -continue foley, strict I/O -check TSH and am cortisol.  -check renal panel BID D/w RN

## 2017-06-02 NOTE — Progress Notes (Signed)
Called Dr. Carolin Sicks about lack of response from the Lasix given earlier today.  Not much response from the Lasix 60 mg IV given.  No new orders for now.  Continue to monitor patient closely.  Raelynn Corron Roselie Awkward RN

## 2017-06-02 NOTE — Progress Notes (Addendum)
PROGRESS NOTE    Ellen Marshall  VZC:[id number] DOB: 05/30/1926 DOA: 06/07/2017 PCP: Josetta Huddle, MD   Brief Narrative: 83 year old female with history of chronic angina, chronic congestive heart failure, prior stroke who lives in assisted living facility presented to the hospital with shortness of breath for about 5 days prior to admission.  Patient was found to have EKG changes and elevated troponin consistent with ACS and acute on chronic CHF.  Assessment & Plan:   #Acute combined systolic and diastolic congestive heart failure: Patient with elevated BNP, acute pulmonary edema, shortness of breath, echo with EF of 35-40% and grade 2 diastolic dysfunction with Tokotsubo syndrome.  -Patient was on Lasix 40 mg twice a day with no significant urine output.  I increase the dose of Lasix to 60 mg IV every 8 hourly, insert Foley catheter for a strict ins and out, daily weight. -The patient denies chest pain or shortness of breath. -Cardiology consult appreciated. -Holding losartan because of hypotension.  Continue metoprolol, Ranexa  # NSTEMI: Currently on IV nitroglycerin and IV heparin drip.  Continue aspirin, Plavix, metoprolol.  No chest pain.  Echocardiogram with wall motion abnormalities consistent with coronary artery disease.  Further plan deferred to cardiology, likely only medical management.  Patient is DNR.  I discussed severity of illness and comorbidities with patient's son at bedside.  #Hypervolemic hyponatremia in the setting of CHF: Patient with fluid overload with minimal urine output.  I increased the dose of the loop diuretics.  Check urine sodium, potassium and urine osmolality.  Monitor labs.  If no improvement in sodium with loop diuretics may need to consider tolvaptan.  #Leukocytosis: Likely stress related.  No sign of infection.  Continue to monitor.  #History of a stroke: Continue aspirin, Plavix.  Supportive care.  #History of hypertension: Patient was hypotensive  this morning.  Holding losartan.  Continue to monitor.  DVT prophylaxis: IV heparin Code Status: DNR Family Communication: Discussed with the patient's son at bedside Disposition Plan: Currently admitted    Consultants:   Cardiologist  Procedures: Echo Antimicrobials: None  Subjective: Seen and examined at bedside.  Denies chest pain, shortness of breath.  Has weakness.  No nausea or vomiting.  Objective: Vitals:   06/02/17 0700 06/02/17 0800 06/02/17 0900 06/02/17 1000  BP: (!) 90/44 (!) 123/54 (!) 145/64 (!) 105/47  Pulse: 85 88 98 84  Resp: 16 15 18 15   Temp:  (!) 97.3 F (36.3 C)    TempSrc:  Oral    SpO2: 100% 100% 98% 100%  Weight: 61 kg (134 lb 7.7 oz)     Height:        Intake/Output Summary (Last 24 hours) at 06/02/2017 1023 Last data filed at 06/02/2017 1000 Gross per 24 hour  Intake 976.05 ml  Output 1075 ml  Net -98.95 ml   Filed Weights   06/01/17 0402 06/01/17 0630 06/02/17 0700  Weight: 62.1 kg (136 lb 14.5 oz) 62.2 kg (137 lb 2 oz) 61 kg (134 lb 7.7 oz)    Examination:  General exam: Ill looking female lying on bed, not in distress Respiratory system: Bilateral crackles, intermittent expiratory wheeze Cardiovascular system: S1 & S2 heard, RRR.  No pedal edema. Gastrointestinal system: Abdomen is nondistended, soft and nontender. Normal bowel sounds heard. Central nervous system: Alert awake and following commands. Extremities: Symmetric 5 x 5 power. Skin: No rashes, lesions or ulcers    Data Reviewed: I have personally reviewed following labs and imaging studies  CBC: Recent Labs  Lab 06/13/2017 1202 06/01/17 0534 06/02/17 0351  WBC 23.3* 21.8* 19.2*  NEUTROABS 20.9*  --   --   HGB 12.7 11.6* 10.4*  HCT 35.7* 33.5* 29.1*  MCV 93.7 93.8 92.1  PLT 512* 436* 324   Basic Metabolic Panel: Recent Labs  Lab 05/30/2017 1202 06/01/17 0534 06/02/17 0351  NA 119* 121* 118*  K 4.3 4.0 4.0  CL 86* 87* 83*  CO2 22 27 26   GLUCOSE 169*  151* 118*  BUN 14 13 18   CREATININE 0.82 0.95 0.99  CALCIUM 8.9 8.5* 8.3*   GFR: Estimated Creatinine Clearance: 32.6 mL/min (by C-G formula based on SCr of 0.99 mg/dL). Liver Function Tests: Recent Labs  Lab 06/07/2017 1202  AST 27  ALT 14  ALKPHOS 58  BILITOT 0.8  PROT 6.7  ALBUMIN 3.6   No results for input(s): LIPASE, AMYLASE in the last 168 hours. No results for input(s): AMMONIA in the last 168 hours. Coagulation Profile: No results for input(s): INR, PROTIME in the last 168 hours. Cardiac Enzymes: Recent Labs  Lab 06/04/2017 1729 05/23/2017 2358 06/01/17 0534  TROPONINI 1.40* 1.41* 1.20*   BNP (last 3 results) No results for input(s): PROBNP in the last 8760 hours. HbA1C: No results for input(s): HGBA1C in the last 72 hours. CBG: No results for input(s): GLUCAP in the last 168 hours. Lipid Profile: No results for input(s): CHOL, HDL, LDLCALC, TRIG, CHOLHDL, LDLDIRECT in the last 72 hours. Thyroid Function Tests: No results for input(s): TSH, T4TOTAL, FREET4, T3FREE, THYROIDAB in the last 72 hours. Anemia Panel: No results for input(s): VITAMINB12, FOLATE, FERRITIN, TIBC, IRON, RETICCTPCT in the last 72 hours. Sepsis Labs: No results for input(s): PROCALCITON, LATICACIDVEN in the last 168 hours.  Recent Results (from the past 240 hour(s))  MRSA PCR Screening     Status: None   Collection Time: 05/29/2017  5:48 PM  Result Value Ref Range Status   MRSA by PCR NEGATIVE NEGATIVE Final    Comment:        The GeneXpert MRSA Assay (FDA approved for NASAL specimens only), is one component of a comprehensive MRSA colonization surveillance program. It is not intended to diagnose MRSA infection nor to guide or monitor treatment for MRSA infections.          Radiology Studies: Dg Chest 2 View  Result Date: 05/21/2017 CLINICAL DATA:  Worsening weakness and shortness of breath over the last week. EXAM: CHEST  2 VIEW COMPARISON:  12/11/2016 FINDINGS: Heart size  is normal. Chronic aortic atherosclerosis. There is interstitial and alveolar edema with small amount of bilateral pleural fluid. Findings are most consistent with congestive heart failure. No evidence of focal consolidation or lobar collapse. No significant bone finding. IMPRESSION: Congestive heart failure with interstitial and early alveolar edema and small effusions. Electronically Signed   By: Nelson Chimes M.D.   On: 06/08/2017 12:37        Scheduled Meds: . aspirin EC  81 mg Oral Daily  . clopidogrel  75 mg Oral QPC lunch  . escitalopram  20 mg Oral q morning - 40N  . folic acid  1 mg Oral Daily  . furosemide  60 mg Intravenous Q8H  . latanoprost  1 drop Both Eyes QHS  . metoprolol tartrate  25 mg Oral BID  . pantoprazole  40 mg Oral BID  . ranolazine  500 mg Oral BID  . saccharomyces boulardii  250 mg Oral Daily  . sodium chloride flush  3 mL Intravenous Q12H  .  timolol  1 drop Both Eyes Daily   Continuous Infusions: . sodium chloride    . heparin 1,000 Units/hr (06/02/17 0600)  . nitroGLYCERIN 50 mcg/min (06/02/17 0800)     LOS: 2 days    Kennadee Walthour Tanna Furry, MD [facility] Pager [phone]  If 7PM-7AM, please contact night-coverage [email or web address] Password [email or web address] 06/02/2017, 10:23 AM

## 2017-06-02 NOTE — Progress Notes (Signed)
Pharmacy - IV heparin  Assessment:    Please see note from Hershal Coria, PharmD 11/16 for full details.  Briefly, 83 y.o. female on IV heparin for NSTEMI.   0351 HL=0.69 at goal, no infusion or bleeding issues per RN.   Plan:  Continue heparin drip at 1000 units/hr  Check confirmatory heparin level in 8 hours  Daily CBC, daily heparin level once stable  Monitor for signs of bleeding or thrombosis   Lawana Pai R\ 06/02/2017, 4:30 AM

## 2017-06-02 NOTE — Progress Notes (Signed)
CRITICAL VALUE ALERT  Critical Value:  Serum osmolarity 248, Na+ 117  Date & Time Notied:  06/02/2017 17:49  Provider Notified: Dr. Carolin Sicks  Orders Received/Actions taken:  No new orders currently

## 2017-06-03 ENCOUNTER — Inpatient Hospital Stay (HOSPITAL_COMMUNITY): Payer: Medicare Other

## 2017-06-03 DIAGNOSIS — Z7189 Other specified counseling: Secondary | ICD-10-CM

## 2017-06-03 DIAGNOSIS — Z515 Encounter for palliative care: Secondary | ICD-10-CM

## 2017-06-03 DIAGNOSIS — N179 Acute kidney failure, unspecified: Secondary | ICD-10-CM

## 2017-06-03 LAB — CBC
HEMATOCRIT: 25.4 % — AB (ref 36.0–46.0)
HEMOGLOBIN: 8.9 g/dL — AB (ref 12.0–15.0)
MCH: 32.6 pg (ref 26.0–34.0)
MCHC: 35 g/dL (ref 30.0–36.0)
MCV: 93 fL (ref 78.0–100.0)
PLATELETS: 318 10*3/uL (ref 150–400)
RBC: 2.73 MIL/uL — ABNORMAL LOW (ref 3.87–5.11)
RDW: 13.9 % (ref 11.5–15.5)
WBC: 18.6 10*3/uL — ABNORMAL HIGH (ref 4.0–10.5)

## 2017-06-03 LAB — RENAL FUNCTION PANEL
Albumin: 4 g/dL (ref 3.5–5.0)
Anion gap: 11 (ref 5–15)
BUN: 29 mg/dL — AB (ref 6–20)
CHLORIDE: 81 mmol/L — AB (ref 101–111)
CO2: 27 mmol/L (ref 22–32)
CREATININE: 1.4 mg/dL — AB (ref 0.44–1.00)
Calcium: 8.7 mg/dL — ABNORMAL LOW (ref 8.9–10.3)
GFR calc Af Amer: 37 mL/min — ABNORMAL LOW (ref >60)
GFR, EST NON AFRICAN AMERICAN: 32 mL/min — AB (ref >60)
GLUCOSE: 110 mg/dL — AB (ref 65–99)
POTASSIUM: 4.2 mmol/L (ref 3.5–5.1)
Phosphorus: 4.2 mg/dL (ref 2.5–4.6)
Sodium: 119 mmol/L — CL (ref 135–145)

## 2017-06-03 LAB — HEPARIN LEVEL (UNFRACTIONATED): HEPARIN UNFRACTIONATED: 0.36 [IU]/mL (ref 0.30–0.70)

## 2017-06-03 LAB — CORTISOL-AM, BLOOD: Cortisol - AM: 18.9 ug/dL (ref 6.7–22.6)

## 2017-06-03 MED ORDER — BIOTENE DRY MOUTH MT LIQD: 15.0000 mL | OROMUCOSAL | Status: DC | PRN

## 2017-06-03 MED ORDER — GLYCOPYRROLATE 1 MG PO TABS: 1.0000 mg | ORAL_TABLET | ORAL | Status: DC | PRN

## 2017-06-03 MED ORDER — MORPHINE SULFATE (PF) 4 MG/ML IV SOLN: 2.0000 mg | INTRAVENOUS | Status: DC | PRN

## 2017-06-03 MED ORDER — LIP MEDEX EX OINT
TOPICAL_OINTMENT | CUTANEOUS | Status: AC
  Filled 2017-06-03: qty 7

## 2017-06-03 MED ORDER — POLYVINYL ALCOHOL 1.4 % OP SOLN
1.0000 [drp] | Freq: Four times a day (QID) | OPHTHALMIC | Status: DC | PRN
  Filled 2017-06-03: qty 15

## 2017-06-03 MED ORDER — LORAZEPAM 2 MG/ML IJ SOLN: 1.0000 mg | INTRAMUSCULAR | Status: DC | PRN

## 2017-06-03 MED ORDER — GLYCOPYRROLATE 0.2 MG/ML IJ SOLN
0.2000 mg | INTRAMUSCULAR | Status: DC | PRN
  Administered 2017-06-04 (×2): 0.2 mg via INTRAVENOUS
  Filled 2017-06-03 (×3): qty 1

## 2017-06-03 MED ORDER — ONDANSETRON 4 MG PO TBDP
4.0000 mg | ORAL_TABLET | Freq: Four times a day (QID) | ORAL | Status: DC | PRN
Start: 2017-06-03 — End: 2017-06-05

## 2017-06-03 MED ORDER — HALOPERIDOL LACTATE 2 MG/ML PO CONC
0.5000 mg | ORAL | Status: DC | PRN
  Filled 2017-06-03: qty 0.3

## 2017-06-03 MED ORDER — LORAZEPAM 2 MG/ML IJ SOLN
0.5000 mg | INTRAMUSCULAR | Status: DC | PRN
  Administered 2017-06-03: 0.5 mg via INTRAVENOUS
  Filled 2017-06-03: qty 1

## 2017-06-03 MED ORDER — MORPHINE BOLUS VIA INFUSION
2.0000 mg | INTRAVENOUS | Status: DC | PRN
  Administered 2017-06-04 (×2): 2 mg via INTRAVENOUS
  Filled 2017-06-03: qty 2

## 2017-06-03 MED ORDER — GLYCOPYRROLATE 0.2 MG/ML IJ SOLN: 0.2000 mg | INTRAMUSCULAR | Status: DC | PRN

## 2017-06-03 MED ORDER — HALOPERIDOL 0.5 MG PO TABS: 0.5000 mg | ORAL_TABLET | ORAL | Status: DC | PRN

## 2017-06-03 MED ORDER — HALOPERIDOL LACTATE 5 MG/ML IJ SOLN: 0.5000 mg | INTRAMUSCULAR | Status: DC | PRN

## 2017-06-03 MED ORDER — IPRATROPIUM-ALBUTEROL 0.5-2.5 (3) MG/3ML IN SOLN
3.0000 mL | RESPIRATORY_TRACT | Status: DC | PRN
  Administered 2017-06-03: 3 mL via RESPIRATORY_TRACT
  Filled 2017-06-03: qty 3

## 2017-06-03 MED ORDER — ONDANSETRON HCL 4 MG/2ML IJ SOLN: 4.0000 mg | Freq: Four times a day (QID) | INTRAMUSCULAR | Status: DC | PRN

## 2017-06-03 MED ORDER — SODIUM CHLORIDE 0.9 % IV SOLN
1.0000 mg/h | INTRAVENOUS | Status: DC
  Administered 2017-06-03 – 2017-06-04 (×2): 1 mg/h via INTRAVENOUS
  Filled 2017-06-03 (×2): qty 10

## 2017-06-03 MED ORDER — LORAZEPAM 1 MG PO TABS: 1.0000 mg | ORAL_TABLET | ORAL | Status: DC | PRN

## 2017-06-03 MED ORDER — LORAZEPAM 2 MG/ML PO CONC: 1.0000 mg | ORAL | Status: DC | PRN

## 2017-06-03 NOTE — Progress Notes (Signed)
DAILY PROGRESS NOTE   Patient Name: Ellen Marshall Date of Encounter: 06/03/2017  Chief Complaint   Decompensated overnight- on bipap  Patient Profile   83 year old female patient of Dr. Tamala Julian, recently seen in the office, with a history of chronic angina, chronic diastolic heart failure, prior stroke and hospitalization for chest pain and pneumonia in September 2016.  She now presents from [facility] assisted living with generalized weakness for several weeks along with shortness of breath and sore throat.  Subjective   Required bipap overnight for respiratory difficulty - sodium remains low, despite diuresis, free water restriction and orders for albumin. Renal function declining in the face of diuresis. CXR this am shows significant volume overload. Discussion with patient and son - he believes she would want a comfort approach at this point.  Objective   Vitals:   06/03/17 0600 06/03/17 0645 06/03/17 0700 06/03/17 0800  BP: 127/71  (!) 99/58 (!) 120/54  Pulse: (!) 116   (!) 111  Resp: 14  (!) 28 14  Temp:    98.1 F (36.7 C)  TempSrc:    Axillary  SpO2: 95%  94% 94%  Weight:  132 lb 15 oz (60.3 kg)    Height:        Intake/Output Summary (Last 24 hours) at 06/03/2017 3716 Last data filed at 06/03/2017 0800 Gross per 24 hour  Intake 706 ml  Output 810 ml  Net -104 ml   Filed Weights   06/01/17 0630 06/02/17 0700 06/03/17 0645  Weight: 137 lb 2 oz (62.2 kg) 134 lb 7.7 oz (61 kg) 132 lb 15 oz (60.3 kg)    Physical Exam   General appearance: alert, appears older than stated age and somnolent Neck: JVD - 5 cm above sternal notch, no carotid bruit and thyroid not enlarged, symmetric, no tenderness/mass/nodules Lungs: rales bilaterally Heart: regular tachycardia Abdomen: soft, non-tender; bowel sounds normal; no masses,  no organomegaly Extremities: extremities normal, atraumatic, no cyanosis or edema Pulses: 2+ and symmetric Skin: pale, warm, dry Neurologic:  Mental status: somnolent, does not arouse to stimulation  Inpatient Medications    Scheduled Meds: . furosemide  60 mg Intravenous Q8H  . latanoprost  1 drop Both Eyes QHS  . metoprolol tartrate  25 mg Oral BID  . pantoprazole  40 mg Oral BID  . sodium chloride flush  3 mL Intravenous Q12H  . timolol  1 drop Both Eyes Daily    Continuous Infusions: . sodium chloride    . albumin human      PRN Meds: sodium chloride, acetaminophen, ALPRAZolam, ipratropium-albuterol, LORazepam, morphine injection, nitroGLYCERIN, ondansetron (ZOFRAN) IV, sodium chloride flush, traZODone   Labs   Results for orders placed or performed during the hospital encounter of 06/02/2017 (from the past 48 hour(s))  Heparin level (unfractionated)     Status: Abnormal   Collection Time: 06/01/17  5:41 PM  Result Value Ref Range   Heparin Unfractionated 0.22 (L) 0.30 - 0.70 IU/mL    Comment:        IF HEPARIN RESULTS ARE BELOW EXPECTED VALUES, AND PATIENT DOSAGE HAS BEEN CONFIRMED, SUGGEST FOLLOW UP TESTING OF ANTITHROMBIN III LEVELS.   CBC     Status: Abnormal   Collection Time: 06/02/17  3:51 AM  Result Value Ref Range   WBC 19.2 (H) 4.0 - 10.5 K/uL   RBC 3.16 (L) 3.87 - 5.11 MIL/uL   Hemoglobin 10.4 (L) 12.0 - 15.0 g/dL   HCT 29.1 (L) 36.0 - 46.0 %  MCV 92.1 78.0 - 100.0 fL   MCH 32.9 26.0 - 34.0 pg   MCHC 35.7 30.0 - 36.0 g/dL   RDW 13.8 11.5 - 15.5 %   Platelets 353 150 - 400 K/uL  Basic metabolic panel     Status: Abnormal   Collection Time: 06/02/17  3:51 AM  Result Value Ref Range   Sodium 118 (LL) 135 - 145 mmol/L    Comment: CRITICAL RESULT CALLED TO, READ BACK BY AND VERIFIED WITH: R.MACINTOSH,RN [id number] '@0514'  BY V.WILKINS DELTA CHECK NOTED    Potassium 4.0 3.5 - 5.1 mmol/L   Chloride 83 (L) 101 - 111 mmol/L   CO2 26 22 - 32 mmol/L   Glucose, Bld 118 (H) 65 - 99 mg/dL   BUN 18 6 - 20 mg/dL   Creatinine, Ser 0.99 0.44 - 1.00 mg/dL   Calcium 8.3 (L) 8.9 - 10.3 mg/dL   GFR calc non  Af Amer 49 (L) >60 mL/min   GFR calc Af Amer 57 (L) >60 mL/min    Comment: (NOTE) The eGFR has been calculated using the CKD EPI equation. This calculation has not been validated in all clinical situations. eGFR's persistently <60 mL/min signify possible Chronic Kidney Disease.    Anion gap 9 5 - 15  Heparin level (unfractionated)     Status: None   Collection Time: 06/02/17  3:51 AM  Result Value Ref Range   Heparin Unfractionated 0.69 0.30 - 0.70 IU/mL    Comment:        IF HEPARIN RESULTS ARE BELOW EXPECTED VALUES, AND PATIENT DOSAGE HAS BEEN CONFIRMED, SUGGEST FOLLOW UP TESTING OF ANTITHROMBIN III LEVELS.   Na and K (sodium & potassium), rand urine     Status: None   Collection Time: 06/02/17  9:00 AM  Result Value Ref Range   Sodium, Ur 26 mmol/L   Potassium Urine 31 mmol/L    Comment: Performed at [facility] [address], [place], [place] [address]  Osmolality, urine     Status: Abnormal   Collection Time: 06/02/17  9:00 AM  Result Value Ref Range   Osmolality, Ur 249 (L) 300 - 900 mOsm/kg    Comment: Performed at [facility] [address], [place], [place] [address]  Heparin level (unfractionated)     Status: None   Collection Time: 06/02/17 12:02 PM  Result Value Ref Range   Heparin Unfractionated 0.55 0.30 - 0.70 IU/mL    Comment:        IF HEPARIN RESULTS ARE BELOW EXPECTED VALUES, AND PATIENT DOSAGE HAS BEEN CONFIRMED, SUGGEST FOLLOW UP TESTING OF ANTITHROMBIN III LEVELS.   Renal function panel     Status: Abnormal   Collection Time: 06/02/17 12:02 PM  Result Value Ref Range   Sodium 117 (LL) 135 - 145 mmol/L    Comment: CRITICAL RESULT CALLED TO, READ BACK BY AND VERIFIED WITH: ARNOLD,A AT 12:55PM ON 06/02/17 BY FESTERMAN,C    Potassium 3.8 3.5 - 5.1 mmol/L   Chloride 82 (L) 101 - 111 mmol/L   CO2 25 22 - 32 mmol/L   Glucose, Bld 110 (H) 65 - 99 mg/dL   BUN 19 6 - 20 mg/dL   Creatinine, Ser 0.89 0.44 - 1.00 mg/dL   Calcium 8.0 (L)  8.9 - 10.3 mg/dL   Phosphorus 3.2 2.5 - 4.6 mg/dL   Albumin 2.7 (L) 3.5 - 5.0 g/dL   GFR calc non Af Amer 56 (L) >60 mL/min   GFR  calc Af Amer >60 >60 mL/min    Comment: (NOTE) The eGFR has been calculated using the CKD EPI equation. This calculation has not been validated in all clinical situations. eGFR's persistently <60 mL/min signify possible Chronic Kidney Disease.    Anion gap 10 5 - 15  Osmolality     Status: Abnormal   Collection Time: 06/02/17 12:02 PM  Result Value Ref Range   Osmolality 248 (LL) 275 - 295 mOsm/kg    Comment: CRITICAL RESULT CALLED TO, READ BACK BY AND VERIFIED WITH: REPEATED TO VERIFY CALLED TO BILLY HOOKER AT [facility] BY [facility] AT 1734PM CRITICAL RESULT CALLED TO, READ BACK BY AND VERIFIED WITH: A.ARNOLD RN 06/02/2017 1747 JR   Renal function panel     Status: Abnormal   Collection Time: 06/02/17  4:51 PM  Result Value Ref Range   Sodium 117 (LL) 135 - 145 mmol/L    Comment: CRITICAL RESULT CALLED TO, READ BACK BY AND VERIFIED WITH: SUGGS,Z RN [id number] [place],N    Potassium 3.8 3.5 - 5.1 mmol/L   Chloride 80 (L) 101 - 111 mmol/L   CO2 26 22 - 32 mmol/L   Glucose, Bld 109 (H) 65 - 99 mg/dL   BUN 23 (H) 6 - 20 mg/dL   Creatinine, Ser 1.08 (H) 0.44 - 1.00 mg/dL   Calcium 8.2 (L) 8.9 - 10.3 mg/dL   Phosphorus 3.7 2.5 - 4.6 mg/dL   Albumin 3.2 (L) 3.5 - 5.0 g/dL   GFR calc non Af Amer 44 (L) >60 mL/min   GFR calc Af Amer 51 (L) >60 mL/min    Comment: (NOTE) The eGFR has been calculated using the CKD EPI equation. This calculation has not been validated in all clinical situations. eGFR's persistently <60 mL/min signify possible Chronic Kidney Disease.    Anion gap 11 5 - 15  TSH     Status: None   Collection Time: 06/02/17  4:51 PM  Result Value Ref Range   TSH 1.019 0.350 - 4.500 uIU/mL    Comment: Performed by a 3rd Generation assay with a functional sensitivity of <=0.01 uIU/mL.  CBC     Status: Abnormal   Collection Time: 06/03/17   3:23 AM  Result Value Ref Range   WBC 18.6 (H) 4.0 - 10.5 K/uL   RBC 2.73 (L) 3.87 - 5.11 MIL/uL   Hemoglobin 8.9 (L) 12.0 - 15.0 g/dL   HCT 25.4 (L) 36.0 - 46.0 %   MCV 93.0 78.0 - 100.0 fL   MCH 32.6 26.0 - 34.0 pg   MCHC 35.0 30.0 - 36.0 g/dL   RDW 13.9 11.5 - 15.5 %   Platelets 318 150 - 400 K/uL  Heparin level (unfractionated)     Status: None   Collection Time: 06/03/17  3:23 AM  Result Value Ref Range   Heparin Unfractionated 0.36 0.30 - 0.70 IU/mL    Comment:        IF HEPARIN RESULTS ARE BELOW EXPECTED VALUES, AND PATIENT DOSAGE HAS BEEN CONFIRMED, SUGGEST FOLLOW UP TESTING OF ANTITHROMBIN III LEVELS.   Renal function panel     Status: Abnormal   Collection Time: 06/03/17  3:23 AM  Result Value Ref Range   Sodium 119 (LL) 135 - 145 mmol/L    Comment: CRITICAL RESULT CALLED TO, READ BACK BY AND VERIFIED WITH: R MCINTOSH,RN '@0453'  06/03/17 MKELLY    Potassium 4.2 3.5 - 5.1 mmol/L   Chloride 81 (L) 101 - 111 mmol/L   CO2 27  22 - 32 mmol/L   Glucose, Bld 110 (H) 65 - 99 mg/dL   BUN 29 (H) 6 - 20 mg/dL   Creatinine, Ser 1.40 (H) 0.44 - 1.00 mg/dL   Calcium 8.7 (L) 8.9 - 10.3 mg/dL   Phosphorus 4.2 2.5 - 4.6 mg/dL   Albumin 4.0 3.5 - 5.0 g/dL   GFR calc non Af Amer 32 (L) >60 mL/min   GFR calc Af Amer 37 (L) >60 mL/min    Comment: (NOTE) The eGFR has been calculated using the CKD EPI equation. This calculation has not been validated in all clinical situations. eGFR's persistently <60 mL/min signify possible Chronic Kidney Disease.    Anion gap 11 5 - 15    ECG   Sinus rhythm at 91 - anterolateral ST elevation and TWI's (evolving)  - Personally Reviewed  Telemetry   Sinus tachycardia - Personally Reviewed  Radiology    Dg Chest Port 1 View  Result Date: 06/03/2017 CLINICAL DATA:  Initial evaluation for acute shortness of breath. EXAM: PORTABLE CHEST 1 VIEW COMPARISON:  Prior radiograph from 06/10/2017. FINDINGS: Grossly stable cardiomegaly. Mediastinal  silhouette within normal limits. Aortic atherosclerosis. Lungs hypoinflated. Moderate bilateral pleural effusions, progressed from previous. Prominent vascular congestion with interstitial prominence, compatible with pulmonary edema, moderate in nature, and progressed from previous. Superimposed dense retrocardiac left lower lobe opacity may reflect atelectasis or infiltrate. No pneumothorax. No acute osseus abnormality. Prominent degenerative changes about the shoulders. IMPRESSION: 1. Congestive heart failure with moderate diffuse pulmonary edema and bilateral pleural effusions, markedly progressed relative to 06/10/2017. 2. Superimposed dense retrocardiac left lower lobe opacity, likely atelectasis, although infiltrate could be considered in the correct clinical setting. Electronically Signed   By: Jeannine Boga M.D.   On: 06/03/2017 05:35    Cardiac Studies   LV EF: 35% -   40%  ------------------------------------------------------------------- Indications:      CHF - 428.0.  ------------------------------------------------------------------- History:   PMH:   Coronary artery disease.  Stroke.  Risk factors: Hypertension.  ------------------------------------------------------------------- Study Conclusions  - Left ventricle: The mid-apical segments are severely hypokinetic,   while the basal segments contract normally. The appearance is   most suggestive of stress cardiomyopathy (takotsubo syndrome),   but cannot entirely exclude multivessel CAD. The cavity size was   normal. Wall thickness was normal. Systolic function was   moderately reduced. The estimated ejection fraction was in the   range of 35% to 40%. Features are consistent with a pseudonormal   left ventricular filling pattern, with concomitant abnormal   relaxation and increased filling pressure (grade 2 diastolic   dysfunction). - Mitral valve: Calcified annulus. There was mild regurgitation. - Pulmonary  arteries: Systolic pressure was moderately increased.   PA peak pressure: 58 mm Hg (S).  Assessment   Principal Problem:   Acute congestive heart failure (HCC) Active Problems:   Hyponatremia with excess extracellular fluid volume   Weakness   NSTEMI (non-ST elevated myocardial infarction) (HCC)   Acute CHF (congestive heart failure) ([place])   Plan   1. Significantly decompensated overnight - despite frequent diuresis, she is developing progressive renal failure - likely cardiorenal syndrome. No coronary reserve with recent MI. Sodium remains low - she is increasingly unresponsive. Family has chosen a comfort care approach. BIPAP removed - would use morphine for respiratory distress. Anticipate in hospital death - in <24 hours. Discussed with her son at bedside and offered my support and prayers.   Time Spent Directly with Patient:  I have spent a  total of 25 minutes with the patient reviewing hospital notes, telemetry, EKGs, labs and examining the patient as well as establishing an assessment and plan that was discussed personally with the patient. > 50% of time was spent in direct patient care.  Length of Stay:  LOS: 3 days   Pixie Casino, MD, [facility], [place] Director of the [facility] Attending Cardiologist  Direct Dial: [phone]  Fax: [phone]  Website:  [email or web address] 06/03/2017, 8:33 AM

## 2017-06-03 NOTE — Progress Notes (Signed)
Per RN, RT removed PT from BiPAP. RN placed PT on 3 lpm [place].

## 2017-06-03 NOTE — Consult Note (Signed)
Consultation Note Date: 06/03/2017   Patient Name: Ellen Marshall  DOB: 1926-01-22  MRN: [id number]  Age / Sex: 83 y.o., female  PCP: Josetta Huddle, MD Referring Physician: Rosita Fire, MD  Reason for Consultation: Terminal Care  HPI/Patient Profile: 83 y.o. female  with past medical history of angina, CHF, CVA, anxiety, HTN, admitted on 05/23/2017 with increasing shortness of breath. Workup revealed increasing troponin, pulmonary edema, NSTEMI, hyponatremia with excess fluid, acute on chronic CHF. She has decompensated during admission, last night having possible STEMI and requiring BIPAP, renal function worsening and not diuresing. Per discussion with primary team patient and family have opted for comfort measures only.   Clinical Assessment and Goals of Care: Met with patient's son and his wife at patient's bedside.  Patient in bed, wheezing, brow furrowed. Using accessory muscles for breathing.  She has just received a dose of morphine. She smiles when her son rubs her head. She denies pain.  Discussed with son transition to comfort care for his mother. He states she had a very difficult night last night, he felt like she was suffocating and she couldn't die peacefully. His GOC for her is to remain comfortable and to die peacefully- he notes this would be her wish. He does not have preference as to location of her death. He understands that his mom may die in hours to days.   Primary Decision Maker NEXT OF KIN - patient's son    SUMMARY OF RECOMMENDATIONS -Transition to full comfort measures -Initiate morphine infusion 75m/hr with 151mbolus q1550mas needed for SOB -Robinul prn for excessive secretions -Lorazepam prn for anxiety or SOB not relieved by morphine -Do not titrate oxygen up, do not check o2 sats, give morphine bolus or lorazepam for SOB or signs of discomfort -Comfort measures     Code Status/Advance Care Planning:  DNR  Palliative Prophylaxis:   Frequent Pain Assessment  Additional Recommendations (Limitations, Scope, Preferences):  Full Comfort Care  Prognosis:    Hours - Days  Discharge Planning: Anticipated Hospital Death  Primary Diagnoses: Present on Admission: . [place]STEMI (non-ST elevated myocardial infarction) [place] Hyponatremia with excess extracellular fluid volume   I have reviewed the medical record, interviewed the patient and family, and examined the patient. The following aspects are pertinent.  Past Medical History:  Diagnosis Date  . Chest pain   . Coronary artery disease   . GERD (gastroesophageal reflux disease)   . Hypertension   . Shortness of breath    laying down  . [facility]  Social History   Socioeconomic History  . Marital status: Widowed    Spouse name: None  . Number of children: None  . Years of education: None  . Highest education level: None  Social Needs  . Financial resource strain: None  . Food insecurity - worry: None  . Food insecurity - inability: None  . Transportation needs - medical: None  . Transportation needs - non-medical: None  Occupational History  . None  Tobacco Use  .  Smoking status: Never Smoker  . Smokeless tobacco: Never Used  Substance and Sexual Activity  . Alcohol use: No    Comment: none in 20 years - 08/12/13  . Drug use: No  . Sexual activity: None  Other Topics Concern  . None  Social History Narrative  . None   Family History  Problem Relation Age of Onset  . Pancreatic cancer Mother   . CAD Mother   . Heart attack Mother   . CAD Father   . Heart attack Father   . CAD Sister   . Hypertension Sister   . CAD Brother   . Heart attack Sister   . Heart attack Brother    Scheduled Meds: . furosemide  60 mg Intravenous Q8H  . latanoprost  1 drop Both Eyes QHS  . metoprolol tartrate  25 mg Oral BID  . pantoprazole  40 mg Oral BID  . sodium chloride  flush  3 mL Intravenous Q12H  . timolol  1 drop Both Eyes Daily   Continuous Infusions: . sodium chloride     PRN Meds:.sodium chloride, acetaminophen, ALPRAZolam, ipratropium-albuterol, LORazepam, morphine injection, nitroGLYCERIN, ondansetron (ZOFRAN) IV, sodium chloride flush, traZODone Medications Prior to Admission:  Prior to Admission medications   Medication Sig Start Date End Date Taking? Authorizing Provider  acetaminophen (TYLENOL) 650 MG CR tablet Take 1,300 mg by mouth 3 (three) times daily as needed for pain.   Yes [provider]  ALPRAZolam (XANAX) 0.25 MG tablet Take 0.25 mg by mouth 2 (two) times daily. As needed for anxiety. Takes at 2pm and bedtime   Yes [provider]  amLODipine (NORVASC) 5 MG tablet Take 5 mg by mouth daily.   Yes [provider]  aspirin EC 81 MG tablet Take 81 mg daily by mouth.   Yes [provider]  clopidogrel (PLAVIX) 75 MG tablet Take 75 mg by mouth daily after lunch.    Yes [provider]  escitalopram (LEXAPRO) 20 MG tablet Take 20 mg by mouth every morning.   Yes [provider]  ferrous sulfate 325 (65 FE) MG tablet Take 325 mg by mouth 2 (two) times daily with a meal.   Yes [provider]  folic acid (FOLVITE) 355 MCG tablet Take 800 mcg by mouth daily.   Yes [provider]  furosemide (LASIX) 20 MG tablet Take 1 tablet by mouth every Monday, Wednesday and Friday 03/21/17  Yes Belva Crome, MD  isosorbide mononitrate (IMDUR) 120 MG 24 hr tablet TAKE 1 TABLET DAILY 05/19/16  Yes Richardson Dopp T, PA-C  losartan (COZAAR) 100 MG tablet take one tablet (100 mg ) daily 01/27/16  Yes [provider]  metoprolol (LOPRESSOR) 50 MG tablet Take 25 mg by mouth 2 (two) times daily.   Yes [provider]  nitroGLYCERIN (NITROSTAT) 0.4 MG SL tablet Place 0.4 mg under the tongue every 5 (five) minutes as needed. As needed for chest pain.   Yes [provider]   pantoprazole (PROTONIX) 40 MG tablet Take 1 tablet (40 mg total) by mouth 2 (two) times daily. 12/12/16  Yes Aline August, MD  RANEXA 500 MG 12 hr tablet TAKE 1 TABLET TWICE A DAY 05/10/17  Yes Belva Crome, MD  saccharomyces boulardii (FLORASTOR) 250 MG capsule Take 250 mg by mouth daily.    Yes [provider]  timolol (TIMOPTIC-XR) 0.5 % ophthalmic gel-forming Place 1 drop into both eyes daily.   Yes [provider]  travoprost, benzalkonium, (TRAVATAN) 0.004 % ophthalmic solution Place 1 drop into both eyes at bedtime.   Yes [provider]  traZODone (DESYREL) 50 MG tablet Take 150 mg by mouth at bedtime.   Yes [provider]   Allergies  Allergen Reactions  . Adhesive [Tape] Other (See Comments)    Burning, Itching.  . Amoxicillin Diarrhea    Has patient had a PCN reaction causing immediate rash, facial/tongue/throat swelling, SOB or lightheadedness with hypotension: No Has patient had a PCN reaction causing severe rash involving mucus membranes or skin necrosis:No Did patient had a PCN reaction that required hospitalization:No Has patient had a PCN reaction occurring within the last 10 years: Yes If all of the above answers are "NO", then may proceed with Cephalosporin use.   . Pregabalin     Weakness, diarrhea   . Simvastatin     Myalgias   . Sulfa Antibiotics Itching   Review of Systems  Physical Exam  Vital Signs: BP 112/63 (BP Location: Right Arm)   Pulse (!) 122   Temp 97.7 F (36.5 C) (Oral)   Resp 16   Ht 5' 3.5" (1.613 m)   Wt 60.3 kg (132 lb 15 oz)   SpO2 (!) 87%   BMI 23.18 kg/m  Pain Assessment: PAINAD POSS *See Group Information*: S-Acceptable,Sleep, easy to arouse Pain Score: Asleep   SpO2: SpO2: (!) 87 % O2 Device:SpO2: (!) 87 % O2 Flow Rate: .O2 Flow Rate (L/min): 4 L/min  IO: Intake/output summary:   Intake/Output Summary (Last 24 hours) at 06/03/2017 1239 Last data filed at 06/03/2017 0900 Gross per  24 hour  Intake 632 ml  Output 320 ml  Net 312 ml    LBM: Last BM Date: 05/30/17 Baseline Weight: Weight: 59 kg (130 lb) Most recent weight: Weight: 60.3 kg (132 lb 15 oz)     Palliative Assessment/Data: PPS: 10%     Thank you for this consult. Palliative medicine will continue to follow and assist as needed.   Time In: 1200 Time Out: 1320 Time Total:80 minutes Greater than 50%  of this time was spent counseling and coordinating care related to the above assessment and plan.  Signed by: Mariana Kaufman, AGNP-C Palliative Medicine    Please contact Palliative Medicine Team phone at [phone] for questions and concerns.  For individual provider: See Shea Evans

## 2017-06-03 NOTE — Progress Notes (Signed)
PROGRESS NOTE    Ellen Marshall  SWN:[id number] DOB: September 16, 1925 DOA: 06/03/2017 PCP: Josetta Huddle, MD   Brief Narrative: 83 year old female with history of chronic angina, chronic congestive heart failure, prior stroke who lives in assisted living facility presented to the hospital with shortness of breath for about 5 days prior to admission.  Patient was found to have EKG changes and elevated troponin consistent with ACS and acute on chronic CHF.  Assessment & Plan:   #Acute combined systolic and diastolic congestive heart failure: Patient with elevated BNP, acute pulmonary edema, shortness of breath, echo with EF of 35-40% and grade 2 diastolic dysfunction with Tokotsubo syndrome.  Comfort care  #Goals of care discussion: Patient clearly not responding with IV diuretics.  She has worsening respiratory status requiring BiPAP overnight.  Minimal urine output with hypotension.  She has now acute kidney injury likely cardiorenal syndrome.  Hyponatremia not improved with IV Lasix and albumin.  Given patient's age and comorbidities I do not think patient is a candidate for renal replacement therapy.  I have discussed the above findings with the patient and her son at bedside.  Family and patient want to make decision to do comfort measures.  I discontinued most of the medications and added Ativan and IV morphine as a part of supportive care.  Palliative care consulted and discussed with Dr. Smitty Pluck.  # NSTEMI: No aggressive measure.  Evaluated by cardiologist #Hypervolemic hyponatremia in the setting of CHF: Not improving with IV Lasix, albumin.  Not a candidate for replacement therapy.  Now comfort measures. #Leukocytosis: Likely stress related.  No sign of infection.  Continue to monitor. #History of a stroke:  #History of hypertension:  #Acute kidney injury likely cardiorenal syndrome.  DVT prophylaxis: Comfort Code Status: DNR Family Communication: Had a long discussion with the patient's son at  bedside disposition Plan: Transfer to regular floor    Consultants:   Cardiologist  Palliative care  Procedures: Echo Antimicrobials: None  Subjective: Seen and examined at bedside.  Overnight event noted.  Had respiratory distress requiring BiPAP.  Not a great response with Lasix with worsening chest x-ray finding.  Denied chest pain.  Has mild shortness of breath.  No nausea vomiting.  Objective: Vitals:   06/03/17 0645 06/03/17 0700 06/03/17 0800 06/03/17 0900  BP:  (!) 99/58 (!) 120/54 (!) 111/49  Pulse:   (!) 111 (!) 115  Resp:  (!) 28 14 (!) 22  Temp:   98.1 F (36.7 C)   TempSrc:   Axillary   SpO2:  94% 94% (!) 89%  Weight: 60.3 kg (132 lb 15 oz)     Height:        Intake/Output Summary (Last 24 hours) at 06/03/2017 1002 Last data filed at 06/03/2017 0900 Gross per 24 hour  Intake 676 ml  Output 470 ml  Net 206 ml   Filed Weights   06/01/17 0630 06/02/17 0700 06/03/17 0645  Weight: 62.2 kg (137 lb 2 oz) 61 kg (134 lb 7.7 oz) 60.3 kg (132 lb 15 oz)    Examination:  General exam: Ill looking female lying in bed with BiPAP Respiratory system: Bilateral coarse crackles Cardiovascular system: Tachycardic, regular, S1-S2 normal. Gastrointestinal system: Abdomen soft, nontender, nondistended.  Bowel sounds positive. Central nervous system: Alert awake and following commands. Extremities: Symmetric 5 x 5 power. Skin: No rashes, lesions or ulcers    Data Reviewed: I have personally reviewed following labs and imaging studies  CBC: Recent Labs  Lab 06/04/2017 1202 06/01/17  3299 06/02/17 0351 06/03/17 0323  WBC 23.3* 21.8* 19.2* 18.6*  NEUTROABS 20.9*  --   --   --   HGB 12.7 11.6* 10.4* 8.9*  HCT 35.7* 33.5* 29.1* 25.4*  MCV 93.7 93.8 92.1 93.0  PLT 512* 436* 353 242   Basic Metabolic Panel: Recent Labs  Lab 06/01/17 0534 06/02/17 0351 06/02/17 1202 06/02/17 1651 06/03/17 0323  NA 121* 118* 117* 117* 119*  K 4.0 4.0 3.8 3.8 4.2  CL 87* 83*  82* 80* 81*  CO2 27 26 25 26 27   GLUCOSE 151* 118* 110* 109* 110*  BUN 13 18 19  23* 29*  CREATININE 0.95 0.99 0.89 1.08* 1.40*  CALCIUM 8.5* 8.3* 8.0* 8.2* 8.7*  PHOS  --   --  3.2 3.7 4.2   GFR: Estimated Creatinine Clearance: 23.1 mL/min (A) (by C-G formula based on SCr of 1.4 mg/dL (H)). Liver Function Tests: Recent Labs  Lab 05/17/2017 1202 06/02/17 1202 06/02/17 1651 06/03/17 0323  AST 27  --   --   --   ALT 14  --   --   --   ALKPHOS 58  --   --   --   BILITOT 0.8  --   --   --   PROT 6.7  --   --   --   ALBUMIN 3.6 2.7* 3.2* 4.0   No results for input(s): LIPASE, AMYLASE in the last 168 hours. No results for input(s): AMMONIA in the last 168 hours. Coagulation Profile: No results for input(s): INR, PROTIME in the last 168 hours. Cardiac Enzymes: Recent Labs  Lab 05/29/2017 1729 05/18/2017 2358 06/01/17 0534  TROPONINI 1.40* 1.41* 1.20*   BNP (last 3 results) No results for input(s): PROBNP in the last 8760 hours. HbA1C: No results for input(s): HGBA1C in the last 72 hours. CBG: No results for input(s): GLUCAP in the last 168 hours. Lipid Profile: No results for input(s): CHOL, HDL, LDLCALC, TRIG, CHOLHDL, LDLDIRECT in the last 72 hours. Thyroid Function Tests: Recent Labs    06/02/17 1651  TSH 1.019   Anemia Panel: No results for input(s): VITAMINB12, FOLATE, FERRITIN, TIBC, IRON, RETICCTPCT in the last 72 hours. Sepsis Labs: No results for input(s): PROCALCITON, LATICACIDVEN in the last 168 hours.  Recent Results (from the past 240 hour(s))  MRSA PCR Screening     Status: None   Collection Time: 06/09/2017  5:48 PM  Result Value Ref Range Status   MRSA by PCR NEGATIVE NEGATIVE Final    Comment:        The GeneXpert MRSA Assay (FDA approved for NASAL specimens only), is one component of a comprehensive MRSA colonization surveillance program. It is not intended to diagnose MRSA infection nor to guide or monitor treatment for MRSA infections.           Radiology Studies: Dg Chest Port 1 View  Result Date: 06/03/2017 CLINICAL DATA:  Initial evaluation for acute shortness of breath. EXAM: PORTABLE CHEST 1 VIEW COMPARISON:  Prior radiograph from 06/04/2017. FINDINGS: Grossly stable cardiomegaly. Mediastinal silhouette within normal limits. Aortic atherosclerosis. Lungs hypoinflated. Moderate bilateral pleural effusions, progressed from previous. Prominent vascular congestion with interstitial prominence, compatible with pulmonary edema, moderate in nature, and progressed from previous. Superimposed dense retrocardiac left lower lobe opacity may reflect atelectasis or infiltrate. No pneumothorax. No acute osseus abnormality. Prominent degenerative changes about the shoulders. IMPRESSION: 1. Congestive heart failure with moderate diffuse pulmonary edema and bilateral pleural effusions, markedly progressed relative to 05/26/2017. 2. Superimposed dense  retrocardiac left lower lobe opacity, likely atelectasis, although infiltrate could be considered in the correct clinical setting. Electronically Signed   By: Jeannine Boga M.D.   On: 06/03/2017 05:35        Scheduled Meds: . furosemide  60 mg Intravenous Q8H  . latanoprost  1 drop Both Eyes QHS  . metoprolol tartrate  25 mg Oral BID  . pantoprazole  40 mg Oral BID  . sodium chloride flush  3 mL Intravenous Q12H  . timolol  1 drop Both Eyes Daily   Continuous Infusions: . sodium chloride       LOS: 3 days    Tatiana Courter Tanna Furry, MD [facility] Pager [phone]  If 7PM-7AM, please contact night-coverage [email or web address] Password [email or web address] 06/03/2017, 10:02 AM

## 2017-06-03 NOTE — Progress Notes (Signed)
Pt's daughter-in-law was bedside when I arrived. She was very pleasant and thankful for the visit. She said her husband, pt's son, had gone home to shower and change. Pt's daughter and I talked about her family and mostly her church family. She spoke of how her mother-in-law enjoyed attending the class her son taught at church. Family is prepared for the transition of their family member, whenever that time comes; they take comfort in their origin of faith. Please page if additional support is needed. [place], [place]   06/03/17 1100  Clinical Encounter Type  Visited With Family

## 2017-06-04 DIAGNOSIS — I5021 Acute systolic (congestive) heart failure: Secondary | ICD-10-CM

## 2017-06-04 NOTE — Progress Notes (Signed)
Daily Progress Note   Patient Name: Ellen Marshall       Date: 06/04/2017 DOB: 09-06-1925  Age: 83 y.o. MRN#: [id number] Attending Physician: Rosita Fire, MD Primary Care Physician: Josetta Huddle, MD Admit Date: 05/27/2017  Reason for Consultation/Follow-up: Terminal Care  Subjective: Patient in bed, asleep. Breathing improved from evaluation yesterday. Daughter in law at bedside. We discuss referral to residential hospice. Daughter in law tells me she and husband are comfortable with this move. Patient does not open eyes, but smiles when I speak her name and stroke her forehead. Appears comfortable. She is not eating or drinking.  ROS  Length of Stay: 4  Current Medications: Scheduled Meds:  . pantoprazole  40 mg Oral BID  . timolol  1 drop Both Eyes Daily    Continuous Infusions: . morphine 1 mg/hr (06/03/17 1339)    PRN Meds: acetaminophen, antiseptic oral rinse, glycopyrrolate **OR** glycopyrrolate **OR** glycopyrrolate, haloperidol **OR** haloperidol **OR** haloperidol lactate, LORazepam **OR** LORazepam **OR** LORazepam, morphine injection, morphine, ondansetron **OR** ondansetron (ZOFRAN) IV, polyvinyl alcohol, traZODone  Physical Exam  Constitutional: She appears well-developed. No distress.  Pulmonary/Chest: Effort normal. She has wheezes. She has rales.  Neurological:  Smiles but does not open eyes to voice  Nursing note and vitals reviewed.           Vital Signs: BP (!) 147/77 (BP Location: Right Arm)   Pulse (!) 122   Temp 97.8 F (36.6 C) (Oral)   Resp 20   Ht 5' 3.5" (1.613 m)   Wt 60.3 kg (132 lb 15 oz)   SpO2 94%   BMI 23.18 kg/m  SpO2: SpO2: 94 % O2 Device: O2 Device: Not Delivered O2 Flow Rate: O2 Flow Rate (L/min): 4 L/min  Intake/output  summary:   Intake/Output Summary (Last 24 hours) at 06/04/2017 1354 Last data filed at 06/03/2017 2300 Gross per 24 hour  Intake 8.35 ml  Output 120 ml  Net -111.65 ml   LBM: Last BM Date: (PTA) Baseline Weight: Weight: 59 kg (130 lb) Most recent weight: Weight: 60.3 kg (132 lb 15 oz)       Palliative Assessment/Data: PPS: 10%      Patient Active Problem List   Diagnosis Date Noted  . AKI (acute kidney injury) ([place])   . Goals of care, counseling/discussion   .  Terminal care   . Palliative care by specialist   . NSTEMI (non-ST elevated myocardial infarction) ([place]) 06/01/2017  . Acute congestive heart failure ([place]) 05/29/2017  . Acute CHF (congestive heart failure) ([place]) 06/15/2017  . Food impaction of esophagus 12/11/2016  . Pain   . Shortness of breath   . Stroke ([place])   . Colitis   . Chronic diastolic heart failure ([place]) 03/25/2014  . Carotid stenosis 03/25/2014  . Bilateral carotid bruits 02/16/2014  . Chest pain   . CAP (community acquired pneumonia) 08/13/2013  . Syncope 08/12/2013  . Bradycardia 08/12/2013  . Hematoma of right parietal scalp 08/12/2013  . HTN (hypertension) 08/12/2013  . Acute renal failure ([place]) 08/12/2013  . GI bleed 10/10/2011  . Anemia due to blood loss, acute 10/10/2011  . Hyponatremia with excess extracellular fluid volume 10/09/2011  . Diarrhea 10/09/2011  . Vomiting 10/09/2011  . Weakness 10/09/2011  . Leukocytosis 10/09/2011  . Thrombocythemia ([place]) 10/09/2011  . GERD (gastroesophageal reflux disease) 10/09/2011  . CAD (coronary artery disease) 10/09/2011    Palliative Care Assessment & Plan   Patient Profile:  83 y.o. female  with past medical history of angina, CHF, CVA, anxiety, HTN, admitted on 05/29/2017 with increasing shortness of breath. Workup revealed increasing troponin, pulmonary edema, NSTEMI, hyponatremia with excess fluid, acute on chronic CHF. She has decompensated during admission, last night having possible  STEMI and requiring BIPAP, renal function worsening and not diuresing. Per discussion with primary team patient and family have opted for comfort measures only.   Assessment/Recommendations/Plan   Referral to residential hospice has been placed by primary team  Continue current comfort measures  Goals of Care and Additional Recommendations:  Limitations on Scope of Treatment: Full Comfort Care  Code Status:  DNR  Prognosis:   Hours - Days  Discharge Planning:  Hospice facility  Care plan was discussed with patient's daughter in law.  Thank you for allowing the Palliative Medicine Team to assist in the care of this patient.   Time In: 1300 Time Out: 1345 Total Time 45 mins Prolonged Time Billed No      Greater than 50%  of this time was spent counseling and coordinating care related to the above assessment and plan.  Mariana Kaufman, AGNP-C Palliative Medicine   Please contact Palliative Medicine Team phone at [phone] for questions and concerns.

## 2017-06-04 NOTE — Clinical Social Work Note (Signed)
Clinical Social Work Assessment  Patient Details  Name: Ellen Marshall MRN: [id number] Date of Birth: 04/17/1926  Date of referral:  06/04/17               Reason for consult:  End of Life/Hospice                Permission sought to share information with:  Family Supports, Chartered certified accountant granted to share information::     Name::     son Insurance underwriter::  Hospice agencies  Relationship::     Contact Information:     Housing/Transportation Living arrangements for the past 2 months:  [place] of Information:  Adult Children Patient Interpreter Needed:  None Criminal Activity/Legal Involvement Pertinent to Current Situation/Hospitalization:  No - Comment as needed Significant Relationships:  Adult Children, Warehouse manager Lives with:  Facility Resident Do you feel safe going back to the place where you live?  Yes(however needs have changed) Need for family participation in patient care:  Yes (Comment)(son primary decision maker)  Care giving concerns:  Pt admitted from [place]. Now full comfort care as pt did not respond to interventions and family wants to "make sure she's out of pain and comfortable"   Social Worker assessment / plan:  CSW consulted to assist with pt transitioning to residential hospice care. Discussed with pt's son, he states after thorough discussion with provider, family/pt have opted to end aggressive care attempts and pursue full comfort care. Family preference is for hospice in residential setting- prefer [facility] due to proximity to their home. Son states, "we are familiar with hospice here and in the counties around [place] and feel very good with any hospice care, we think very highly of them." CSW made referral and will follow to assist.   Employment status:  Retired Insurance underwriter information:  Medicare PT Recommendations:  Not assessed at this time Information / Referral to community resources:  Other  (Comment Required)(residential hospice)  Patient/Family's Response to care:  Engaged and appreciative  Patient/Family's Understanding of and Emotional Response to Diagnosis, Current Treatment, and Prognosis:  Family demonstrates thorough understanding of prognosis and plan. Seem emotionally well-adjusted and peaceful about their transferring to hospice.   Emotional Assessment Appearance:  Appears stated age Attitude/Demeanor/Rapport:    Affect (typically observed):    Orientation:    Alcohol / Substance use:    Psych involvement (Current and /or in the community):     Discharge Needs  Concerns to be addressed:  (end of life decisions) Readmission within the last 30 days:  Yes Current discharge risk:  None Barriers to Discharge:  No Barriers Identified   Nila Nephew, LCSW 06/04/2017, 1:41 PM  [phone]

## 2017-06-04 NOTE — Progress Notes (Signed)
PROGRESS NOTE    Ellen Marshall  GGY:[id number] DOB: [birth year] DOA: 06/12/2017 PCP: Josetta Huddle, MD   Brief Narrative: 83 year old female with history of chronic angina, chronic congestive heart failure, prior stroke who lives in assisted living facility presented to the hospital with shortness of breath for about 5 days prior to admission.  Patient was found to have EKG changes and elevated troponin consistent with ACS and acute on chronic CHF.  Assessment & Plan:   #Acute combined systolic and diastolic congestive heart failure: Patient with elevated BNP, acute pulmonary edema, shortness of breath, echo with EF of 35-40% and grade 2 diastolic dysfunction with Tokotsubo syndrome.  Patient did not respond with IV diuretics associated with renal failure and worsening respiratory status.  After discussion with the patient's family and patient decided to pursue comfort care only.  Patient is now on morphine drip and very comfortable.  Patient's son is happy with the care and discussed about likely discharge to residential hospice.  Social worker consulted.  #Goals of care discussion: Now comfort care only. # NSTEMI: No aggressive measure.  Evaluated by cardiologist #Hypervolemic hyponatremia in the setting of CHF: Not improving with IV Lasix, albumin.  Not a candidate for replacement therapy.  Now comfort measures. #Leukocytosis: Likely stress related.  No sign of infection.  Continue to monitor. #History of a stroke:  #History of hypertension:  #Acute kidney injury likely cardiorenal syndrome.  DVT prophylaxis: Comfort Code Status: DNR Family Communication: Had a long discussion with the patient's son at bedside disposition Plan: Transfer to regular floor    Consultants:   Cardiologist  Palliative care  Procedures: Echo Antimicrobials: None  Subjective: Seen and examined at bedside.  Patient is comfortable, no new event.  Objective: Vitals:   06/03/17 1000 06/03/17 1100  06/03/17 1138 06/04/17 0430  BP: (!) 116/49 (!) 100/55 112/63 (!) 147/77  Pulse: (!) 127 (!) 123 (!) 122 (!) 122  Resp: (!) 26 13 16 20   Temp:   97.7 F (36.5 C) 97.8 F (36.6 C)  TempSrc:   Oral Oral  SpO2: (!) 88% (!) 89% (!) 87% 94%  Weight:      Height:        Intake/Output Summary (Last 24 hours) at 06/04/2017 1051 Last data filed at 06/03/2017 2300 Gross per 24 hour  Intake 8.35 ml  Output 120 ml  Net -111.65 ml   Filed Weights   06/01/17 0630 06/02/17 0700 06/03/17 0645  Weight: 62.2 kg (137 lb 2 oz) 61 kg (134 lb 7.7 oz) 60.3 kg (132 lb 15 oz)    Examination:  General exam: Looks comfortable Respiratory system: Bilateral coarse crackles Cardiovascular system: Tachycardic, regular, S1-S2 normal. Gastrointestinal system: Abdomen soft, nontender, nondistended.  Bowel sounds positive. Central nervous system: Alert awake   Skin: No rashes, lesions or ulcers    Data Reviewed: I have personally reviewed following labs and imaging studies  CBC: Recent Labs  Lab 05/25/2017 1202 06/01/17 0534 06/02/17 0351 06/03/17 0323  WBC 23.3* 21.8* 19.2* 18.6*  NEUTROABS 20.9*  --   --   --   HGB 12.7 11.6* 10.4* 8.9*  HCT 35.7* 33.5* 29.1* 25.4*  MCV 93.7 93.8 92.1 93.0  PLT 512* 436* 353 035   Basic Metabolic Panel: Recent Labs  Lab 06/01/17 0534 06/02/17 0351 06/02/17 1202 06/02/17 1651 06/03/17 0323  NA 121* 118* 117* 117* 119*  K 4.0 4.0 3.8 3.8 4.2  CL 87* 83* 82* 80* 81*  CO2 27 26 25 26  27  GLUCOSE 151* 118* 110* 109* 110*  BUN 13 18 19  23* 29*  CREATININE 0.95 0.99 0.89 1.08* 1.40*  CALCIUM 8.5* 8.3* 8.0* 8.2* 8.7*  PHOS  --   --  3.2 3.7 4.2   GFR: Estimated Creatinine Clearance: 23.1 mL/min (A) (by C-G formula based on SCr of 1.4 mg/dL (H)). Liver Function Tests: Recent Labs  Lab 05/29/2017 1202 06/02/17 1202 06/02/17 1651 06/03/17 0323  AST 27  --   --   --   ALT 14  --   --   --   ALKPHOS 58  --   --   --   BILITOT 0.8  --   --   --   PROT  6.7  --   --   --   ALBUMIN 3.6 2.7* 3.2* 4.0   No results for input(s): LIPASE, AMYLASE in the last 168 hours. No results for input(s): AMMONIA in the last 168 hours. Coagulation Profile: No results for input(s): INR, PROTIME in the last 168 hours. Cardiac Enzymes: Recent Labs  Lab 06/06/2017 1729 06/02/2017 2358 06/01/17 0534  TROPONINI 1.40* 1.41* 1.20*   BNP (last 3 results) No results for input(s): PROBNP in the last 8760 hours. HbA1C: No results for input(s): HGBA1C in the last 72 hours. CBG: No results for input(s): GLUCAP in the last 168 hours. Lipid Profile: No results for input(s): CHOL, HDL, LDLCALC, TRIG, CHOLHDL, LDLDIRECT in the last 72 hours. Thyroid Function Tests: Recent Labs    06/02/17 1651  TSH 1.019   Anemia Panel: No results for input(s): VITAMINB12, FOLATE, FERRITIN, TIBC, IRON, RETICCTPCT in the last 72 hours. Sepsis Labs: No results for input(s): PROCALCITON, LATICACIDVEN in the last 168 hours.  Recent Results (from the past 240 hour(s))  MRSA PCR Screening     Status: None   Collection Time: 06/01/2017  5:48 PM  Result Value Ref Range Status   MRSA by PCR NEGATIVE NEGATIVE Final    Comment:        The GeneXpert MRSA Assay (FDA approved for NASAL specimens only), is one component of a comprehensive MRSA colonization surveillance program. It is not intended to diagnose MRSA infection nor to guide or monitor treatment for MRSA infections.          Radiology Studies: Dg Chest Port 1 View  Result Date: 06/03/2017 CLINICAL DATA:  Initial evaluation for acute shortness of breath. EXAM: PORTABLE CHEST 1 VIEW COMPARISON:  Prior radiograph from 06/08/2017. FINDINGS: Grossly stable cardiomegaly. Mediastinal silhouette within normal limits. Aortic atherosclerosis. Lungs hypoinflated. Moderate bilateral pleural effusions, progressed from previous. Prominent vascular congestion with interstitial prominence, compatible with pulmonary edema, moderate in  nature, and progressed from previous. Superimposed dense retrocardiac left lower lobe opacity may reflect atelectasis or infiltrate. No pneumothorax. No acute osseus abnormality. Prominent degenerative changes about the shoulders. IMPRESSION: 1. Congestive heart failure with moderate diffuse pulmonary edema and bilateral pleural effusions, markedly progressed relative to 05/20/2017. 2. Superimposed dense retrocardiac left lower lobe opacity, likely atelectasis, although infiltrate could be considered in the correct clinical setting. Electronically Signed   By: Jeannine Boga M.D.   On: 06/03/2017 05:35        Scheduled Meds: . pantoprazole  40 mg Oral BID  . timolol  1 drop Both Eyes Daily   Continuous Infusions: . morphine 1 mg/hr (06/03/17 1339)     LOS: 4 days    Dron Tanna Furry, MD [facility] Pager [phone]  If 7PM-7AM, please contact night-coverage [email or web address] Password [email or web address] 06/04/2017, 10:51  AM

## 2017-06-16 NOTE — Death Summary Note (Addendum)
Death Summary  Ellen Marshall PYK:[id number] DOB: 1925-10-07 DOA: 06/02/17  PCP: Josetta Huddle, MD  Admit date: 06/02/17 Date of Death: 2017-06-07 Time of Death: 01:25 AM Notification: Josetta Huddle, MD notified of death of 2017/06/07   History of present illness:  83 year old female with history of chronic angina, chronic congestive heart failure, prior stroke who lives in assisted living facility presented to the hospital with shortness of breath for about 5 days prior to admission.  Patient was found to have EKG changes and elevated troponin consistent with ACS and acute on chronic CHF. Patient with no clinical improvement with worsening clinical status.  Did not respond with Lasix.  After discussion with the family, patient on palliative care patient was DNR and comfort care only.  A started on IV morphine and patient passed away last night.  Problem list during hospitalization as below: #Acute combined systolic and diastolic congestive heart failure, with Takotsubo syndrome. #Goals of care discussion:  # NSTEMI #Hypervolemic hyponatremia in the setting of CHF: #Leukocytosis: #History of a stroke:  #History of hypertension:  #Acute kidney injury likely cardiorenal syndrome. # Acute respiratory failure with hypoxia     The results of significant diagnostics from this hospitalization (including imaging, microbiology, ancillary and laboratory) are listed below for reference.    Significant Diagnostic Studies: Dg Chest 2 View  Result Date: 2017-06-02 CLINICAL DATA:  Worsening weakness and shortness of breath over the last week. EXAM: CHEST  2 VIEW COMPARISON:  12/11/2016 FINDINGS: Heart size is normal. Chronic aortic atherosclerosis. There is interstitial and alveolar edema with small amount of bilateral pleural fluid. Findings are most consistent with congestive heart failure. No evidence of focal consolidation or lobar collapse. No significant bone finding. IMPRESSION:  Congestive heart failure with interstitial and early alveolar edema and small effusions. Electronically Signed   By: Nelson Chimes M.D.   On: 06/02/2017 12:37   Dg Esophagus  Result Date: 05/10/2017 CLINICAL DATA:  History of previous food impaction EXAM: ESOPHOGRAM/BARIUM SWALLOW TECHNIQUE: Single contrast examination was performed using  thin barium. FLUOROSCOPY TIME:  Fluoroscopy Time:  1 minutes 24 seconds Radiation Exposure Index (if provided by the fluoroscopic device): 79 mGy Number of Acquired Spot Images: 0 COMPARISON:  None. FINDINGS: The study was begun in the lateral projection to exclude the possibility of aspiration. No penetration or aspiration is seen. The cervical esophagus is unremarkable. There are moderate tertiary contractions in the mid and distal esophagus. A small distal esophageal diverticulum is noted extending toward the right. No hiatal hernia is noted, but there is moderate gastroesophageal reflux at the end the study with the water siphon maneuver. A barium pill was given which did lodge within the distal esophagus and did not pass after additional water and barium were given. This indicates a short segment distal esophageal stricture. IMPRESSION: 1. Barium pill lodges in the distal esophagus consistent with a short segment distal esophageal stricture. This narrowing appears to be at the level of the esophageal diverticulum. 2. Small distal esophageal diverticulum. 3. Moderate tertiary contractions. Electronically Signed   By: Ivar Drape M.D.   On: 05/10/2017 10:51   Dg Chest Port 1 View  Result Date: 06/03/2017 CLINICAL DATA:  Initial evaluation for acute shortness of breath. EXAM: PORTABLE CHEST 1 VIEW COMPARISON:  Prior radiograph from 2017/06/02. FINDINGS: Grossly stable cardiomegaly. Mediastinal silhouette within normal limits. Aortic atherosclerosis. Lungs hypoinflated. Moderate bilateral pleural effusions, progressed from previous. Prominent vascular congestion with  interstitial prominence, compatible with pulmonary edema, moderate in nature, and  progressed from previous. Superimposed dense retrocardiac left lower lobe opacity may reflect atelectasis or infiltrate. No pneumothorax. No acute osseus abnormality. Prominent degenerative changes about the shoulders. IMPRESSION: 1. Congestive heart failure with moderate diffuse pulmonary edema and bilateral pleural effusions, markedly progressed relative to 05/27/2017. 2. Superimposed dense retrocardiac left lower lobe opacity, likely atelectasis, although infiltrate could be considered in the correct clinical setting. Electronically Signed   By: Jeannine Boga M.D.   On: 06/03/2017 05:35    Microbiology: Recent Results (from the past 240 hour(s))  MRSA PCR Screening     Status: None   Collection Time: 06/08/2017  5:48 PM  Result Value Ref Range Status   MRSA by PCR NEGATIVE NEGATIVE Final    Comment:        The GeneXpert MRSA Assay (FDA approved for NASAL specimens only), is one component of a comprehensive MRSA colonization surveillance program. It is not intended to diagnose MRSA infection nor to guide or monitor treatment for MRSA infections.      Labs: Basic Metabolic Panel: Recent Labs  Lab 06/01/17 0534 06/02/17 0351 06/02/17 1202 06/02/17 1651 06/03/17 0323  NA 121* 118* 117* 117* 119*  K 4.0 4.0 3.8 3.8 4.2  CL 87* 83* 82* 80* 81*  CO2 27 26 25 26 27   GLUCOSE 151* 118* 110* 109* 110*  BUN 13 18 19  23* 29*  CREATININE 0.95 0.99 0.89 1.08* 1.40*  CALCIUM 8.5* 8.3* 8.0* 8.2* 8.7*  PHOS  --   --  3.2 3.7 4.2   Liver Function Tests: Recent Labs  Lab 06/12/2017 1202 06/02/17 1202 06/02/17 1651 06/03/17 0323  AST 27  --   --   --   ALT 14  --   --   --   ALKPHOS 58  --   --   --   BILITOT 0.8  --   --   --   PROT 6.7  --   --   --   ALBUMIN 3.6 2.7* 3.2* 4.0   No results for input(s): LIPASE, AMYLASE in the last 168 hours. No results for input(s): AMMONIA in the last 168  hours. CBC: Recent Labs  Lab 05/18/2017 1202 06/01/17 0534 06/02/17 0351 06/03/17 0323  WBC 23.3* 21.8* 19.2* 18.6*  NEUTROABS 20.9*  --   --   --   HGB 12.7 11.6* 10.4* 8.9*  HCT 35.7* 33.5* 29.1* 25.4*  MCV 93.7 93.8 92.1 93.0  PLT 512* 436* 353 318   Cardiac Enzymes: Recent Labs  Lab 05/21/2017 1729 06/09/2017 2358 06/01/17 0534  TROPONINI 1.40* 1.41* 1.20*   D-Dimer No results for input(s): DDIMER in the last 72 hours. BNP: Invalid input(s): POCBNP CBG: No results for input(s): GLUCAP in the last 168 hours. Anemia work up No results for input(s): VITAMINB12, FOLATE, FERRITIN, TIBC, IRON, RETICCTPCT in the last 72 hours. Urinalysis    Component Value Date/Time   COLORURINE YELLOW 05/28/2017 [place] 06/07/2017 1743   LABSPEC 1.008 06/08/2017 1743   PHURINE 7.0 05/25/2017 1743   GLUCOSEU NEGATIVE 06/11/2017 1743   GLUCOSEU NEGATIVE 10/13/2013 1518   HGBUR NEGATIVE 05/27/2017 [place] 05/24/2017 [place] 05/30/2017 1743   PROTEINUR NEGATIVE 06/04/2017 1743   UROBILINOGEN 0.2 07/30/2014 0730   NITRITE NEGATIVE 06/04/2017 1743   LEUKOCYTESUR NEGATIVE 06/10/2017 1743   Sepsis Labs Invalid input(s): PROCALCITONIN,  WBC,  LACTICIDVEN     SIGNED:  Rosita Fire, MD  [facility] June 29, 2017, 12:58 PM  Pager   If 7PM-7AM, please contact night-coverage [email or web address] Password [email or web address]

## 2017-06-16 NOTE — Death Summary Note (Signed)
Patient passed at 0125 this morning.  This nurse and Francisca December pronounced death.

## 2017-06-16 DEATH — deceased
